# Patient Record
Sex: Male | Born: 1945
Health system: Southern US, Community
[De-identification: ages and names within clinical notes are randomized; demographics above are authoritative.]

## PROBLEM LIST (undated history)

## (undated) DIAGNOSIS — I4891 Unspecified atrial fibrillation: Secondary | ICD-10-CM

## (undated) DIAGNOSIS — Z8739 Personal history of other diseases of the musculoskeletal system and connective tissue: Secondary | ICD-10-CM

## (undated) DIAGNOSIS — F1011 Alcohol abuse, in remission: Secondary | ICD-10-CM

## (undated) DIAGNOSIS — F32A Depression, unspecified: Secondary | ICD-10-CM

## (undated) DIAGNOSIS — I499 Cardiac arrhythmia, unspecified: Secondary | ICD-10-CM

## (undated) DIAGNOSIS — J309 Allergic rhinitis, unspecified: Secondary | ICD-10-CM

## (undated) HISTORY — DX: Unspecified atrial fibrillation: I48.91

## (undated) HISTORY — DX: Personal history of other diseases of the musculoskeletal system and connective tissue: Z87.39

## (undated) HISTORY — DX: Allergic rhinitis, unspecified: J30.9

## (undated) HISTORY — DX: Alcohol abuse, in remission: F10.11

---
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
## 1980-08-13 HISTORY — PX: INGUINAL HERNIA REPAIR: SUR1180

## 2013-08-13 HISTORY — PX: CATARACT EXTRACTION BILATERAL W/ ANTERIOR VITRECTOMY: SHX1304

## 2013-09-07 DIAGNOSIS — Z8679 Personal history of other diseases of the circulatory system: Secondary | ICD-10-CM | POA: Insufficient documentation

## 2015-05-18 DIAGNOSIS — N400 Enlarged prostate without lower urinary tract symptoms: Secondary | ICD-10-CM | POA: Insufficient documentation

## 2015-07-23 DIAGNOSIS — R4182 Altered mental status, unspecified: Secondary | ICD-10-CM | POA: Insufficient documentation

## 2015-07-25 DIAGNOSIS — G9340 Encephalopathy, unspecified: Secondary | ICD-10-CM | POA: Insufficient documentation

## 2015-08-09 DIAGNOSIS — J90 Pleural effusion, not elsewhere classified: Secondary | ICD-10-CM | POA: Insufficient documentation

## 2015-08-09 DIAGNOSIS — E8770 Fluid overload, unspecified: Secondary | ICD-10-CM | POA: Insufficient documentation

## 2015-08-09 DIAGNOSIS — D500 Iron deficiency anemia secondary to blood loss (chronic): Secondary | ICD-10-CM | POA: Insufficient documentation

## 2015-12-05 ENCOUNTER — Telehealth: Payer: Self-pay | Admitting: Cardiology

## 2015-12-05 NOTE — Telephone Encounter (Signed)
Patient came to office and signed Release to obtain records from [facility] for upcoming appointment with Dr Antoine PocheHochrein.  Faxed to [phone] on 12/05/15. lp

## 2015-12-22 ENCOUNTER — Ambulatory Visit (INDEPENDENT_AMBULATORY_CARE_PROVIDER_SITE_OTHER): Payer: Medicare Other | Admitting: Family Medicine

## 2015-12-22 ENCOUNTER — Encounter: Payer: Self-pay | Admitting: Family Medicine

## 2015-12-22 VITALS — BP 107/66 | HR 79 | Temp 98.4°F | Ht 69.3 in | Wt 154.2 lb

## 2015-12-22 DIAGNOSIS — J309 Allergic rhinitis, unspecified: Secondary | ICD-10-CM | POA: Diagnosis not present

## 2015-12-22 DIAGNOSIS — Z0000 Encounter for general adult medical examination without abnormal findings: Secondary | ICD-10-CM | POA: Diagnosis not present

## 2015-12-22 DIAGNOSIS — I4891 Unspecified atrial fibrillation: Secondary | ICD-10-CM

## 2015-12-22 DIAGNOSIS — Z8739 Personal history of other diseases of the musculoskeletal system and connective tissue: Secondary | ICD-10-CM | POA: Diagnosis not present

## 2015-12-22 DIAGNOSIS — I10 Essential (primary) hypertension: Secondary | ICD-10-CM | POA: Insufficient documentation

## 2015-12-22 MED ORDER — LEVOCETIRIZINE DIHYDROCHLORIDE 5 MG PO TABS: 5.0000 mg | ORAL_TABLET | Freq: Every evening | ORAL | Status: DC

## 2015-12-22 MED ORDER — DIGOXIN 125 MCG PO TABS: 125.0000 ug | ORAL_TABLET | Freq: Every day | ORAL | Status: DC

## 2015-12-22 MED ORDER — ASPIRIN EC 81 MG PO TBEC: 81.0000 mg | DELAYED_RELEASE_TABLET | Freq: Every day | ORAL | Status: DC

## 2015-12-22 NOTE — Progress Notes (Signed)
Subjective:  Cameron Patton is a 70 y.o. male who presents to the [facility] today to establish care. He has a chief complaint of rhinorrhea and left ear "pressure."   HPI:  Rhinorrhea Present for several months. Has a history of allergies to grass. Has not tried any medications. Tried flonase in the past and did not like it. Does not want to start it again.  Atrial Fibrillation Diagnosed in 1998. Has been on digoxin and warfarin in the past but recently stopped warfarin 7 months ago. Has not been on aspirin. No history of stroke or heart failure.  Arthritis / Degenerative Disc Disease Located in his back. Unsure of location. Takes tylenol as needed.   Social Relocated to [place] from [place] 4 months ago after significant hospital stay with UTI leading to sepsis. Went to rehab for a few months afterwards. Brother is living in [place].   ROS: All systems reviewed and are negative  PMH:  The following were reviewed and entered/updated in epic: Past Medical History  Diagnosis Date  . Atrial fibrillation (HCC)   . Hx of degenerative disc disease   . Allergic rhinitis    Patient Active Problem List   Diagnosis Date Noted  . Hx of degenerative disc disease 12/22/2015  . Atrial fibrillation (HCC) 12/22/2015  . Allergic rhinitis 12/22/2015  . Healthcare maintenance 12/22/2015   Past Surgical History  Procedure Laterality Date  . Cataract extraction bilateral w/ anterior vitrectomy Bilateral 2015  . Inguinal hernia repair Right 1982    Family History  Problem Relation Age of Onset  . Stroke Brother     Medications- reviewed and updated Current Outpatient Prescriptions  Medication Sig Dispense Refill  . aspirin EC 81 MG tablet Take 1 tablet (81 mg total) by mouth daily.    . digoxin (LANOXIN) 0.125 MG tablet Take 1 tablet (125 mcg total) by mouth daily.    Marland Kitchen levocetirizine (XYZAL) 5 MG tablet Take 1 tablet (5 mg total) by mouth every evening. 30 tablet 5   No current  facility-administered medications for this visit.   Allergies-reviewed and updated No Known Allergies  Social History   Social History  . Marital Status: Legally Separated    Spouse Name: N/A  . Number of Children: N/A  . Years of Education: N/A   Social History Main Topics  . Smoking status: Never Smoker   . Smokeless tobacco: None  . Alcohol Use: None  . Drug Use: None  . Sexual Activity: Not Asked   Other Topics Concern  . None   Social History Narrative   Relocated to [place] from [place] 08/2015 months ago after significant hospital stay with UTI leading to sepsis. Went to rehab for a few months afterwards. Brother is living in [place].      Objective:  Physical Exam: BP 107/66 mmHg  Pulse 79  Temp(Src) 98.4 F (36.9 C) (Oral)  Ht 5' 9.3" (1.76 m)  Wt 154 lb 3.2 oz (69.945 kg)  BMI 22.58 kg/m2  SpO2 100%  Gen: NAD, resting comfortably HEENT: Scant amount of clear mucus in nares bilaterally, TMs clear bilaterally CV: Irregularly irregular, no murmurs appreciated Pulm: NWOB, CTAB with no crackles, wheezes, or rhonchi GI: Normal bowel sounds present. Soft, Nontender, Nondistended. MSK: no edema, cyanosis, or clubbing noted Skin: warm, dry Neuro: grossly normal, moves all extremities Psych: Normal affect and thought content  Assessment/Plan:  Allergic rhinitis Patient deferred intranasal sprays. Will start xyzal.   Hx of degenerative disc disease Continue  tylenol as needed.  Atrial fibrillation (HCC) Rate controlled today. Chads2vasc score of 1. Instructed patient to start daily aspirin. He has not been on any antiplatelets or anticoagulation for the last 7 months. Will continue prior dose of digoxin until patient has follow up with cardiology.  Healthcare maintenance Will not check any labs today as patient reports recent blood work.   Also asked the patient to bring all medication bottles to next visit and requested that he obtain the records from  his old PCP for review. Will not refill any medications today as the patient does not have doses recorded and did not bring his medication bottles.   Will need lipids, A1c, CBC, CMP, HCV testing at next visit. May also be due for colonoscopy, pneumonia vaccine, tdap, and shingles vaccines. Will await records.   Katina Degreealeb M. Jimmey RalphParker, MD [facility] Resident PGY-2 12/22/2015 4:51 PM

## 2015-12-22 NOTE — Patient Instructions (Addendum)
We will start xyzal today for your runny nose. This will also help with your ear.   Please bring all of your pill bottles to your next visit.   Please ask your old doctor to send their records.  Please come back in 3 months or sooner if you need anything else.  Take care,  Dr Jimmey RalphParker

## 2015-12-22 NOTE — Assessment & Plan Note (Signed)
Rate controlled today. Chads2vasc score of 1. Instructed patient to start daily aspirin. He has not been on any antiplatelets or anticoagulation for the last 7 months. Will continue prior dose of digoxin until patient has follow up with cardiology.

## 2015-12-22 NOTE — Assessment & Plan Note (Signed)
Patient deferred intranasal sprays. Will start xyzal.

## 2015-12-22 NOTE — Assessment & Plan Note (Signed)
Will not check any labs today as patient reports recent blood work.   Also asked the patient to bring all medication bottles to next visit and requested that he obtain the records from his old PCP for review. Will not refill any medications today as the patient does not have doses recorded and did not bring his medication bottles.   Will need lipids, A1c, CBC, CMP, HCV testing at next visit. May also be due for colonoscopy, pneumonia vaccine, tdap, and shingles vaccines. Will await records.

## 2015-12-22 NOTE — Assessment & Plan Note (Signed)
Continue tylenol as needed.

## 2016-01-04 ENCOUNTER — Encounter: Payer: Self-pay | Admitting: Cardiology

## 2016-01-04 ENCOUNTER — Telehealth: Payer: Self-pay | Admitting: Cardiology

## 2016-01-04 NOTE — Telephone Encounter (Signed)
Received records from [facility] for appointment on 01/10/16 with Dr Antoine PocheHochrein.  [facility] for Dr Hochrein's schedule on 01/10/16. lp

## 2016-01-10 ENCOUNTER — Ambulatory Visit (INDEPENDENT_AMBULATORY_CARE_PROVIDER_SITE_OTHER): Payer: Medicare Other | Admitting: Cardiology

## 2016-01-10 ENCOUNTER — Encounter: Payer: Self-pay | Admitting: Cardiology

## 2016-01-10 VITALS — BP 98/66 | HR 79 | Ht 69.0 in | Wt 160.0 lb

## 2016-01-10 DIAGNOSIS — I4891 Unspecified atrial fibrillation: Secondary | ICD-10-CM | POA: Diagnosis not present

## 2016-01-10 DIAGNOSIS — I482 Chronic atrial fibrillation, unspecified: Secondary | ICD-10-CM

## 2016-01-10 NOTE — Patient Instructions (Signed)
Medication Instructions:  Continue Current Medications  Labwork: NONE  Testing/Procedures: NONE  Follow-Up: Your physician recommends that you schedule a follow-up appointment in: 1 Month   Any Other Special Instructions Will Be Listed Below (If Applicable).   If you need a refill on your cardiac medications before your next appointment, please call your pharmacy.

## 2016-01-10 NOTE — Progress Notes (Addendum)
Cardiology Office Note   Date:  01/10/2016   ID:  Cameron HopeSidney L Vangieson, DOB 1946-07-20, MRN [id number]  PCP:  Jacquiline Doealeb Parker, MD  Cardiologist:   Rollene RotundaJames Kyleigha Markert, MD   Chief Complaint  Patient presents with  . Atrial Fibrillation      History of Present Illness: Cameron Patton is a 70 y.o. male who presents for Evaluation of atrial fibrillation. The patient is moving from [place]. He has had a long history of atrial fibrillation. I did receive some records from his cardiologist there. He was on warfarin for years. There were no apparent complications with this. He was on pindolol in the past. He did have some moderate mitral regurgitation on echocardiogram in September of last year. He had biatrial enlargement. He was apparently hospitalized in December with sepsis from a urinary tract infection. This sounds like an exceptionally complicated hospitalization. I don't have any of the details of this. He was discharged to rehabilitation without anticoagulation been on only aspirin since then. He is now living in a nursing home here. His brother does live here. He is starting to do some of his own exercising having completed extensive physical therapy. He lost 40 pounds with his illness. He has weakness and some gait difficulties.  From a cardiac standpoint he denies any symptoms. The patient denies any new symptoms such as chest discomfort, neck or arm discomfort. There has been no new shortness of breath, PND or orthopnea. There have been no reported palpitations, presyncope or syncope.  He has not reported any falling. He does not have any weight gain or edema. Since being at his nursing home he's been taking digoxin. At some point he was actually told to stop taking aspirin. He doesn't have any bleeding issues. He doesn't know of any contraindication to anticoagulation.    Past Medical History  Diagnosis Date  . Atrial fibrillation (HCC)   . Hx of degenerative disc disease   . Allergic rhinitis    . History of ETOH abuse     Quit December last year.     Past Surgical History  Procedure Laterality Date  . Cataract extraction bilateral w/ anterior vitrectomy Bilateral 2015  . Inguinal hernia repair Right 1982     Current Outpatient Prescriptions  Medication Sig Dispense Refill  . aspirin EC 81 MG tablet Take 1 tablet (81 mg total) by mouth daily.    . digoxin (LANOXIN) 0.125 MG tablet Take 1 tablet (125 mcg total) by mouth daily.    Marland Kitchen. levocetirizine (XYZAL) 5 MG tablet Take 1 tablet (5 mg total) by mouth every evening. 30 tablet 5   No current facility-administered medications for this visit.    Allergies:   Review of patient's allergies indicates no known allergies.    Social History:  The patient  reports that he has never smoked. He does not have any smokeless tobacco history on file.   Family History:  The patient's family history includes Alcoholism in his mother; Cancer in his brother; Stroke in his brother.    ROS:  Please see the history of present illness.   Otherwise, review of systems are positive for none.   All other systems are reviewed and negative.    PHYSICAL EXAM: VS:  BP 98/66 mmHg  Pulse 79  Ht 5\' 9"  (1.753 m)  Wt 160 lb (72.576 kg)  BMI 23.62 kg/m2 , BMI Body mass index is 23.62 kg/(m^2). GENERAL:  Somewhat frail appearing but in no distress HEENT:  Pupils  equal round and reactive, fundi not visualized, oral mucosa unremarkable NECK:  No jugular venous distention, waveform within normal limits, carotid upstroke brisk and symmetric, no bruits, no thyromegaly LYMPHATICS:  No cervical, inguinal adenopathy LUNGS:  Clear to auscultation bilaterally BACK:  No CVA tenderness CHEST:  Unremarkable HEART:  PMI not displaced or sustained,S1 and S2 within normal limits, no S3, no clicks, no rubs, soft short apical systolic murmur, no diastolic murmurs, irregular ABD:  Flat, positive bowel sounds normal in frequency in pitch, no bruits, no rebound, no  guarding, no midline pulsatile mass, no hepatomegaly, no splenomegaly EXT:  2 plus pulses throughout, no edema, no cyanosis no clubbing SKIN:  No rashes no nodules NEURO:  Cranial nerves II through XII grossly intact, motor grossly intact throughout PSYCH:  Cognitively intact, oriented to person place and time    EKG:  EKG is ordered today. The ekg ordered today demonstrates atrial fibrillation, rate 79, axis within normal limits, intervals within normal limits, poor anterior right progression.   Recent Labs: No results found for requested labs within last 365 days.    Lipid Panel No results found for: CHOL, TRIG, HDL, CHOLHDL, VLDL, LDLCALC, LDLDIRECT    Wt Readings from Last 3 Encounters:  01/10/16 160 lb (72.576 kg)  12/22/15 154 lb 3.2 oz (69.945 kg)      Other studies Reviewed: Additional studies/ records that were reviewed today include: Cardiology office records ([place]). Review of the above records demonstrates:  Please see elsewhere in the note.     ASSESSMENT AND PLAN:  ATRIAL FIB:  Cameron Patton has a CHA2DS2 - VASc score of 1 with a risk of stroke of 1.3%.   He and I will have further discussions about indications for anticoagulation.  I would like to see more of his past medical history to understand any contraindications to anticoagulation or other thromboembolic risk that might alter his score. Apparently  Jacquiline Doe, MD has requested records and I will wait to see if these get scanned in and schedule the patient to be seen in follow-up prior to making any medication changes. For now he can remain on the meds as listed.   INSOMNIA: He said this is a significant issue. I will send a message to his primary provider to see if he will renew his Ambien or consider other therapy. He says over-the-counter therapy is not helping.    MR:  I suspect that this is mild and I will follow up with repeat echo in the future.    Current medicines are reviewed at length  with the patient today.  The patient does not have concerns regarding medicines.  The following changes have been made:  no change  Labs/ tests ordered today include:   Orders Placed This Encounter  Procedures  . EKG 12-Lead     Disposition:   FU with me in one month.      Signed, Rollene Rotunda, MD  01/10/2016 5:12 PM    [facility]

## 2016-02-08 NOTE — Progress Notes (Signed)
Cardiology Office Note   Date:  02/09/2016   ID:  Cameron Patton, DOB Jun 26, 1946, MRN [id number]  PCP:  Cameron Doealeb Parker, MD  Cardiologist:   Cameron RotundaJames Milena Liggett, MD   Chief Complaint  Patient presents with  . Atrial Fibrillation      History of Present Illness: Cameron Patton is a 70 y.o. male who presents for Evaluation of atrial fibrillation. The patient is moving from [place]. He has had a long history of atrial fibrillation. I did receive some records from his cardiologist there. He was on warfarin for years. There were no apparent complications with this. He was on pindolol in the past. He did have some moderate mitral regurgitation on echocardiogram in September of last year. He had biatrial enlargement. He was apparently hospitalized in December with sepsis from a urinary tract infection.   This was a complicated history.  He was in the hospital at the end of last year. He came out of the hospital without his chronic warfarin which been taking under the care of his cardiologist. He doesn't recall the details and doesn't know that there was any bleeding contraindication. I requested these records. However, I did not get these yet.  Since I last saw him he has done well.  The patient denies any new symptoms such as chest discomfort, neck or arm discomfort. There has been no new shortness of breath, PND or orthopnea. There have been no reported palpitations, presyncope or syncope.  He is working out at [employer].     Past Medical History  Diagnosis Date  . Atrial fibrillation (HCC)   . Hx of degenerative disc disease   . Allergic rhinitis   . History of ETOH abuse     Quit December last year.     Past Surgical History  Procedure Laterality Date  . Cataract extraction bilateral w/ anterior vitrectomy Bilateral 2015  . Inguinal hernia repair Right 1982     Current Outpatient Prescriptions  Medication Sig Dispense Refill  . aspirin EC 81 MG tablet Take 1 tablet (81 mg total)  by mouth daily.    . digoxin (LANOXIN) 0.125 MG tablet Take 1 tablet (125 mcg total) by mouth daily.    Marland Kitchen. levocetirizine (XYZAL) 5 MG tablet Take 1 tablet (5 mg total) by mouth every evening. 30 tablet 5  . rivaroxaban (XARELTO) 20 MG TABS tablet Take 1 tablet (20 mg total) by mouth daily with supper. 30 tablet 11   No current facility-administered medications for this visit.    Allergies:   Review of patient's allergies indicates no known allergies.     ROS:  Please see the history of present illness.   Otherwise, review of systems are positive for none.   All other systems are reviewed and negative.    PHYSICAL EXAM: VS:  BP 122/76 mmHg  Pulse 65  Ht 5\' 9"  (1.753 m)  Wt 155 lb (70.308 kg)  BMI 22.88 kg/m2 , BMI Body mass index is 22.88 kg/(m^2). GENERAL:  Somewhat frail appearing but in no distress NECK:  No jugular venous distention, waveform within normal limits, carotid upstroke brisk and symmetric, no bruits, no thyromegaly LUNGS:  Clear to auscultation bilaterally BACK:  No CVA tenderness CHEST:  Unremarkable HEART:  PMI not displaced or sustained,S1 and S2 within normal limits, no S3, no clicks, no rubs, soft short apical systolic murmur, no diastolic murmurs, irregular ABD:  Flat, positive bowel sounds normal in frequency in pitch, no bruits, no rebound, no  guarding, no midline pulsatile mass, no hepatomegaly, no splenomegaly EXT:  2 plus pulses throughout, no edema, no cyanosis no clubbing PSYCH:  Cognitively intact, oriented to person place and time, tearful   EKG:  EKG is not ordered today.   Recent Labs: No results found for requested labs within last 365 days.    Lipid Panel No results found for: CHOL, TRIG, HDL, CHOLHDL, VLDL, LDLCALC, LDLDIRECT    Wt Readings from Last 3 Encounters:  02/09/16 155 lb (70.308 kg)  01/10/16 160 lb (72.576 kg)  12/22/15 154 lb 3.2 oz (69.945 kg)      Other studies Reviewed: Additional studies/ records that were reviewed  today include:  None Review of the above records demonstrates:     ASSESSMENT AND PLAN:  ATRIAL FIB:  [name] has a CHA2DS2 - VASc score of 1 with a risk of stroke of 1.3%. I did not get the records from his hospitalization. We had a discussion about the risks benefits of anticoagulation and there is certainly room for patient preference and shared decision making year. He would prefer blood thinners as well although cost is a consideration. I'll write a prescription for Xarelto and we can see if this is affordable for him.  INSOMNIA: He is clearly depressed about his brother and living in [place].  He doesn't like his living situation.  This is probably affecting sleep. I have asked him to get an appointment with his primary provider to discuss this.  MR:  I suspect that this is mild and I will follow up with repeat echo in Dec.    Current medicines are reviewed at length with the patient today.  The patient does not have concerns regarding medicines.  The following changes have been made:  no change  Labs/ tests ordered today include:   Orders Placed This Encounter  Procedures  . ECHOCARDIOGRAM COMPLETE     Disposition:   FU with me in Dec.      Signed, Cameron RotundaJames Ladarion Munyon, MD  02/09/2016 5:32 PM    [facility]

## 2016-02-09 ENCOUNTER — Ambulatory Visit (INDEPENDENT_AMBULATORY_CARE_PROVIDER_SITE_OTHER): Payer: Medicare Other | Admitting: Cardiology

## 2016-02-09 ENCOUNTER — Encounter: Payer: Self-pay | Admitting: Cardiology

## 2016-02-09 ENCOUNTER — Telehealth: Payer: Self-pay | Admitting: Cardiology

## 2016-02-09 VITALS — BP 122/76 | HR 65 | Ht 69.0 in | Wt 155.0 lb

## 2016-02-09 DIAGNOSIS — I482 Chronic atrial fibrillation, unspecified: Secondary | ICD-10-CM

## 2016-02-09 MED ORDER — RIVAROXABAN 20 MG PO TABS: 20.0000 mg | ORAL_TABLET | Freq: Every day | ORAL | Status: DC

## 2016-02-09 NOTE — Telephone Encounter (Signed)
F/u Message   pt states he received a call and was returning the call. Please call back to discuss

## 2016-02-09 NOTE — Patient Instructions (Signed)
Medication Instructions:  START Xarelto 20 mg daily  Labwork: NONE  Testing/Procedures: Your physician has requested that you have an echocardiogram in December. Echocardiography is a painless test that uses sound waves to create images of your heart. It provides your doctor with information about the size and shape of your heart and how well your heart's chambers and valves are working. This procedure takes approximately one hour. There are no restrictions for this procedure.  Follow-Up: Your physician wants you to follow-up in: December after Echo. You will receive a reminder letter in the mail two months in advance. If you don't receive a letter, please call our office to schedule the follow-up appointment.   Any Other Special Instructions Will Be Listed Below (If Applicable).   If you need a refill on your cardiac medications before your next appointment, please call your pharmacy.

## 2016-02-09 NOTE — Telephone Encounter (Signed)
Faxed [facility] per Dr Antoine PocheHochrein.  Faxed 02/09/16 to [phone].

## 2016-02-09 NOTE — Telephone Encounter (Signed)
Spoke with patient. Apologized that I was unable to locate who may have tried to reach out to him. He has an MD appointment today 02/08/17 @ 2:30pm of which he is aware.

## 2016-02-21 NOTE — Telephone Encounter (Signed)
Left message to call back  

## 2016-02-21 NOTE — Telephone Encounter (Signed)
New message   Pt wants Dr.hochrein to change his current blood thinner Xarelto prescription to Warfrin to 3 mg   It is cheaper at the pharmacy  [place] [phone]

## 2016-02-22 ENCOUNTER — Telehealth: Payer: Self-pay

## 2016-02-22 NOTE — Telephone Encounter (Signed)
Left message to call back  

## 2016-02-22 NOTE — Telephone Encounter (Signed)
Patient called about getting a new prescription for previous blood thinner warfarin 3 mg.  Hochrein recently changed it to xarelto but the patient states that it is too expensvie.  [facility] would [facility]. Patient will call back Thursday 7/13.

## 2016-02-23 MED ORDER — WARFARIN SODIUM 3 MG PO TABS: 3.0000 mg | ORAL_TABLET | Freq: Every day | ORAL | Status: DC

## 2016-02-23 NOTE — Telephone Encounter (Signed)
Routed to clinical pharmacy staff for assistance

## 2016-02-23 NOTE — Telephone Encounter (Signed)
Another telephone note has been started concerning same issues and routed to clinical pharmacists. This encounter will be closed.

## 2016-02-23 NOTE — Telephone Encounter (Signed)
LMOM for patient - need to set up INR visit or determine who is going to follow him.

## 2016-02-28 ENCOUNTER — Ambulatory Visit (INDEPENDENT_AMBULATORY_CARE_PROVIDER_SITE_OTHER): Payer: Medicare Other | Admitting: Pharmacist Clinician (PhC)/ Clinical Pharmacy Specialist

## 2016-02-28 DIAGNOSIS — Z7901 Long term (current) use of anticoagulants: Secondary | ICD-10-CM

## 2016-02-28 DIAGNOSIS — I4891 Unspecified atrial fibrillation: Secondary | ICD-10-CM | POA: Diagnosis not present

## 2016-02-28 LAB — POCT INR: INR: 1.3

## 2016-03-14 ENCOUNTER — Ambulatory Visit (INDEPENDENT_AMBULATORY_CARE_PROVIDER_SITE_OTHER): Payer: Medicare Other | Admitting: Pharmacist Clinician (PhC)/ Clinical Pharmacy Specialist

## 2016-03-14 DIAGNOSIS — Z7901 Long term (current) use of anticoagulants: Secondary | ICD-10-CM

## 2016-03-14 DIAGNOSIS — I4891 Unspecified atrial fibrillation: Secondary | ICD-10-CM | POA: Diagnosis not present

## 2016-03-14 LAB — POCT INR: INR: 4.3

## 2016-04-11 ENCOUNTER — Ambulatory Visit (INDEPENDENT_AMBULATORY_CARE_PROVIDER_SITE_OTHER): Payer: Medicare Other | Admitting: Pharmacist Clinician (PhC)/ Clinical Pharmacy Specialist

## 2016-04-11 DIAGNOSIS — Z7901 Long term (current) use of anticoagulants: Secondary | ICD-10-CM | POA: Diagnosis not present

## 2016-04-11 DIAGNOSIS — I4891 Unspecified atrial fibrillation: Secondary | ICD-10-CM

## 2016-04-11 LAB — POCT INR: INR: 2

## 2016-04-26 ENCOUNTER — Other Ambulatory Visit: Payer: Self-pay | Admitting: *Deleted

## 2016-04-26 MED ORDER — WARFARIN SODIUM 3 MG PO TABS: ORAL_TABLET | ORAL | 1 refills | Status: DC

## 2016-05-09 ENCOUNTER — Ambulatory Visit (INDEPENDENT_AMBULATORY_CARE_PROVIDER_SITE_OTHER): Payer: Medicare Other | Admitting: Pharmacist Clinician (PhC)/ Clinical Pharmacy Specialist

## 2016-05-09 DIAGNOSIS — Z7901 Long term (current) use of anticoagulants: Secondary | ICD-10-CM

## 2016-05-09 DIAGNOSIS — I4891 Unspecified atrial fibrillation: Secondary | ICD-10-CM

## 2016-05-09 LAB — POCT INR: INR: 3.3

## 2016-06-08 ENCOUNTER — Ambulatory Visit (INDEPENDENT_AMBULATORY_CARE_PROVIDER_SITE_OTHER): Payer: Medicare Other | Admitting: Pharmacist Clinician (PhC)/ Clinical Pharmacy Specialist

## 2016-06-08 DIAGNOSIS — Z7901 Long term (current) use of anticoagulants: Secondary | ICD-10-CM | POA: Diagnosis not present

## 2016-06-08 DIAGNOSIS — I4891 Unspecified atrial fibrillation: Secondary | ICD-10-CM | POA: Diagnosis not present

## 2016-06-08 LAB — POCT INR: INR: 3.7

## 2016-07-09 ENCOUNTER — Ambulatory Visit (INDEPENDENT_AMBULATORY_CARE_PROVIDER_SITE_OTHER): Payer: Medicare Other | Admitting: Pharmacist Clinician (PhC)/ Clinical Pharmacy Specialist

## 2016-07-09 DIAGNOSIS — Z7901 Long term (current) use of anticoagulants: Secondary | ICD-10-CM

## 2016-07-09 DIAGNOSIS — I4891 Unspecified atrial fibrillation: Secondary | ICD-10-CM

## 2016-07-09 LAB — POCT INR: INR: 3.4

## 2016-07-24 ENCOUNTER — Other Ambulatory Visit: Payer: Self-pay | Admitting: Cardiology

## 2016-08-16 ENCOUNTER — Ambulatory Visit (HOSPITAL_COMMUNITY): Payer: Medicare Other | Attending: Cardiovascular Disease

## 2016-08-16 ENCOUNTER — Other Ambulatory Visit: Payer: Self-pay

## 2016-08-16 DIAGNOSIS — I517 Cardiomegaly: Secondary | ICD-10-CM | POA: Insufficient documentation

## 2016-08-16 DIAGNOSIS — I482 Chronic atrial fibrillation, unspecified: Secondary | ICD-10-CM

## 2016-08-20 ENCOUNTER — Ambulatory Visit (INDEPENDENT_AMBULATORY_CARE_PROVIDER_SITE_OTHER): Payer: Medicare Other | Admitting: Pharmacist Clinician (PhC)/ Clinical Pharmacy Specialist

## 2016-08-20 ENCOUNTER — Telehealth: Payer: Self-pay | Admitting: Cardiology

## 2016-08-20 DIAGNOSIS — Z7901 Long term (current) use of anticoagulants: Secondary | ICD-10-CM

## 2016-08-20 DIAGNOSIS — I4891 Unspecified atrial fibrillation: Secondary | ICD-10-CM

## 2016-08-20 LAB — POCT INR: INR: 2.6

## 2016-08-20 NOTE — Telephone Encounter (Signed)
Patient was seen in office today.  Did not call him after that.  Patient aware that someone LM on his VM, but he wasn't sure whom.

## 2016-08-20 NOTE — Telephone Encounter (Signed)
New Message ° ° ° °Returning your call  °

## 2016-09-19 ENCOUNTER — Other Ambulatory Visit: Payer: Self-pay | Admitting: Cardiology

## 2016-10-05 ENCOUNTER — Ambulatory Visit (INDEPENDENT_AMBULATORY_CARE_PROVIDER_SITE_OTHER): Payer: Medicare Other | Admitting: Pharmacist

## 2016-10-05 DIAGNOSIS — Z7901 Long term (current) use of anticoagulants: Secondary | ICD-10-CM

## 2016-10-05 DIAGNOSIS — I4891 Unspecified atrial fibrillation: Secondary | ICD-10-CM

## 2016-10-05 LAB — POCT INR: INR: 2.1

## 2016-11-08 ENCOUNTER — Other Ambulatory Visit: Payer: Self-pay | Admitting: Cardiology

## 2016-11-16 ENCOUNTER — Ambulatory Visit (INDEPENDENT_AMBULATORY_CARE_PROVIDER_SITE_OTHER): Payer: Medicare Other | Admitting: Pharmacist

## 2016-11-16 DIAGNOSIS — I4891 Unspecified atrial fibrillation: Secondary | ICD-10-CM

## 2016-11-16 DIAGNOSIS — Z7901 Long term (current) use of anticoagulants: Secondary | ICD-10-CM | POA: Diagnosis not present

## 2016-11-16 LAB — POCT INR: INR: 2.3

## 2017-01-16 ENCOUNTER — Ambulatory Visit (INDEPENDENT_AMBULATORY_CARE_PROVIDER_SITE_OTHER): Payer: Medicare Other | Admitting: Pharmacist Clinician (PhC)/ Clinical Pharmacy Specialist

## 2017-01-16 DIAGNOSIS — I4891 Unspecified atrial fibrillation: Secondary | ICD-10-CM

## 2017-01-16 DIAGNOSIS — Z7901 Long term (current) use of anticoagulants: Secondary | ICD-10-CM | POA: Diagnosis not present

## 2017-01-16 LAB — POCT INR: INR: 1.5

## 2017-01-18 ENCOUNTER — Other Ambulatory Visit: Payer: Self-pay | Admitting: *Deleted

## 2017-01-18 ENCOUNTER — Other Ambulatory Visit: Payer: Self-pay | Admitting: Cardiology

## 2017-01-18 NOTE — Telephone Encounter (Signed)
Rx(s) sent to pharmacy electronically.  

## 2017-01-21 ENCOUNTER — Telehealth: Payer: Self-pay | Admitting: Cardiology

## 2017-01-21 NOTE — Telephone Encounter (Signed)
error 

## 2017-01-22 ENCOUNTER — Encounter: Payer: Self-pay | Admitting: Physician Assistant

## 2017-01-22 ENCOUNTER — Ambulatory Visit (INDEPENDENT_AMBULATORY_CARE_PROVIDER_SITE_OTHER): Payer: Medicare Other | Admitting: Physician Assistant

## 2017-01-22 ENCOUNTER — Other Ambulatory Visit: Payer: Self-pay | Admitting: Family Medicine

## 2017-01-22 VITALS — BP 104/66 | HR 61 | Ht 69.0 in | Wt 176.0 lb

## 2017-01-22 DIAGNOSIS — I482 Chronic atrial fibrillation: Secondary | ICD-10-CM | POA: Diagnosis not present

## 2017-01-22 DIAGNOSIS — I4821 Permanent atrial fibrillation: Secondary | ICD-10-CM

## 2017-01-22 DIAGNOSIS — Z7901 Long term (current) use of anticoagulants: Secondary | ICD-10-CM

## 2017-01-22 MED ORDER — DIGOXIN 125 MCG PO TABS
125.0000 ug | ORAL_TABLET | Freq: Every day | ORAL | 11 refills | Status: DC
Start: 2017-01-22 — End: 2019-07-15

## 2017-01-22 NOTE — Patient Instructions (Signed)
Medication Instructions: No changes    Follow-Up: Your physician wants you to follow-up in: one year with Dr. Hochrein. You will receive a reminder letter in the mail two months in advance. If you don't receive a letter, please call our office to schedule the follow-up appointment.   If you need a refill on your cardiac medications before your next appointment, please call your pharmacy.   

## 2017-01-22 NOTE — Progress Notes (Signed)
Cardiology Office Note   Date:  01/22/2017   ID:  Cameron Patton, DOB September 23, 1945, MRN [id number]  PCP:  Ardith DarkParker, Caleb M, MD  Cardiologist:  Dr. Antoine PocheHochrein, 02/08/2016  Theodore DemarkBarrett, Sharena Dibenedetto, PA-C   Chief Complaint  Patient presents with  . Medication Refill    History of Present Illness: Cameron Patton is a 71 y.o. 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 Pt was on warfarin  Cameron HopeSidney L Freilich presents for cardiology follow up.  He is rarely aware of the atrial fib. He only notices it occasionally at night when he puts his hand on his chest.   He exercises regularly at [employer], walks occasionally as well.  He never gets chest pain or SOB with exertion. He does not check his heart rate with exertion.   He has not had presyncope or syncope.  He does not get LE edema, orthopnea or PND.  He follows at the coumadin clinic here, his INR was low recently, but normally is stable at 2-3.   He drinks 5-6 beers most days of the week. He recognizes the need to decrease the amount/frequency.   Past Medical History:  Diagnosis Date  . Allergic rhinitis   . Atrial fibrillation (HCC)   . History of ETOH abuse    Quit December last year.   Marland Kitchen. Hx of degenerative disc disease     Past Surgical History:  Procedure Laterality Date  . CATARACT EXTRACTION BILATERAL W/ ANTERIOR VITRECTOMY Bilateral 2015  . INGUINAL HERNIA REPAIR Right 1982    Current Outpatient Prescriptions  Medication Sig Dispense Refill  . digoxin (DIGOX) 0.125 MG tablet Take 1 tablet (125 mcg total) by mouth daily. PLEASE CONTACT OFFICE FOR ADDITIONAL REFILLS 15 tablet 0  . warfarin (COUMADIN) 3 MG tablet Take 1/2 to 1 tablet by mouth daily as directed by coumadin clinic 90 tablet 1   No current facility-administered medications for this visit.     Allergies:   Patient has no known allergies.    Social History:  The patient  reports that he has never  smoked. He has never used smokeless tobacco.   Family History:  The patient's family history includes Alcoholism in his mother; Cancer in his brother; Stroke in his brother.    ROS:  Please see the history of present illness. All other systems are reviewed and negative.    PHYSICAL EXAM: VS:  BP 104/66   Pulse 61   Ht 5\' 9"  (1.753 m)   Wt 176 lb (79.8 kg)   BMI 25.99 kg/m  , BMI Body mass index is 25.99 kg/m. GEN: Well nourished, well developed, male in no acute distress  HEENT: normal for age  Neck: no JVD, no carotid bruit, no masses Cardiac: Irreg R&R; soft murmur, no rubs, or gallops Respiratory:  clear to auscultation bilaterally, normal work of breathing GI: soft, nontender, nondistended, + BS MS: no deformity or atrophy; no edema; distal pulses are 2+ in all 4 extremities   Skin: warm and Patton, no rash Neuro:  Strength and sensation are intact Psych: euthymic mood, full affect   EKG:  EKG is ordered today. The ekg ordered today demonstrates atrial fibrillation, controlled ventricular rate at 61. No acute ischemic changes, normal intervals  ECHO: 08/16/2016 - Left ventricle: The cavity size was normal. There was mild   concentric hypertrophy. Systolic function was normal. The   estimated ejection fraction was in  the range of 55% to 60%. Wall   motion was normal; there were no regional wall motion   abnormalities. Left ventricular diastolic function parameters   were normal. - Mitral valve: There was mild regurgitation. - Left atrium: The atrium was severely dilated. - Right ventricle: The cavity size was normal. Wall thickness was   normal. Systolic function was normal. - Right atrium: The atrium was moderately dilated. - Atrial septum: No defect or patent foramen ovale was identified   by color flow Doppler. - Tricuspid valve: There was mild regurgitation. - Pulmonary arteries: Systolic pressure was within the normal   range. PA peak pressure: 32 mm Hg  (S).  Recent Labs: No results found for requested labs within last 8760 hours.    Lipid Panel No results found for: CHOL, TRIG, HDL, CHOLHDL, VLDL, LDLCALC, LDLDIRECT   Wt Readings from Last 3 Encounters:  01/22/17 176 lb (79.8 kg)  02/09/16 155 lb (70.3 kg)  01/10/16 160 lb (72.6 kg)     Other studies Reviewed: Additional studies/ records that were reviewed today include: Office notes and testing.  ASSESSMENT AND PLAN:  1.  Permanent atrial fibrillation: His rate is controlled. He is having no symptoms indicating any side effects from digoxin. Continue current therapy.  2. Chronic anticoagulation with warfarin: Continue this. He is having no bleeding issues. This is managed by the Coumadin clinic here.   Current medicines are reviewed at length with the patient today.  The patient does not have concerns regarding medicines.  The following changes have been made:  no change  Labs/ tests ordered today include:  No orders of the defined types were placed in this encounter.    Disposition:   FU with Dr. Antoine Poche in a year  Signed, Leanna Battles  01/22/2017 11:52 AM    [facility] Phone: [phone]; Fax: [phone]  This note was written with the assistance of speech recognition software. Please excuse any transcriptional errors.

## 2017-02-27 ENCOUNTER — Ambulatory Visit (INDEPENDENT_AMBULATORY_CARE_PROVIDER_SITE_OTHER): Payer: Medicare Other | Admitting: Pharmacist

## 2017-02-27 DIAGNOSIS — I4891 Unspecified atrial fibrillation: Secondary | ICD-10-CM

## 2017-02-27 DIAGNOSIS — Z7901 Long term (current) use of anticoagulants: Secondary | ICD-10-CM | POA: Diagnosis not present

## 2017-02-27 LAB — POCT INR: INR: 1.7

## 2017-04-17 ENCOUNTER — Ambulatory Visit (INDEPENDENT_AMBULATORY_CARE_PROVIDER_SITE_OTHER): Payer: Medicare Other | Admitting: Pharmacist

## 2017-04-17 DIAGNOSIS — Z7901 Long term (current) use of anticoagulants: Secondary | ICD-10-CM | POA: Diagnosis not present

## 2017-04-17 DIAGNOSIS — I4891 Unspecified atrial fibrillation: Secondary | ICD-10-CM | POA: Diagnosis not present

## 2017-04-17 LAB — POCT INR: INR: 3.8

## 2017-06-05 ENCOUNTER — Ambulatory Visit (INDEPENDENT_AMBULATORY_CARE_PROVIDER_SITE_OTHER): Payer: Medicare Other | Admitting: Pharmacist Clinician (PhC)/ Clinical Pharmacy Specialist

## 2017-06-05 DIAGNOSIS — Z7901 Long term (current) use of anticoagulants: Secondary | ICD-10-CM

## 2017-06-05 DIAGNOSIS — I482 Chronic atrial fibrillation, unspecified: Secondary | ICD-10-CM

## 2017-06-05 DIAGNOSIS — I4891 Unspecified atrial fibrillation: Secondary | ICD-10-CM

## 2017-06-05 LAB — POCT INR: INR: 2

## 2017-07-12 ENCOUNTER — Other Ambulatory Visit: Payer: Self-pay | Admitting: Cardiology

## 2017-07-29 ENCOUNTER — Ambulatory Visit (INDEPENDENT_AMBULATORY_CARE_PROVIDER_SITE_OTHER): Payer: Medicare Other | Admitting: Pharmacist

## 2017-07-29 DIAGNOSIS — Z7901 Long term (current) use of anticoagulants: Secondary | ICD-10-CM | POA: Diagnosis not present

## 2017-07-29 DIAGNOSIS — I4891 Unspecified atrial fibrillation: Secondary | ICD-10-CM

## 2017-07-29 LAB — POCT INR: INR: 2.7

## 2017-09-23 ENCOUNTER — Telehealth: Payer: Self-pay | Admitting: Cardiology

## 2017-09-23 ENCOUNTER — Ambulatory Visit (INDEPENDENT_AMBULATORY_CARE_PROVIDER_SITE_OTHER): Payer: Medicare Other | Admitting: Pharmacist Clinician (PhC)/ Clinical Pharmacy Specialist

## 2017-09-23 DIAGNOSIS — Z7901 Long term (current) use of anticoagulants: Secondary | ICD-10-CM | POA: Diagnosis not present

## 2017-09-23 DIAGNOSIS — I482 Chronic atrial fibrillation, unspecified: Secondary | ICD-10-CM

## 2017-09-23 DIAGNOSIS — I4891 Unspecified atrial fibrillation: Secondary | ICD-10-CM

## 2017-09-23 LAB — POCT INR: INR: 6.8

## 2017-09-23 NOTE — Telephone Encounter (Signed)
Incoming call from [facility] with a critical INR at 7.2. Pharmd has been made aware.

## 2017-09-23 NOTE — Telephone Encounter (Signed)
[facility] is calling with a Critical Labs .  Please call   Thanks

## 2017-09-24 LAB — PROTIME-INR
INR: 7.2 (ref 0.8–1.2)
Prothrombin Time: 76.3 s — ABNORMAL HIGH (ref 9.1–12.0)

## 2017-09-27 ENCOUNTER — Ambulatory Visit (INDEPENDENT_AMBULATORY_CARE_PROVIDER_SITE_OTHER): Payer: Medicare Other | Admitting: Pharmacist

## 2017-09-27 DIAGNOSIS — I4891 Unspecified atrial fibrillation: Secondary | ICD-10-CM | POA: Diagnosis not present

## 2017-09-27 DIAGNOSIS — Z7901 Long term (current) use of anticoagulants: Secondary | ICD-10-CM | POA: Diagnosis not present

## 2017-09-27 DIAGNOSIS — I4821 Permanent atrial fibrillation: Secondary | ICD-10-CM

## 2017-09-27 LAB — POCT INR: INR: 1.8

## 2017-10-07 ENCOUNTER — Ambulatory Visit (INDEPENDENT_AMBULATORY_CARE_PROVIDER_SITE_OTHER): Payer: Medicare Other | Admitting: Pharmacist Clinician (PhC)/ Clinical Pharmacy Specialist

## 2017-10-07 DIAGNOSIS — Z7901 Long term (current) use of anticoagulants: Secondary | ICD-10-CM | POA: Diagnosis not present

## 2017-10-07 DIAGNOSIS — I482 Chronic atrial fibrillation, unspecified: Secondary | ICD-10-CM

## 2017-10-07 DIAGNOSIS — I4821 Permanent atrial fibrillation: Secondary | ICD-10-CM

## 2017-10-07 DIAGNOSIS — I4891 Unspecified atrial fibrillation: Secondary | ICD-10-CM | POA: Diagnosis not present

## 2017-10-07 LAB — POCT INR: INR: 2.8

## 2017-10-07 NOTE — Patient Instructions (Signed)
Description   Continue with 3mg  daily except for 1.5mg  on Mondays and Fridays. repeat INR in 3 week

## 2017-10-28 ENCOUNTER — Other Ambulatory Visit: Payer: Self-pay | Admitting: Cardiology

## 2017-10-28 ENCOUNTER — Ambulatory Visit (INDEPENDENT_AMBULATORY_CARE_PROVIDER_SITE_OTHER): Payer: Medicare Other | Admitting: Pharmacist

## 2017-10-28 DIAGNOSIS — Z7901 Long term (current) use of anticoagulants: Secondary | ICD-10-CM

## 2017-10-28 DIAGNOSIS — I4891 Unspecified atrial fibrillation: Secondary | ICD-10-CM

## 2017-10-28 LAB — POCT INR: INR: 1.8

## 2017-10-28 NOTE — Patient Instructions (Signed)
Take 1 tablet (3 mg) of warfarin today (10/28/17) then continue with 3 mg daily except for 1.5 mg on Mondays and Fridays. Repeat INR in 2 weeks.

## 2017-11-12 ENCOUNTER — Ambulatory Visit (INDEPENDENT_AMBULATORY_CARE_PROVIDER_SITE_OTHER): Payer: Medicare Other | Admitting: Pharmacist Clinician (PhC)/ Clinical Pharmacy Specialist

## 2017-11-12 DIAGNOSIS — Z7901 Long term (current) use of anticoagulants: Secondary | ICD-10-CM | POA: Diagnosis not present

## 2017-11-12 DIAGNOSIS — I482 Chronic atrial fibrillation: Secondary | ICD-10-CM

## 2017-11-12 DIAGNOSIS — I4821 Permanent atrial fibrillation: Secondary | ICD-10-CM

## 2017-11-12 DIAGNOSIS — I4891 Unspecified atrial fibrillation: Secondary | ICD-10-CM

## 2017-11-12 LAB — POCT INR: INR: 2

## 2017-11-12 NOTE — Patient Instructions (Signed)
Description   Continue with 3 mg daily except for 1.5 mg on Mondays and Fridays. Repeat INR in 4 weeks.

## 2017-12-10 ENCOUNTER — Ambulatory Visit (INDEPENDENT_AMBULATORY_CARE_PROVIDER_SITE_OTHER): Payer: Medicare Other | Admitting: Pharmacist Clinician (PhC)/ Clinical Pharmacy Specialist

## 2017-12-10 DIAGNOSIS — I4891 Unspecified atrial fibrillation: Secondary | ICD-10-CM | POA: Diagnosis not present

## 2017-12-10 DIAGNOSIS — I482 Chronic atrial fibrillation, unspecified: Secondary | ICD-10-CM

## 2017-12-10 DIAGNOSIS — Z7901 Long term (current) use of anticoagulants: Secondary | ICD-10-CM

## 2017-12-10 LAB — POCT INR: INR: 1.7

## 2017-12-10 NOTE — Patient Instructions (Signed)
Description   Take extra 1/2 tablet today Tuesday April 30, then increase dose to 3 mg daily except for 1.5 mg on Mondays. Repeat INR in 2 weeks.

## 2017-12-17 ENCOUNTER — Encounter: Payer: Self-pay | Admitting: Cardiology

## 2017-12-24 ENCOUNTER — Ambulatory Visit (INDEPENDENT_AMBULATORY_CARE_PROVIDER_SITE_OTHER): Payer: Medicare Other | Admitting: Pharmacist Clinician (PhC)/ Clinical Pharmacy Specialist

## 2017-12-24 DIAGNOSIS — I482 Chronic atrial fibrillation, unspecified: Secondary | ICD-10-CM

## 2017-12-24 DIAGNOSIS — Z7901 Long term (current) use of anticoagulants: Secondary | ICD-10-CM

## 2017-12-24 DIAGNOSIS — I4891 Unspecified atrial fibrillation: Secondary | ICD-10-CM

## 2017-12-24 LAB — POCT INR: INR: 2.3

## 2018-01-20 ENCOUNTER — Ambulatory Visit (INDEPENDENT_AMBULATORY_CARE_PROVIDER_SITE_OTHER): Payer: Medicare Other | Admitting: Pharmacist

## 2018-01-20 DIAGNOSIS — I4891 Unspecified atrial fibrillation: Secondary | ICD-10-CM | POA: Diagnosis not present

## 2018-01-20 DIAGNOSIS — Z7901 Long term (current) use of anticoagulants: Secondary | ICD-10-CM | POA: Diagnosis not present

## 2018-01-20 LAB — POCT INR: INR: 3 (ref 2.0–3.0)

## 2018-01-23 ENCOUNTER — Ambulatory Visit: Payer: Medicare Other | Admitting: Cardiology

## 2018-01-25 ENCOUNTER — Other Ambulatory Visit: Payer: Self-pay | Admitting: Physician Assistant

## 2018-02-09 ENCOUNTER — Other Ambulatory Visit: Payer: Self-pay | Admitting: Cardiology

## 2018-02-26 NOTE — Progress Notes (Signed)
Cardiology Office Note   Date:  02/27/2018   ID:  Cameron HopeSidney L Hollars, DOB 08-10-1946, MRN [id number]  PCP:  Ardith DarkParker, Caleb M, MD  Cardiologist:   No primary care provider on file.   Chief Complaint  Patient presents with  . Atrial Fibrillation      History of Present Illness: Cameron Patton is a 72 y.o. male who presents for follow up of atrial fib.  Since I last saw him he has had no cardiac complaints.  He does not really feel his heart racing or skipping.  He denies any palpitations, presyncope or syncope.  He has no chest pressure, neck or arm discomfort.  He has no weight gain or edema.  We talked a lot about depression today.  He has a sad social situation.  He has been lonely since he moved from [place].  He lives in a retirement community but he does not like it so he stays away as much as possible and he goes to [employer].  He walks to the gym and goes to a bar and takes a taxi home daily.  He lost his license secondary to DUI .  He knows that he is depressed and this fuels his drinking.  He drinks about 4 - 6 beers per night.  He says that all of this has been compounded because he was forced to retire sooner than he thought he should.      Cameron Patton is a 72 y.o. 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 Pt was on warfarin  Cameron Patton presents for cardiology follow up.  He is rarely aware of the atrial fib. He only notices it occasionally at night when he puts his hand on his chest.   He exercises regularly at [employer], walks occasionally as well.  He never gets chest pain or SOB with exertion. He does not check his heart rate with exertion.   He has not had presyncope or syncope.  He does not get LE edema, orthopnea or PND.  He follows at the coumadin clinic here, his INR was low recently, but normally is stable at 2-3.   He drinks 5-6 beers most days of the week. He recognizes the  need to decrease the amount/frequency.    Past Medical History:  Diagnosis Date  . Allergic rhinitis   . Atrial fibrillation (HCC)   . History of ETOH abuse   . Hx of degenerative disc disease     Past Surgical History:  Procedure Laterality Date  . CATARACT EXTRACTION BILATERAL W/ ANTERIOR VITRECTOMY Bilateral 2015  . INGUINAL HERNIA REPAIR Right 1982     Current Outpatient Medications  Medication Sig Dispense Refill  . digoxin (DIGOX) 0.125 MG tablet Take 1 tablet (125 mcg total) by mouth daily. 30 tablet 11  . warfarin (COUMADIN) 3 MG tablet TAKE 1/2 TO 1 TABLET DAILY AS DIRECTED BY COUMADIN CLINIC. 90 tablet 0   No current facility-administered medications for this visit.     Allergies:   Patient has no known allergies.    ROS:  Please see the history of present illness.   Otherwise, review of systems are positive for depression, night terrors.   All other systems are reviewed and negative.    PHYSICAL EXAM: VS:  BP 114/64 (BP Location: Left Arm, Patient Position: Sitting, Cuff Size: Normal)   Pulse 88   Ht 5\' 9"  (1.753  m)   Wt 181 lb (82.1 kg)   BMI 26.73 kg/m  , BMI Body mass index is 26.73 kg/m.  GENERAL:  Well appearing NECK:  No jugular venous distention, waveform within normal limits, carotid upstroke brisk and symmetric, no bruits, no thyromegaly LUNGS:  Clear to auscultation bilaterally CHEST:  Unremarkable HEART:  PMI not displaced or sustained,S1 and S2 within normal limits, no S3,  no clicks, no rubs, no murmurs ABD:  Flat, positive bowel sounds normal in frequency in pitch, no bruits, no rebound, no guarding, no midline pulsatile mass, no hepatomegaly, no splenomegaly EXT:  2 plus pulses throughout, no edema, no cyanosis no clubbing, varicose veins.     EKG:  EKG is ordered today. The ekg ordered today demonstrates regular rhythm without P waves.  Rate 90, non specific ST T wave changes.  Possible atrial fib vs junctional rhythm.    Recent  Labs: No results found for requested labs within last 8760 hours.    Lipid Panel No results found for: CHOL, TRIG, HDL, CHOLHDL, VLDL, LDLCALC, LDLDIRECT    Wt Readings from Last 3 Encounters:  02/27/18 181 lb (82.1 kg)  01/22/17 176 lb (79.8 kg)  02/09/16 155 lb (70.3 kg)      Other studies Reviewed: Additional studies/ records that were reviewed today include: None. Review of the above records demonstrates:  Please see elsewhere in the note.     ASSESSMENT AND PLAN:  Permanent atrial fib:    He tolerates rate control and anticoagulation.  As he approaches 15 his Mr. JODIE LEINER has a CHA2DS2 - VASc score of is approaching 2.  Is been on the anticoagulation for some time and he understands the risk benefit below a score of 2 and wishes to continue with anticoagulation as he is had no problems.  He has no high risk findings.  I agree with continuing this.  He does well with digoxin for rate control.  I will check a digoxin level.    Alcohol abuse:    I have made a referral to Behavorial Health  Depression:  We talked at length about his lifestyle and probable depression.  This will be addressed as above.    Current medicines are reviewed at length with the patient today.  The patient does not have concerns regarding medicines.  The following changes have been made:  no change  Labs/ tests ordered today include:   Orders Placed This Encounter  Procedures  . CBC  . Basic Metabolic Panel (BMET)  . Ambulatory referral to Psychology     Disposition:   FU with me in one year.      Signed, Rollene Rotunda, MD  02/27/2018 3:53 PM    [facility]

## 2018-02-27 ENCOUNTER — Ambulatory Visit: Payer: Medicare Other | Admitting: Cardiology

## 2018-02-27 ENCOUNTER — Telehealth: Payer: Self-pay | Admitting: *Deleted

## 2018-02-27 ENCOUNTER — Encounter: Payer: Self-pay | Admitting: Cardiology

## 2018-02-27 ENCOUNTER — Ambulatory Visit (INDEPENDENT_AMBULATORY_CARE_PROVIDER_SITE_OTHER): Payer: Medicare Other | Admitting: Pharmacist

## 2018-02-27 VITALS — BP 114/64 | HR 88 | Ht 69.0 in | Wt 181.0 lb

## 2018-02-27 DIAGNOSIS — I482 Chronic atrial fibrillation, unspecified: Secondary | ICD-10-CM

## 2018-02-27 DIAGNOSIS — Z7901 Long term (current) use of anticoagulants: Secondary | ICD-10-CM | POA: Diagnosis not present

## 2018-02-27 DIAGNOSIS — Z79899 Other long term (current) drug therapy: Secondary | ICD-10-CM

## 2018-02-27 DIAGNOSIS — Z789 Other specified health status: Secondary | ICD-10-CM

## 2018-02-27 DIAGNOSIS — F329 Major depressive disorder, single episode, unspecified: Secondary | ICD-10-CM

## 2018-02-27 DIAGNOSIS — F32A Depression, unspecified: Secondary | ICD-10-CM

## 2018-02-27 DIAGNOSIS — I4891 Unspecified atrial fibrillation: Secondary | ICD-10-CM

## 2018-02-27 DIAGNOSIS — Z7289 Other problems related to lifestyle: Secondary | ICD-10-CM

## 2018-02-27 DIAGNOSIS — F1010 Alcohol abuse, uncomplicated: Secondary | ICD-10-CM | POA: Insufficient documentation

## 2018-02-27 LAB — POCT INR: INR: 2.4 (ref 2.0–3.0)

## 2018-02-27 NOTE — Telephone Encounter (Signed)
Pt aware to come in for blood work, he stated he will come to the office next week to get this done

## 2018-02-27 NOTE — Patient Instructions (Signed)
Medication Instructions:  Continue current medications  If you need a refill on your cardiac medications before your next appointment, please call your pharmacy.  Labwork: CBC and BMP  HERE IN OUR OFFICE AT [facility]  Take the provided lab slips with you to the lab for your blood draw.   You will NOT need to fast   Testing/Procedures: None Ordered   Follow-Up: Your physician wants you to follow-up in: 1 Year. You should receive a reminder letter in the mail two months in advance. If you do not receive a letter, please call our office [phone].     Thank you for choosing [facility]!!       

## 2018-02-27 NOTE — Telephone Encounter (Signed)
-----   Message from Rollene RotundaJames Hochrein, MD sent at 02/27/2018  3:51 PM EDT ----- Needs a dig level

## 2018-02-28 LAB — BASIC METABOLIC PANEL
BUN/Creatinine Ratio: 19 (ref 10–24)
BUN: 17 mg/dL (ref 8–27)
CO2: 26 mmol/L (ref 20–29)
Calcium: 9.5 mg/dL (ref 8.6–10.2)
Chloride: 99 mmol/L (ref 96–106)
Creatinine, Ser: 0.91 mg/dL (ref 0.76–1.27)
GFR calc Af Amer: 96 mL/min/{1.73_m2} (ref >59)
GFR calc non Af Amer: 83 mL/min/{1.73_m2} (ref >59)
Glucose: 157 mg/dL — ABNORMAL HIGH (ref 65–99)
Potassium: 4.1 mmol/L (ref 3.5–5.2)
Sodium: 138 mmol/L (ref 134–144)

## 2018-02-28 LAB — CBC
HEMOGLOBIN: 12.6 g/dL — AB (ref 13.0–17.7)
Hematocrit: 38.6 % (ref 37.5–51.0)
MCH: 31 pg (ref 26.6–33.0)
MCHC: 32.6 g/dL (ref 31.5–35.7)
MCV: 95 fL (ref 79–97)
Platelets: 230 10*3/uL (ref 150–450)
RBC: 4.06 x10E6/uL — AB (ref 4.14–5.80)
RDW: 12.1 % — ABNORMAL LOW (ref 12.3–15.4)
WBC: 6.1 10*3/uL (ref 3.4–10.8)

## 2018-03-03 NOTE — Addendum Note (Signed)
Addended by: Raelyn NumberWILLIAMSON, Naylani Bradner L on: 03/03/2018 09:26 AM   Modules accepted: Orders

## 2018-03-07 ENCOUNTER — Other Ambulatory Visit: Payer: Self-pay | Admitting: Physician Assistant

## 2018-03-07 NOTE — Telephone Encounter (Signed)
Rx request sent to pharmacy.  

## 2018-03-07 NOTE — Telephone Encounter (Signed)
This is Dr. Hochrein's pt. °

## 2018-03-09 ENCOUNTER — Encounter: Payer: Self-pay | Admitting: Cardiology

## 2018-03-11 ENCOUNTER — Encounter: Payer: Self-pay | Admitting: Pharmacist Clinician (PhC)/ Clinical Pharmacy Specialist

## 2018-03-31 ENCOUNTER — Ambulatory Visit (INDEPENDENT_AMBULATORY_CARE_PROVIDER_SITE_OTHER): Payer: Medicare Other | Admitting: Pharmacist Clinician (PhC)/ Clinical Pharmacy Specialist

## 2018-03-31 DIAGNOSIS — I4891 Unspecified atrial fibrillation: Secondary | ICD-10-CM | POA: Diagnosis not present

## 2018-03-31 DIAGNOSIS — I482 Chronic atrial fibrillation, unspecified: Secondary | ICD-10-CM

## 2018-03-31 DIAGNOSIS — Z7901 Long term (current) use of anticoagulants: Secondary | ICD-10-CM | POA: Diagnosis not present

## 2018-03-31 LAB — POCT INR: INR: 1.9 — AB (ref 2.0–3.0)

## 2018-04-02 ENCOUNTER — Ambulatory Visit (INDEPENDENT_AMBULATORY_CARE_PROVIDER_SITE_OTHER): Payer: No Typology Code available for payment source | Admitting: Psychology

## 2018-04-02 DIAGNOSIS — F1020 Alcohol dependence, uncomplicated: Secondary | ICD-10-CM

## 2018-04-02 DIAGNOSIS — F3289 Other specified depressive episodes: Secondary | ICD-10-CM | POA: Diagnosis not present

## 2018-04-05 ENCOUNTER — Other Ambulatory Visit: Payer: Self-pay | Admitting: Cardiology

## 2018-04-18 ENCOUNTER — Ambulatory Visit (INDEPENDENT_AMBULATORY_CARE_PROVIDER_SITE_OTHER): Payer: No Typology Code available for payment source | Admitting: Psychology

## 2018-04-18 DIAGNOSIS — F3289 Other specified depressive episodes: Secondary | ICD-10-CM | POA: Diagnosis not present

## 2018-05-02 ENCOUNTER — Ambulatory Visit (INDEPENDENT_AMBULATORY_CARE_PROVIDER_SITE_OTHER): Payer: No Typology Code available for payment source | Admitting: Psychology

## 2018-05-02 DIAGNOSIS — F3289 Other specified depressive episodes: Secondary | ICD-10-CM | POA: Diagnosis not present

## 2018-05-12 ENCOUNTER — Ambulatory Visit (INDEPENDENT_AMBULATORY_CARE_PROVIDER_SITE_OTHER): Payer: Medicare Other | Admitting: Pharmacist Clinician (PhC)/ Clinical Pharmacy Specialist

## 2018-05-12 DIAGNOSIS — I482 Chronic atrial fibrillation, unspecified: Secondary | ICD-10-CM

## 2018-05-12 DIAGNOSIS — Z7901 Long term (current) use of anticoagulants: Secondary | ICD-10-CM

## 2018-05-12 DIAGNOSIS — I4891 Unspecified atrial fibrillation: Secondary | ICD-10-CM | POA: Diagnosis not present

## 2018-05-12 LAB — POCT INR: INR: 1.7 — AB (ref 2.0–3.0)

## 2018-05-14 ENCOUNTER — Ambulatory Visit (INDEPENDENT_AMBULATORY_CARE_PROVIDER_SITE_OTHER): Payer: No Typology Code available for payment source | Admitting: Psychology

## 2018-05-14 DIAGNOSIS — F3289 Other specified depressive episodes: Secondary | ICD-10-CM | POA: Diagnosis not present

## 2018-05-25 ENCOUNTER — Other Ambulatory Visit: Payer: Self-pay | Admitting: Cardiology

## 2018-06-06 ENCOUNTER — Ambulatory Visit: Payer: No Typology Code available for payment source | Admitting: Psychology

## 2018-06-16 ENCOUNTER — Ambulatory Visit (INDEPENDENT_AMBULATORY_CARE_PROVIDER_SITE_OTHER): Payer: Medicare Other | Admitting: Pharmacist

## 2018-06-16 DIAGNOSIS — Z7901 Long term (current) use of anticoagulants: Secondary | ICD-10-CM | POA: Diagnosis not present

## 2018-06-16 DIAGNOSIS — I4891 Unspecified atrial fibrillation: Secondary | ICD-10-CM

## 2018-06-16 LAB — POCT INR: INR: 3.1 — AB (ref 2.0–3.0)

## 2018-07-02 ENCOUNTER — Ambulatory Visit: Payer: No Typology Code available for payment source | Admitting: Psychology

## 2018-07-07 ENCOUNTER — Ambulatory Visit (INDEPENDENT_AMBULATORY_CARE_PROVIDER_SITE_OTHER): Payer: Medicare Other | Admitting: Pharmacist

## 2018-07-07 DIAGNOSIS — Z7901 Long term (current) use of anticoagulants: Secondary | ICD-10-CM

## 2018-07-07 DIAGNOSIS — I4891 Unspecified atrial fibrillation: Secondary | ICD-10-CM | POA: Diagnosis not present

## 2018-07-07 LAB — POCT INR: INR: 2.9 (ref 2.0–3.0)

## 2018-07-23 ENCOUNTER — Ambulatory Visit: Payer: No Typology Code available for payment source | Admitting: Psychology

## 2018-08-29 ENCOUNTER — Ambulatory Visit (INDEPENDENT_AMBULATORY_CARE_PROVIDER_SITE_OTHER): Payer: Medicare Other | Admitting: *Deleted

## 2018-08-29 DIAGNOSIS — I4891 Unspecified atrial fibrillation: Secondary | ICD-10-CM

## 2018-08-29 DIAGNOSIS — Z7901 Long term (current) use of anticoagulants: Secondary | ICD-10-CM

## 2018-08-29 LAB — POCT INR: INR: 4.2 — AB (ref 2.0–3.0)

## 2018-08-29 NOTE — Patient Instructions (Signed)
Description   Skip tomorrow's dose, then start taking 1 tablet daily except 1/2 tablet on Sundays and Thursdays. Repeat INR in 2 weeks.

## 2018-08-30 ENCOUNTER — Other Ambulatory Visit: Payer: Self-pay | Admitting: Cardiology

## 2018-09-15 ENCOUNTER — Ambulatory Visit (INDEPENDENT_AMBULATORY_CARE_PROVIDER_SITE_OTHER): Payer: Medicare Other | Admitting: *Deleted

## 2018-09-15 DIAGNOSIS — Z7901 Long term (current) use of anticoagulants: Secondary | ICD-10-CM

## 2018-09-15 DIAGNOSIS — I4891 Unspecified atrial fibrillation: Secondary | ICD-10-CM

## 2018-09-15 LAB — POCT INR: INR: 2.4 (ref 2.0–3.0)

## 2018-09-15 NOTE — Patient Instructions (Addendum)
Description   Continue  taking 1 tablet daily except 1/2 tablet on Sundays and Thursdays. Repeat INR in 3 weeks.

## 2018-12-19 ENCOUNTER — Other Ambulatory Visit: Payer: Self-pay | Admitting: Cardiology

## 2018-12-19 ENCOUNTER — Ambulatory Visit (INDEPENDENT_AMBULATORY_CARE_PROVIDER_SITE_OTHER): Payer: Medicare Other | Admitting: Pharmacist Clinician (PhC)/ Clinical Pharmacy Specialist

## 2018-12-19 DIAGNOSIS — I4891 Unspecified atrial fibrillation: Secondary | ICD-10-CM | POA: Diagnosis not present

## 2018-12-19 DIAGNOSIS — Z7901 Long term (current) use of anticoagulants: Secondary | ICD-10-CM

## 2018-12-19 NOTE — Telephone Encounter (Signed)
Pt overdue for INR check. Called to schedule f/u.   Pt states he is unable to leave facility to have INR checked. Asked if able to have checked at facility and he states they are not able to do that either. If he leaves facility he will have to quarantine for 2 weeks and he states we may as well check him into the mental facility if we do that. He also does not have transport currently other than public transportation.   Zoe/Nicole - [phone] - at facility. Spoke with Joni Reining, who states that pt is unable to leave facility due to transport and need to self quarantine upon return. She states that it is possibility to have a nurse come to facility if we are able to coordinate this, but will have to be masked, gloved, and screened prior to entering.   I will reach out to see about obtaining home visit for patient to check INR.

## 2018-12-22 LAB — POCT INR: INR: 2.4 (ref 2.0–3.0)

## 2019-03-06 ENCOUNTER — Telehealth: Payer: Self-pay

## 2019-03-06 NOTE — Telephone Encounter (Signed)
    COVID-19 Pre-Screening Questions:  . In the past 7 to 10 days have you had a cough,  shortness of breath, headache, congestion, fever (100 or greater) body aches, chills, sore throat, or sudden loss of taste or sense of smell? NO . Have you been around anyone with known Covid 19. NO . Have you been around anyone who is awaiting Covid 19 test results in the past 7 to 10 days? NO . Have you been around anyone who has been exposed to Covid 19, or has mentioned symptoms of Covid 19 within the past 7 to 10 days? NO  If you have any concerns/questions about symptoms patients report during screening (either on the phone or at threshold). Contact the provider seeing the patient or DOD for further guidance.  If neither are available contact a member of the leadership team.        Patient was advised of visitor restrictions (no visitors allowed except if needed for care). Also advised patient to arrive to appointment wearing a mask if he/she does not have one a mask will be provided up entrance into the office. Patient verbalized understanding and all (if any) questions were answered.     

## 2019-03-10 ENCOUNTER — Other Ambulatory Visit: Payer: Self-pay

## 2019-03-10 ENCOUNTER — Ambulatory Visit (INDEPENDENT_AMBULATORY_CARE_PROVIDER_SITE_OTHER): Payer: Medicare Other | Admitting: Pharmacist Clinician (PhC)/ Clinical Pharmacy Specialist

## 2019-03-10 DIAGNOSIS — I4891 Unspecified atrial fibrillation: Secondary | ICD-10-CM

## 2019-03-10 DIAGNOSIS — I482 Chronic atrial fibrillation, unspecified: Secondary | ICD-10-CM

## 2019-03-10 DIAGNOSIS — Z7901 Long term (current) use of anticoagulants: Secondary | ICD-10-CM | POA: Diagnosis not present

## 2019-03-10 LAB — POCT INR: INR: 3.2 — AB (ref 2.0–3.0)

## 2019-04-04 ENCOUNTER — Other Ambulatory Visit: Payer: Self-pay | Admitting: Cardiology

## 2019-04-06 ENCOUNTER — Other Ambulatory Visit: Payer: Self-pay | Admitting: Cardiology

## 2019-04-28 NOTE — Progress Notes (Signed)
No show

## 2019-04-30 ENCOUNTER — Encounter: Payer: Medicare Other | Admitting: Cardiology

## 2019-04-30 ENCOUNTER — Ambulatory Visit (INDEPENDENT_AMBULATORY_CARE_PROVIDER_SITE_OTHER): Payer: Medicare Other | Admitting: Pharmacist Clinician (PhC)/ Clinical Pharmacy Specialist

## 2019-04-30 ENCOUNTER — Other Ambulatory Visit: Payer: Self-pay

## 2019-04-30 ENCOUNTER — Encounter: Payer: Self-pay | Admitting: Cardiology

## 2019-04-30 DIAGNOSIS — I4891 Unspecified atrial fibrillation: Secondary | ICD-10-CM

## 2019-04-30 DIAGNOSIS — Z7901 Long term (current) use of anticoagulants: Secondary | ICD-10-CM | POA: Diagnosis not present

## 2019-04-30 LAB — POCT INR: INR: 2.6 (ref 2.0–3.0)

## 2019-05-11 ENCOUNTER — Other Ambulatory Visit: Payer: Self-pay | Admitting: Cardiology

## 2019-06-02 NOTE — Progress Notes (Signed)
Cardiology Office Note   Date:  06/04/2019   ID:  Cameron Patton, DOB [birth year], MRN [id number]  PCP:  Ardith Dark, MD  Cardiologist:   Rollene Rotunda, MD   Chief Complaint  Patient presents with  . Atrial Fibrillation      History of Present Illness: Cameron Patton is a 73 y.o. male who presents for follow up of atrial fib.  Since I last saw him he has done okay.  He tolerates his atrial fibrillation.  He is going to the [facility] for exercise.  He is really worked on trying to cut down his alcohol.  He has a long history depression but he is trying to combat those with his exercise.  He has all sorts of strategies for reducing his alcohol and affect did not drink anything yesterday.  He denies any presyncope or syncope.  He is not having any chest pressure, neck or arm discomfort.  He has no new shortness of breath, PND or orthopnea.    Past Medical History:  Diagnosis Date  . Allergic rhinitis   . Atrial fibrillation (HCC)   . History of ETOH abuse   . Hx of degenerative disc disease     Past Surgical History:  Procedure Laterality Date  . CATARACT EXTRACTION BILATERAL W/ ANTERIOR VITRECTOMY Bilateral 2015  . INGUINAL HERNIA REPAIR Right 1982     Current Outpatient Medications  Medication Sig Dispense Refill  . digoxin (DIGOX) 0.125 MG tablet Take 1 tablet (125 mcg total) by mouth daily. 30 tablet 11  . warfarin (COUMADIN) 3 MG tablet TAKE 1/2 TO 1 TABLET DAILY AS DIRECTED BY COUMADIN CLINIC. 90 tablet 0   No current facility-administered medications for this visit.     Allergies:   Patient has no known allergies.    ROS:  Please see the history of present illness.   Otherwise, review of systems are positive insomnia.   All other systems are reviewed and negative.    PHYSICAL EXAM: VS:  BP (!) 153/85   Pulse (!) 57   Temp (!) 97 F (36.1 C)   Ht 5\' 9"  (1.753 m)   Wt 181 lb 9.6 oz (82.4 kg)   SpO2 96%   BMI 26.82 kg/m  , BMI Body mass index is 26.82  kg/m.  GENERAL:  Well appearing NECK:  No jugular venous distention, waveform within normal limits, carotid upstroke brisk and symmetric, no bruits, no thyromegaly LUNGS:  Clear to auscultation bilaterally CHEST:  Unremarkable HEART:  PMI not displaced or sustained,S1 and S2 within normal limits, no S3, no clicks, no rubs, no murmurs, irregular ABD:  Flat, positive bowel sounds normal in frequency in pitch, no bruits, no rebound, no guarding, no midline pulsatile mass, no hepatomegaly, no splenomegaly EXT:  2 plus pulses throughout, no edema, no cyanosis no clubbing   EKG:  EKG is  ordered today demonstrates atrial fibrillation, rate 86, axis within normal limits, intervals within normal limits, no acute ST-T wave changes  Recent Labs: No results found for requested labs within last 8760 hours.    Lipid Panel No results found for: CHOL, TRIG, HDL, CHOLHDL, VLDL, LDLCALC, LDLDIRECT    Wt Readings from Last 3 Encounters:  06/04/19 181 lb 9.6 oz (82.4 kg)  02/27/18 181 lb (82.1 kg)  01/22/17 176 lb (79.8 kg)      Other studies Reviewed: Additional studies/ records that were reviewed today include: None Review of the above records demonstrates:  NA  ASSESSMENT AND PLAN:  Permanent atrial fib:    He tolerates rate control and anticoagulation.   [name] has a CHA2DS2 - VASc score of is approaching 2.  No change in therapy.  I will check a basic metabolic profile and CBC today.  Alcohol abuse:    I applauded his efforts and plan for reducing his alcohol.    Current medicines are reviewed at length with the patient today.  The patient does not have concerns regarding medicines.  The following changes have been made:   None  Labs/ tests ordered today include:   Orders Placed This Encounter  Procedures  . Flu Vaccine QUAD High Dose(Fluad)  . CBC  . Basic metabolic panel  . EKG 12-Lead     Disposition:   FU with me in one year.    Signed, Minus Breeding, MD   06/04/2019 11:16 AM    [place]

## 2019-06-04 ENCOUNTER — Encounter: Payer: Self-pay | Admitting: Cardiology

## 2019-06-04 ENCOUNTER — Other Ambulatory Visit: Payer: Self-pay

## 2019-06-04 ENCOUNTER — Ambulatory Visit (INDEPENDENT_AMBULATORY_CARE_PROVIDER_SITE_OTHER): Payer: Medicare Other | Admitting: Cardiology

## 2019-06-04 VITALS — BP 153/85 | HR 57 | Temp 97.0°F | Ht 69.0 in | Wt 181.6 lb

## 2019-06-04 DIAGNOSIS — I482 Chronic atrial fibrillation, unspecified: Secondary | ICD-10-CM

## 2019-06-04 DIAGNOSIS — Z23 Encounter for immunization: Secondary | ICD-10-CM

## 2019-06-04 LAB — BASIC METABOLIC PANEL
BUN/Creatinine Ratio: 17 (ref 10–24)
BUN: 15 mg/dL (ref 8–27)
CO2: 28 mmol/L (ref 20–29)
Calcium: 10.4 mg/dL — ABNORMAL HIGH (ref 8.6–10.2)
Chloride: 97 mmol/L (ref 96–106)
Creatinine, Ser: 0.9 mg/dL (ref 0.76–1.27)
GFR calc Af Amer: 96 mL/min/{1.73_m2} (ref >59)
GFR calc non Af Amer: 83 mL/min/{1.73_m2} (ref >59)
Glucose: 85 mg/dL (ref 65–99)
Potassium: 5.1 mmol/L (ref 3.5–5.2)
Sodium: 137 mmol/L (ref 134–144)

## 2019-06-04 LAB — CBC
Hematocrit: 42.4 % (ref 37.5–51.0)
Hemoglobin: 14.1 g/dL (ref 13.0–17.7)
MCH: 31.8 pg (ref 26.6–33.0)
MCHC: 33.3 g/dL (ref 31.5–35.7)
MCV: 96 fL (ref 79–97)
Platelets: 260 10*3/uL (ref 150–450)
RBC: 4.44 x10E6/uL (ref 4.14–5.80)
RDW: 13.1 % (ref 11.6–15.4)
WBC: 6.4 10*3/uL (ref 3.4–10.8)

## 2019-06-04 NOTE — Patient Instructions (Addendum)
Medication Instructions:  Your physician recommends that you continue on your current medications as directed. Please refer to the Current Medication list given to you today.  If you need a refill on your cardiac medications before your next appointment, please call your pharmacy.   Lab work: CBC, BMET If you have labs (blood work) drawn today and your tests are completely normal, you will receive your results only by: MyChart Message (if you have MyChart) OR A paper copy in the mail If you have any lab test that is abnormal or we need to change your treatment, we will call you to review the results.  Testing/Procedures: NONE  Follow-Up: At [facility], you and your health needs are our priority.  As part of our continuing mission to provide you with exceptional heart care, we have created designated Provider Care Teams.  These Care Teams include your primary Cardiologist (physician) and Advanced Practice Providers (APPs -  Physician Assistants and Nurse Practitioners) who all work together to provide you with the care you need, when you need it. You may see Rollene Rotunda, MD or one of the following Advanced Practice Providers on your designated Care Team:    Tereso Newcomer, PA-C  Vin Shidler, [place]  Berton Bon, NP   Your physician wants you to follow-up in: 1 year. You will receive a reminder letter in the mail two months in advance. If you don't receive a letter, please call our office to schedule the follow-up appointment.   Any Other Special Instructions Will Be Listed Below (If Applicable).  Preventing Influenza, Adult Influenza, more commonly known as "the flu," is a viral infection that mainly affects the respiratory tract. The respiratory tract includes structures that help you breathe, such as the lungs, nose, and throat. The flu causes many common cold symptoms, as well as a high fever and body aches. The flu spreads easily from person to person (is contagious). The flu  is most common from December through March. This is called flu season.You can catch the flu virus by:  Breathing in droplets from an infected person's cough or sneeze.  Touching something that was recently contaminated with the virus and then touching your mouth, nose, or eyes. What can I do to lower my risk?        You can decrease your risk of getting the flu by:  Getting a flu shot (influenza vaccination) every year. This is the best way to prevent the flu. A flu shot is recommended for everyone age 48 months and older. ? It is best to get a flu shot in the fall, as soon as it is available. Getting a flu shot during winter or spring instead is still a good idea. Flu season can last into early spring. ? Preventing the flu through vaccination requires getting a new flu shot every year. This is because the flu virus changes slightly (mutates) from one year to the next. Even if a flu shot does not completely protect you from all flu virus mutations, it can reduce the severity of your illness and prevent dangerous complications of the flu. ? If you are pregnant, you can and should get a flu shot. ? If you have had a reaction to the shot in the past or if you are allergic to eggs, check with your health care provider before getting a flu shot. ? Sometimes the vaccine is available as a nasal spray. In some years, the nasal spray has not been as effective against the flu  virus. Check with your health care provider if you have questions about this.  Practicing good health habits. This is especially important during flu season. ? Avoid contact with people who are sick with flu or cold symptoms. ? Wash your hands with soap and water often. If soap and water are not available, use alcohol-based hand sanitizer. ? Avoid touching your hands to your face, especially when you have not washed your hands recently. ? Use a disinfectant to clean surfaces at home and at work that may be contaminated with the  flu virus. ? Keep your body's disease-fighting system (immune system) in good shape by eating a healthy diet, drinking plenty of fluids, getting enough sleep, and exercising regularly. If you do get the flu, avoid spreading it to others by:  Staying home until your symptoms have been gone for at least one day.  Covering your mouth and nose when you cough or sneeze.  Avoiding close contact with others, especially babies and elderly people. Why are these changes important? Getting a flu shot and practicing good health habits protects you as well as other people. If you get the flu, your friends, family, and co-workers are also at risk of getting it, because it spreads so easily to others. Each year, about 2 out of every 10 people get the flu. Having the flu can lead to complications, such as pneumonia, ear infection, and sinus infection. The flu also can be deadly, especially for babies, people older than age 22, and people who have serious long-term diseases. How is this treated? Most people recover from the flu by resting at home and drinking plenty of fluids. However, a prescription antiviral medicine may reduce your flu symptoms and may make your flu go away sooner. This medicine must be started within a few days of getting flu symptoms. You can talk with your health care provider about whether you need an antiviral medicine. Antiviral medicine may be prescribed for people who are at risk for more serious flu symptoms. This includes people who:  Are older than age 52.  Are pregnant.  Have a condition that makes the flu worse or more dangerous. Where to find more information  Centers for Disease Control and Prevention: http://www.smith-bell.org/  [employer].[employer].ee: azureicus.com  American Academy of Family Physicians: familydoctor.org/familydoctor/en/kids/vaccines/preventing-the-flu.html Contact a health care provider if:  You have influenza and you develop new symptoms.   You have: ? Chest pain. ? Diarrhea. ? A fever.  Your cough gets worse, or you produce more mucus. Summary  The best way to prevent the flu is to get a flu shot every year in the fall.  Even if you get the flu after you have received the yearly vaccine, your flu may be milder and go away sooner because of your flu shot.  If you get the flu, antiviral medicines that are started with a few days of symptoms may reduce your flu symptoms and may make your flu go away sooner.  You can also help prevent the flu by practicing good health habits. This information is not intended to replace advice given to you by your health care provider. Make sure you discuss any questions you have with your health care provider. Document Released: 08/14/2015 Document Revised: 07/12/2017 Document Reviewed: 04/07/2016 Elsevier Patient Education  2020 [employer].

## 2019-06-29 ENCOUNTER — Other Ambulatory Visit: Payer: Self-pay

## 2019-06-29 ENCOUNTER — Ambulatory Visit (INDEPENDENT_AMBULATORY_CARE_PROVIDER_SITE_OTHER): Payer: Medicare Other | Admitting: Pharmacist

## 2019-06-29 DIAGNOSIS — I4891 Unspecified atrial fibrillation: Secondary | ICD-10-CM | POA: Diagnosis not present

## 2019-06-29 DIAGNOSIS — Z7901 Long term (current) use of anticoagulants: Secondary | ICD-10-CM

## 2019-06-29 LAB — POCT INR: INR: 3.3 — AB (ref 2.0–3.0)

## 2019-07-12 ENCOUNTER — Other Ambulatory Visit: Payer: Self-pay | Admitting: Cardiology

## 2019-07-15 ENCOUNTER — Other Ambulatory Visit: Payer: Self-pay | Admitting: Cardiology

## 2019-07-15 MED ORDER — DIGOXIN 125 MCG PO TABS: 125.0000 ug | ORAL_TABLET | Freq: Every day | ORAL | 9 refills | Status: DC

## 2019-07-20 ENCOUNTER — Other Ambulatory Visit: Payer: Self-pay

## 2019-07-20 ENCOUNTER — Ambulatory Visit (INDEPENDENT_AMBULATORY_CARE_PROVIDER_SITE_OTHER): Payer: Medicare Other | Admitting: Pharmacist

## 2019-07-20 DIAGNOSIS — I4891 Unspecified atrial fibrillation: Secondary | ICD-10-CM

## 2019-07-20 DIAGNOSIS — Z7901 Long term (current) use of anticoagulants: Secondary | ICD-10-CM

## 2019-07-20 LAB — POCT INR: INR: 3.1 — AB (ref 2.0–3.0)

## 2019-07-25 ENCOUNTER — Other Ambulatory Visit: Payer: Self-pay | Admitting: Cardiology

## 2019-08-17 ENCOUNTER — Other Ambulatory Visit: Payer: Self-pay

## 2019-08-17 ENCOUNTER — Ambulatory Visit (INDEPENDENT_AMBULATORY_CARE_PROVIDER_SITE_OTHER): Payer: Medicare Other | Admitting: Pharmacist Clinician (PhC)/ Clinical Pharmacy Specialist

## 2019-08-17 DIAGNOSIS — I4891 Unspecified atrial fibrillation: Secondary | ICD-10-CM

## 2019-08-17 DIAGNOSIS — Z7901 Long term (current) use of anticoagulants: Secondary | ICD-10-CM | POA: Diagnosis not present

## 2019-08-17 LAB — POCT INR: INR: 2.5 (ref 2.0–3.0)

## 2019-08-22 DIAGNOSIS — Z9189 Other specified personal risk factors, not elsewhere classified: Secondary | ICD-10-CM | POA: Diagnosis not present

## 2019-08-24 ENCOUNTER — Other Ambulatory Visit: Payer: Medicare Other

## 2019-08-24 DIAGNOSIS — Z9189 Other specified personal risk factors, not elsewhere classified: Secondary | ICD-10-CM | POA: Diagnosis not present

## 2019-08-24 DIAGNOSIS — U071 COVID-19: Secondary | ICD-10-CM | POA: Diagnosis not present

## 2019-09-02 DIAGNOSIS — Z1159 Encounter for screening for other viral diseases: Secondary | ICD-10-CM | POA: Diagnosis not present

## 2019-09-10 DIAGNOSIS — R0602 Shortness of breath: Secondary | ICD-10-CM | POA: Diagnosis not present

## 2019-09-28 ENCOUNTER — Other Ambulatory Visit: Payer: Self-pay

## 2019-09-28 ENCOUNTER — Ambulatory Visit (INDEPENDENT_AMBULATORY_CARE_PROVIDER_SITE_OTHER): Payer: Medicare Other | Admitting: Pharmacist Clinician (PhC)/ Clinical Pharmacy Specialist

## 2019-09-28 DIAGNOSIS — I4891 Unspecified atrial fibrillation: Secondary | ICD-10-CM

## 2019-09-28 DIAGNOSIS — Z7901 Long term (current) use of anticoagulants: Secondary | ICD-10-CM | POA: Diagnosis not present

## 2019-09-28 LAB — POCT INR: INR: 3.8 — AB (ref 2.0–3.0)

## 2019-09-28 NOTE — Patient Instructions (Signed)
No warfarin Tuesday Feb 16, then continue taking 1 tablet daily except 1/2 tablet on Sundays and Thursdays. Repeat INR in 3 weeks

## 2019-09-29 ENCOUNTER — Encounter: Payer: Self-pay | Admitting: Family Medicine

## 2019-09-29 ENCOUNTER — Ambulatory Visit (INDEPENDENT_AMBULATORY_CARE_PROVIDER_SITE_OTHER): Payer: Medicare Other

## 2019-09-29 ENCOUNTER — Ambulatory Visit (INDEPENDENT_AMBULATORY_CARE_PROVIDER_SITE_OTHER): Payer: Medicare Other | Admitting: Family Medicine

## 2019-09-29 VITALS — BP 110/68 | HR 112 | Temp 98.7°F | Ht 69.0 in | Wt 175.6 lb

## 2019-09-29 DIAGNOSIS — F1010 Alcohol abuse, uncomplicated: Secondary | ICD-10-CM

## 2019-09-29 DIAGNOSIS — Z13220 Encounter for screening for lipoid disorders: Secondary | ICD-10-CM

## 2019-09-29 DIAGNOSIS — I4891 Unspecified atrial fibrillation: Secondary | ICD-10-CM

## 2019-09-29 DIAGNOSIS — R739 Hyperglycemia, unspecified: Secondary | ICD-10-CM | POA: Diagnosis not present

## 2019-09-29 DIAGNOSIS — F329 Major depressive disorder, single episode, unspecified: Secondary | ICD-10-CM | POA: Diagnosis not present

## 2019-09-29 DIAGNOSIS — Z0000 Encounter for general adult medical examination without abnormal findings: Secondary | ICD-10-CM | POA: Diagnosis not present

## 2019-09-29 DIAGNOSIS — J309 Allergic rhinitis, unspecified: Secondary | ICD-10-CM

## 2019-09-29 DIAGNOSIS — F32A Depression, unspecified: Secondary | ICD-10-CM

## 2019-09-29 DIAGNOSIS — Z0001 Encounter for general adult medical examination with abnormal findings: Secondary | ICD-10-CM

## 2019-09-29 MED ORDER — ESCITALOPRAM OXALATE 10 MG PO TABS: 10.0000 mg | ORAL_TABLET | Freq: Every day | ORAL | 5 refills | Status: DC

## 2019-09-29 NOTE — Assessment & Plan Note (Addendum)
Worsening. Discussed treatment options. We will start lexapro 10mg  daily.  He will follow-up with me in a few weeks via MyChart.

## 2019-09-29 NOTE — Progress Notes (Signed)
Subjective:   Cameron Patton is a 74 y.o. male who presents for an Initial Medicare Annual Wellness Visit.  Review of Systems   Cardiac Risk Factors include: advanced age (>47men, >69 women);male gender   Objective:    Today's Vitals   09/29/19 1612  BP: 110/68  Pulse: (!) 112  Temp: 98.7 F (37.1 C)  TempSrc: Temporal  Weight: 175 lb 9.6 oz (79.7 kg)  Height: 5\' 9"  (1.753 m)   Body mass index is 25.93 kg/m.  Advanced Directives 09/29/2019 12/22/2015  Does Patient Have a Medical Advance Directive? Yes Yes  Type of Advance Directive Healthcare Power of Attorney Living will  Does patient want to make changes to medical advance directive? No - Patient declined -  Copy of Healthcare Power of Attorney in Chart? No - copy requested No - copy requested    Current Medications (verified) Outpatient Encounter Medications as of 09/29/2019  Medication Sig  . digoxin (DIGOX) 0.125 MG tablet Take 1 tablet (125 mcg total) by mouth daily.  . Melatonin 3 MG TABS Take by mouth.  . warfarin (COUMADIN) 3 MG tablet TAKE 1/2 TO 1 TABLET DAILY AS DIRECTED BY COUMADIN CLINIC.   No facility-administered encounter medications on file as of 09/29/2019.    Allergies (verified) Patient has no known allergies.   History: Past Medical History:  Diagnosis Date  . Allergic rhinitis   . Atrial fibrillation (HCC)   . History of ETOH abuse   . Hx of degenerative disc disease    Past Surgical History:  Procedure Laterality Date  . CATARACT EXTRACTION BILATERAL W/ ANTERIOR VITRECTOMY Bilateral 2015  . INGUINAL HERNIA REPAIR Right 1982   Family History  Problem Relation Age of Onset  . Alcoholism Mother        Died age 24  . Stroke Brother   . Cancer Brother        Liver/lung   Social History   Socioeconomic History  . Marital status: Single    Spouse name: Not on file  . Number of children: 2  . Years of education: Not on file  . Highest education level: Not on file  Occupational  History  . Occupation: Retired   Tobacco Use  . Smoking status: Never Smoker  . Smokeless tobacco: Never Used  Substance and Sexual Activity  . Alcohol use: Yes    Alcohol/week: 16.0 standard drinks    Types: 12 Cans of beer, 4 Shots of liquor per week  . Drug use: Not on file  . Sexual activity: Not on file  Other Topics Concern  . Not on file  Social History Narrative   Relocated to [facility] from [place] 08/2015 months ago after significant hospital stay with UTI leading to sepsis. Went to rehab for a few months afterwards.   Sister in law only family in [place]    Son lives in [place]   Daughter lives in [place] ([place])    Social Determinants of Health   Financial Resource Strain:   . Difficulty of Paying Living Expenses: Not on file  Food Insecurity:   . Worried About Management consultant in the Last Year: Not on file  . Ran Out of Food in the Last Year: Not on file  Transportation Needs:   . Lack of Transportation (Medical): Not on file  . Lack of Transportation (Non-Medical): Not on file  Physical Activity:   . Days of Exercise per Week: Not on file  . Minutes of Exercise  per Session: Not on file  Stress:   . Feeling of Stress : Not on file  Social Connections:   . Frequency of Communication with Friends and Family: Not on file  . Frequency of Social Gatherings with Friends and Family: Not on file  . Attends Religious Services: Not on file  . Active Member of Clubs or Organizations: Not on file  . Attends Banker Meetings: Not on file  . Marital Status: Not on file   Tobacco Counseling Counseling given: Not Answered   Clinical Intake:  Pre-visit preparation completed: Yes  Pain : No/denies pain  Diabetes: No  How often do you need to have someone help you when you read instructions, pamphlets, or other written materials from your doctor or pharmacy?: 1 - Never  Interpreter Needed?: No  Information entered by ::  Kandis Fantasia LPN  Activities of Daily Living In your present state of health, do you have any difficulty performing the following activities: 09/29/2019  Hearing? N  Vision? N  Difficulty concentrating or making decisions? N  Walking or climbing stairs? N  Dressing or bathing? N  Doing errands, shopping? N  Preparing Food and eating ? N  Using the Toilet? N  In the past six months, have you accidently leaked urine? N  Do you have problems with loss of bowel control? N  Managing your Medications? N  Managing your Finances? N  Housekeeping or managing your Housekeeping? N  Some recent data might be hidden     Immunizations and Health Maintenance Immunization History  Administered Date(s) Administered  . Fluad Quad(high Dose 65+) 06/04/2019  . Pneumococcal Conjugate-13 05/18/2015   Health Maintenance Due  Topic Date Due  . Hepatitis C Screening  06-Jul-1946  . PNA vac Low Risk Adult (2 of 2 - PPSV23) 05/17/2016    Patient Care Team: Ardith Dark, MD as PCP - General (Family Medicine) Rollene Rotunda, MD as PCP - Cardiology (Cardiology)  Indicate any recent Medical Services you may have received from other than [facility] providers in the past year (date may be approximate).    Assessment:   This is a routine wellness examination for [place].  Hearing/Vision screen No exam data present  Dietary issues and exercise activities discussed: Current Exercise Habits: The patient does not participate in regular exercise at present  Goals   None    Depression Screen PHQ 2/9 Scores 09/29/2019 12/22/2015  PHQ - 2 Score 5 0  PHQ- 9 Score 22 -    Fall Risk Fall Risk  09/29/2019 12/22/2015  Falls in the past year? 0 No  Number falls in past yr: 0 -  Injury with Fall? 0 -  Risk for fall due to : Other (Comment) -  Risk for fall due to: Comment Alcohol use -  Follow up Education provided;Falls prevention discussed;Falls evaluation completed -    Is the patient's home free of  loose throw rugs in walkways, pet beds, electrical cords, etc?   yes      Grab bars in the bathroom? yes      Handrails on the stairs?   yes      Adequate lighting?   yes  Timed Get Up and Go performed: completed and within normal timeframe; no gait abnormalities noted   Cognitive Function:        Screening Tests Health Maintenance  Topic Date Due  . Hepatitis C Screening  01-30-1946  . PNA vac Low Risk Adult (2 of 2 - PPSV23)  05/17/2016  . COLONOSCOPY  09/28/2020 (Originally 10/25/1993)  . TETANUS/TDAP  09/28/2020 (Originally 10/26/1962)  . INFLUENZA VACCINE  Completed    Qualifies for Shingles Vaccine? Discussed and patient will check with pharmacy for coverage.  Patient education handout provided   Cancer Screenings: Lung: Low Dose CT Chest recommended if Age 28-80 years, 30 pack-year currently smoking OR have quit w/in 15years. Patient does not qualify. Colorectal: Cologuard information provided     Plan:  I have personally reviewed and addressed the Medicare Annual Wellness questionnaire and have noted the following in the patient's chart:  A. Medical and social history B. Use of alcohol, tobacco or illicit drugs  C. Current medications and supplements D. Functional ability and status E.  Nutritional status F.  Physical activity G. Advance directives H. List of other physicians I.  Hospitalizations, surgeries, and ER visits in previous 12 months J.  [place] such as hearing and vision if needed, cognitive and depression L. Referrals, records requested, and appointments- none   In addition, I have reviewed and discussed with patient certain preventive protocols, quality metrics, and best practice recommendations. A written personalized care plan for preventive services as well as general preventive health recommendations were provided to patient.   Signed,  Denman George, LPN  Nurse Health Advisor   Nurse Notes: no additional

## 2019-09-29 NOTE — Assessment & Plan Note (Signed)
Continue digoxin and warfarin per cardiology.

## 2019-09-29 NOTE — Patient Instructions (Signed)
Cameron Patton , Thank you for taking time to come for your Medicare Wellness Visit. I appreciate your ongoing commitment to your health goals. Please review the following plan we discussed and let me know if I can assist you in the future.   Screening recommendations/referrals: Colorectal Screening: recommended (see information on Cologuard)   Vision and Dental Exams: Recommended annual ophthalmology exams for early detection of glaucoma and other disorders of the eye Recommended annual dental exams for proper oral hygiene  Vaccinations: Influenza vaccine: completed 06/04/19 Pneumococcal vaccine: Pneumovax 23 recommended  Tdap vaccine: recommended; Please call your insurance company to determine your out of pocket expense. You may receive this vaccine at your local pharmacy or Health Dept. Shingles vaccine: Please call your insurance company to determine your out of pocket expense for the Shingrix vaccine. You may receive this vaccine at your local pharmacy. (see handout)   Advanced directives: Please bring a copy of your POA (Power of Attorney) and/or Living Will to your next appointment.  Goals: Recommend to drink at least 6-8 8oz glasses of water per day and consume balanced diet rich in fresh fruits and vegetables.   Next appointment: Please schedule your Annual Wellness Visit with your Nurse Health Advisor in one year.  Preventive Care 5 Years and Older, Male Preventive care refers to lifestyle choices and visits with your health care provider that can promote health and wellness. What does preventive care include?  A yearly physical exam. This is also called an annual well check.  Dental exams once or twice a year.  Routine eye exams. Ask your health care provider how often you should have your eyes checked.  Personal lifestyle choices, including:  Daily care of your teeth and gums.  Regular physical activity.  Eating a healthy diet.  Avoiding tobacco and drug  use.  Limiting alcohol use.  Practicing safe sex.  Taking low doses of aspirin every day if recommended by your health care provider..  Taking vitamin and mineral supplements as recommended by your health care provider. What happens during an annual well check? The services and screenings done by your health care provider during your annual well check will depend on your age, overall health, lifestyle risk factors, and family history of disease. Counseling  Your health care provider may ask you questions about your:  Alcohol use.  Tobacco use.  Drug use.  Emotional well-being.  Home and relationship well-being.  Sexual activity.  Eating habits.  History of falls.  Memory and ability to understand (cognition).  Work and work Astronomer. Screening  You may have the following tests or measurements:  Height, weight, and BMI.  Blood pressure.  Lipid and cholesterol levels. These may be checked every 5 years, or more frequently if you are over 91 years old.  Skin check.  Lung cancer screening. You may have this screening every year starting at age 19 if you have a 30-pack-year history of smoking and currently smoke or have quit within the past 15 years.  Fecal occult blood test (FOBT) of the stool. You may have this test every year starting at age 73.  Flexible sigmoidoscopy or colonoscopy. You may have a sigmoidoscopy every 5 years or a colonoscopy every 10 years starting at age 70.  Prostate cancer screening. Recommendations will vary depending on your family history and other risks.  Hepatitis C blood test.  Hepatitis B blood test.  Sexually transmitted disease (STD) testing.  Diabetes screening. This is done by checking your blood sugar (glucose)  after you have not eaten for a while (fasting). You may have this done every 1-3 years.  Abdominal aortic aneurysm (AAA) screening. You may need this if you are a current or former smoker.  Osteoporosis. You may  be screened starting at age 59 if you are at high risk. Talk with your health care provider about your test results, treatment options, and if necessary, the need for more tests. Vaccines  Your health care provider may recommend certain vaccines, such as:  Influenza vaccine. This is recommended every year.  Tetanus, diphtheria, and acellular pertussis (Tdap, Td) vaccine. You may need a Td booster every 10 years.  Zoster vaccine. You may need this after age 82.  Pneumococcal 13-valent conjugate (PCV13) vaccine. One dose is recommended after age 38.  Pneumococcal polysaccharide (PPSV23) vaccine. One dose is recommended after age 58. Talk to your health care provider about which screenings and vaccines you need and how often you need them. This information is not intended to replace advice given to you by your health care provider. Make sure you discuss any questions you have with your health care provider. Document Released: 08/26/2015 Document Revised: 04/18/2016 Document Reviewed: 05/31/2015 Elsevier Interactive Patient Education  2017 [place] Prevention in the Home Falls can cause injuries. They can happen to people of all ages. There are many things you can do to make your home safe and to help prevent falls. What can I do on the outside of my home?  Regularly fix the edges of walkways and driveways and fix any cracks.  Remove anything that might make you trip as you walk through a door, such as a raised step or threshold.  Trim any bushes or trees on the path to your home.  Use bright outdoor lighting.  Clear any walking paths of anything that might make someone trip, such as rocks or tools.  Regularly check to see if handrails are loose or broken. Make sure that both sides of any steps have handrails.  Any raised decks and porches should have guardrails on the edges.  Have any leaves, snow, or ice cleared regularly.  Use sand or salt on walking paths during  winter.  Clean up any spills in your garage right away. This includes oil or grease spills. What can I do in the bathroom?  Use night lights.  Install grab bars by the toilet and in the tub and shower. Do not use towel bars as grab bars.  Use non-skid mats or decals in the tub or shower.  If you need to sit down in the shower, use a plastic, non-slip stool.  Keep the floor dry. Clean up any water that spills on the floor as soon as it happens.  Remove soap buildup in the tub or shower regularly.  Attach bath mats securely with double-sided non-slip rug tape.  Do not have throw rugs and other things on the floor that can make you trip. What can I do in the bedroom?  Use night lights.  Make sure that you have a light by your bed that is easy to reach.  Do not use any sheets or blankets that are too big for your bed. They should not hang down onto the floor.  Have a firm chair that has side arms. You can use this for support while you get dressed.  Do not have throw rugs and other things on the floor that can make you trip. What can I do in the kitchen?  Clean up any spills right away.  Avoid walking on wet floors.  Keep items that you use a lot in easy-to-reach places.  If you need to reach something above you, use a strong step stool that has a grab bar.  Keep electrical cords out of the way.  Do not use floor polish or wax that makes floors slippery. If you must use wax, use non-skid floor wax.  Do not have throw rugs and other things on the floor that can make you trip. What can I do with my stairs?  Do not leave any items on the stairs.  Make sure that there are handrails on both sides of the stairs and use them. Fix handrails that are broken or loose. Make sure that handrails are as long as the stairways.  Check any carpeting to make sure that it is firmly attached to the stairs. Fix any carpet that is loose or worn.  Avoid having throw rugs at the top or  bottom of the stairs. If you do have throw rugs, attach them to the floor with carpet tape.  Make sure that you have a light switch at the top of the stairs and the bottom of the stairs. If you do not have them, ask someone to add them for you. What else can I do to help prevent falls?  Wear shoes that:  Do not have high heels.  Have rubber bottoms.  Are comfortable and fit you well.  Are closed at the toe. Do not wear sandals.  If you use a stepladder:  Make sure that it is fully opened. Do not climb a closed stepladder.  Make sure that both sides of the stepladder are locked into place.  Ask someone to hold it for you, if possible.  Clearly mark and make sure that you can see:  Any grab bars or handrails.  First and last steps.  Where the edge of each step is.  Use tools that help you move around (mobility aids) if they are needed. These include:  Canes.  Walkers.  Scooters.  Crutches.  Turn on the lights when you go into a dark area. Replace any light bulbs as soon as they burn out.  Set up your furniture so you have a clear path. Avoid moving your furniture around.  If any of your floors are uneven, fix them.  If there are any pets around you, be aware of where they are.  Review your medicines with your doctor. Some medicines can make you feel dizzy. This can increase your chance of falling. Ask your doctor what other things that you can do to help prevent falls. This information is not intended to replace advice given to you by your health care provider. Make sure you discuss any questions you have with your health care provider. Document Released: 05/26/2009 Document Revised: 01/05/2016 Document Reviewed: 09/03/2014 Elsevier Interactive Patient Education  2017 [employer].

## 2019-09-29 NOTE — Patient Instructions (Signed)
It was very nice to see you today!  Please start the Lexapro.  Check in with me in a few weeks let me know how it is working for you.  Please continue to cut down on the alcohol use.  Come back in 1 year for your next physical with blood work, or sooner if needed.  Take care, Dr Parker  Please try these tips to maintain a healthy lifestyle:   Eat at least 3 REAL meals and 1-2 snacks per day.  Aim for no more than 5 hours between eating.  If you eat breakfast, please do so within one hour of getting up.    Each meal should contain half fruits/vegetables, one quarter protein, and one quarter carbs (no bigger than a computer mouse)   Cut down on sweet beverages. This includes juice, soda, and sweet tea.     Drink at least 1 glass of water with each meal and aim for at least 8 glasses per day   Exercise at least 150 minutes every week.    Preventive Care 65 Years and Older, Male Preventive care refers to lifestyle choices and visits with your health care provider that can promote health and wellness. This includes:  A yearly physical exam. This is also called an annual well check.  Regular dental and eye exams.  Immunizations.  Screening for certain conditions.  Healthy lifestyle choices, such as diet and exercise. What can I expect for my preventive care visit? Physical exam Your health care provider will check:  Height and weight. These may be used to calculate body mass index (BMI), which is a measurement that tells if you are at a healthy weight.  Heart rate and blood pressure.  Your skin for abnormal spots. Counseling Your health care provider may ask you questions about:  Alcohol, tobacco, and drug use.  Emotional well-being.  Home and relationship well-being.  Sexual activity.  Eating habits.  History of falls.  Memory and ability to understand (cognition).  Work and work environment. What immunizations do I need?  Influenza (flu)  vaccine  This is recommended every year. Tetanus, diphtheria, and pertussis (Tdap) vaccine  You may need a Td booster every 10 years. Varicella (chickenpox) vaccine  You may need this vaccine if you have not already been vaccinated. Zoster (shingles) vaccine  You may need this after age 60. Pneumococcal conjugate (PCV13) vaccine  One dose is recommended after age 65. Pneumococcal polysaccharide (PPSV23) vaccine  One dose is recommended after age 65. Measles, mumps, and rubella (MMR) vaccine  You may need at least one dose of MMR if you were born in 1957 or later. You may also need a second dose. Meningococcal conjugate (MenACWY) vaccine  You may need this if you have certain conditions. Hepatitis A vaccine  You may need this if you have certain conditions or if you travel or work in places where you may be exposed to hepatitis A. Hepatitis B vaccine  You may need this if you have certain conditions or if you travel or work in places where you may be exposed to hepatitis B. Haemophilus influenzae type b (Hib) vaccine  You may need this if you have certain conditions. You may receive vaccines as individual doses or as more than one vaccine together in one shot (combination vaccines). Talk with your health care provider about the risks and benefits of combination vaccines. What tests do I need? Blood tests  Lipid and cholesterol levels. These may be checked every 5 years,   or more frequently depending on your overall health.  Hepatitis C test.  Hepatitis B test. Screening  Lung cancer screening. You may have this screening every year starting at age 55 if you have a 30-pack-year history of smoking and currently smoke or have quit within the past 15 years.  Colorectal cancer screening. All adults should have this screening starting at age 50 and continuing until age 75. Your health care provider may recommend screening at age 45 if you are at increased risk. You will have  tests every 1-10 years, depending on your results and the type of screening test.  Prostate cancer screening. Recommendations will vary depending on your family history and other risks.  Diabetes screening. This is done by checking your blood sugar (glucose) after you have not eaten for a while (fasting). You may have this done every 1-3 years.  Abdominal aortic aneurysm (AAA) screening. You may need this if you are a current or former smoker.  Sexually transmitted disease (STD) testing. Follow these instructions at home: Eating and drinking  Eat a diet that includes fresh fruits and vegetables, whole grains, lean protein, and low-fat dairy products. Limit your intake of foods with high amounts of sugar, saturated fats, and salt.  Take vitamin and mineral supplements as recommended by your health care provider.  Do not drink alcohol if your health care provider tells you not to drink.  If you drink alcohol: ? Limit how much you have to 0-2 drinks a day. ? Be aware of how much alcohol is in your drink. In the [place] one drink equals one 12 oz bottle of beer (355 mL), one 5 oz glass of wine (148 mL), or one 1 oz glass of hard liquor (44 mL). Lifestyle  Take daily care of your teeth and gums.  Stay active. Exercise for at least 30 minutes on 5 or more days each week.  Do not use any products that contain nicotine or tobacco, such as cigarettes, e-cigarettes, and chewing tobacco. If you need help quitting, ask your health care provider.  If you are sexually active, practice safe sex. Use a condom or other form of protection to prevent STIs (sexually transmitted infections).  Talk with your health care provider about taking a low-dose aspirin or statin. What's next?  Visit your health care provider once a year for a well check visit.  Ask your health care provider how often you should have your eyes and teeth checked.  Stay up to date on all vaccines. This information is not  intended to replace advice given to you by your health care provider. Make sure you discuss any questions you have with your health care provider. Document Revised: 07/24/2018 Document Reviewed: 07/24/2018 Elsevier Patient Education  2020 Elsevier Inc.  

## 2019-09-29 NOTE — Progress Notes (Signed)
Chief Complaint:  Cameron Patton is a 74 y.o. male who presents today for his annual comprehensive physical exam.  He is is also establishing care today.   Assessment/Plan:  Chronic Problems Addressed Today: Atrial fibrillation (HCC) Continue digoxin and warfarin per cardiology.   Alcohol abuse Encouraged cessation. He is actively working on cutting down.   Allergic rhinitis Stable. Continue OTC medications.   Depression Worsening. Discussed treatment options. We will start lexapro 10mg  daily.  He will follow-up with me in a few weeks via MyChart.  Preventative Healthcare: Declined blood work today.  Deferred colon cancer screening.  Up-to-date on flu vaccine.   Patient Counseling(The following topics were reviewed and/or handout was given):  -Nutrition: Stressed importance of moderation in sodium/caffeine intake, saturated fat and cholesterol, caloric balance, sufficient intake of fresh fruits, vegetables, and fiber.  -Stressed the importance of regular exercise.   -Substance Abuse: Discussed cessation/primary prevention of tobacco, alcohol, or other drug use; driving or other dangerous activities under the influence; availability of treatment for abuse.   -Injury prevention: Discussed safety belts, safety helmets, smoke detector, smoking near bedding or upholstery.   -Sexuality: Discussed sexually transmitted diseases, partner selection, use of condoms, avoidance of unintended pregnancy and contraceptive alternatives.   -Dental health: Discussed importance of regular tooth brushing, flossing, and dental visits.  -Health maintenance and immunizations reviewed. Please refer to Health maintenance section.  Return to care in 1 year for next preventative visit.     Subjective:  HPI:  He has had worsening depression over the last few months. Has been trying to cut down on alcohol use as well. 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 Trying to cut down going to the bar from  5 days per week to 3 days per week. Thinks that he was on lexapro in the past.   His stable, chronic medical conditions are outlined below:  # Atrial Fibrillation - Follows with cardiology - On digoxin daily and anticoagulated with warfarin  Lifestyle Diet: None specific.  Exercise: Works out at several days per week.   Depression screen PHQ 2/9 09/29/2019  Decreased Interest 2  Down, Depressed, Hopeless 3  PHQ - 2 Score 5  Altered sleeping 3  Tired, decreased energy 3  Change in appetite 2  Feeling bad or failure about yourself  3  Trouble concentrating 3  Moving slowly or fidgety/restless 2  Suicidal thoughts 1  PHQ-9 Score 22  Difficult doing work/chores Somewhat difficult    Health Maintenance Due  Topic Date Due  . Hepatitis C Screening  1945-09-15  . PNA vac Low Risk Adult (2 of 2 - PPSV23) 05/17/2016     ROS: Per HPI, otherwise a complete review of systems was negative.   PMH:  The following were reviewed and entered/updated in epic: Past Medical History:  Diagnosis Date  . Allergic rhinitis   . Atrial fibrillation (HCC)   . History of ETOH abuse   . Hx of degenerative disc disease    Patient Active Problem List   Diagnosis Date Noted  . Alcohol abuse 02/27/2018  . Depression 02/27/2018  . Long term (current) use of anticoagulants [Z79.01] 02/28/2016  . Hx of degenerative disc disease 12/22/2015  . Atrial fibrillation (HCC) 12/22/2015  . Allergic rhinitis 12/22/2015   Past Surgical History:  Procedure Laterality Date  . CATARACT EXTRACTION BILATERAL W/ ANTERIOR VITRECTOMY Bilateral 2015  . INGUINAL HERNIA REPAIR Right 1982    Family History  Problem Relation Age of Onset  . Alcoholism Mother        Died age 44  . Stroke Brother   . Cancer Brother        Liver/lung    Medications- reviewed and updated Current Outpatient Medications  Medication Sig Dispense Refill  . digoxin (DIGOX) 0.125 MG tablet Take 1 tablet (125 mcg  total) by mouth daily. 30 tablet 9  . Melatonin 3 MG TABS Take by mouth.    . warfarin (COUMADIN) 3 MG tablet TAKE 1/2 TO 1 TABLET DAILY AS DIRECTED BY COUMADIN CLINIC. 90 tablet 0  . escitalopram (LEXAPRO) 10 MG tablet Take 1 tablet (10 mg total) by mouth daily. 30 tablet 5   No current facility-administered medications for this visit.    Allergies-reviewed and updated No Known Allergies  Social History   Socioeconomic History  . Marital status: Single    Spouse name: Not on file  . Number of children: 2  . Years of education: Not on file  . Highest education level: Not on file  Occupational History  . Occupation: Retired   Tobacco Use  . Smoking status: Never Smoker  . Smokeless tobacco: Never Used  Substance and Sexual Activity  . Alcohol use: Yes    Alcohol/week: 16.0 standard drinks    Types: 12 Cans of beer, 4 Shots of liquor per week  . Drug use: Not on file  . Sexual activity: Not on file  Other Topics Concern  . Not on file  Social History Narrative   Relocated to [facility] from [place] 08/2015 months ago after significant hospital stay with UTI leading to sepsis. Went to rehab for a few months afterwards. Brother is living in [place].    Social Determinants of Health   Financial Resource Strain:   . Difficulty of Paying Living Expenses: Not on file  Food Insecurity:   . Worried About Charity fundraiser in the Last Year: Not on file  . Ran Out of Food in the Last Year: Not on file  Transportation Needs:   . Lack of Transportation (Medical): Not on file  . Lack of Transportation (Non-Medical): Not on file  Physical Activity:   . Days of Exercise per Week: Not on file  . Minutes of Exercise per Session: Not on file  Stress:   . Feeling of Stress : Not on file  Social Connections:   . Frequency of Communication with Friends and Family: Not on file  . Frequency of Social Gatherings with Friends and Family: Not on file  . Attends Religious Services: Not  on file  . Active Member of Clubs or Organizations: Not on file  . Attends Archivist Meetings: Not on file  . Marital Status: Not on file        Objective:  Physical Exam: BP 110/68   Pulse (!) 112   Temp 98.7 F (37.1 C) (Temporal)   Ht 5\' 9"  (1.753 m)   Wt 175 lb 9.6 oz (79.7 kg)   SpO2 (!) 86%   BMI 25.93 kg/m   Body mass index is 25.93 kg/m. Wt Readings from Last 3 Encounters:  09/29/19 175 lb 9.6 oz (79.7 kg)  06/04/19 181 lb 9.6 oz (82.4 kg)  02/27/18 181 lb (82.1 kg)   Gen: NAD, resting comfortably HEENT: TMs normal bilaterally. OP clear. No thyromegaly noted.  CV: RRR with no murmurs appreciated Pulm: NWOB, CTAB with no crackles, wheezes, or rhonchi GI: Normal bowel sounds present. Soft, Nontender,  Nondistended. MSK: no edema, cyanosis, or clubbing noted Skin: warm, dry Neuro: CN2-12 grossly intact. Strength 5/5 in upper and lower extremities. Reflexes symmetric and intact bilaterally.  Psych: Normal affect and thought content     Ulysees Robarts M. Jimmey Ralph, MD 09/29/2019 10:48 AM

## 2019-09-29 NOTE — Assessment & Plan Note (Signed)
Encouraged cessation. He is actively working on cutting down.

## 2019-09-29 NOTE — Assessment & Plan Note (Signed)
Stable. Continue OTC medications.

## 2019-10-17 ENCOUNTER — Encounter: Payer: Self-pay | Admitting: Family Medicine

## 2019-11-04 ENCOUNTER — Ambulatory Visit (INDEPENDENT_AMBULATORY_CARE_PROVIDER_SITE_OTHER): Payer: Medicare Other | Admitting: Pharmacist Clinician (PhC)/ Clinical Pharmacy Specialist

## 2019-11-04 ENCOUNTER — Other Ambulatory Visit: Payer: Self-pay

## 2019-11-04 DIAGNOSIS — Z7901 Long term (current) use of anticoagulants: Secondary | ICD-10-CM | POA: Diagnosis not present

## 2019-11-04 DIAGNOSIS — I4891 Unspecified atrial fibrillation: Secondary | ICD-10-CM | POA: Diagnosis not present

## 2019-11-04 LAB — POCT INR: INR: 4.1 — AB (ref 2.0–3.0)

## 2019-11-13 ENCOUNTER — Other Ambulatory Visit: Payer: Self-pay | Admitting: Cardiology

## 2019-11-17 ENCOUNTER — Telehealth: Payer: Self-pay | Admitting: Family Medicine

## 2019-11-17 NOTE — Telephone Encounter (Signed)
Patient called in asking if Dr.Parker could prescribe him something for alcohol withdraw, states he was not comfortable with the escitalopram (LEXAPRO) 10 MG tablet and thinks his depression is part of his alcohol. Would like a returned call or a mychart message to discuss this further.

## 2019-11-18 NOTE — Telephone Encounter (Signed)
Please call patient to schedule appointment.

## 2019-11-18 NOTE — Telephone Encounter (Signed)
Called patient to offer patient OV. Patient decline the offer. Patient states that he was previously here on 09/29/19 and discuss this information with Dr. Jimmey Ralph. Patient states that he is trying to completely stop drinking alcohol but he would like some assistance with alcohol withdraw.

## 2019-11-18 NOTE — Telephone Encounter (Signed)
Recommend OV.  Cameron Patton. Jimmey Ralph, MD 11/18/2019 10:01 AM

## 2019-11-18 NOTE — Telephone Encounter (Signed)
Please advise 

## 2019-11-18 NOTE — Telephone Encounter (Signed)
Spoke with patient, advised and offer to schedule appointment with Dr Jimmey Ralph  Pt stated will call back to schedule appointment

## 2019-11-23 ENCOUNTER — Encounter: Payer: Self-pay | Admitting: Family Medicine

## 2019-11-23 ENCOUNTER — Ambulatory Visit (INDEPENDENT_AMBULATORY_CARE_PROVIDER_SITE_OTHER): Payer: Medicare Other | Admitting: Family Medicine

## 2019-11-23 ENCOUNTER — Other Ambulatory Visit: Payer: Self-pay

## 2019-11-23 VITALS — BP 130/80 | HR 90 | Temp 97.7°F | Ht 69.0 in | Wt 172.4 lb

## 2019-11-23 DIAGNOSIS — F329 Major depressive disorder, single episode, unspecified: Secondary | ICD-10-CM | POA: Diagnosis not present

## 2019-11-23 DIAGNOSIS — F1010 Alcohol abuse, uncomplicated: Secondary | ICD-10-CM

## 2019-11-23 DIAGNOSIS — F32A Depression, unspecified: Secondary | ICD-10-CM

## 2019-11-23 MED ORDER — NALTREXONE HCL 50 MG PO TABS: 50.0000 mg | ORAL_TABLET | Freq: Every day | ORAL | 3 refills | Status: DC

## 2019-11-23 NOTE — Assessment & Plan Note (Signed)
Encouraged cessation. Will start naltrexone 50mg  daily. Discussed counseling/therapy and gave behavioral health handout. He will be trying to cut back to going to the bar to 2 times per week. He will check in with me in a few weeks via mychart.

## 2019-11-23 NOTE — Progress Notes (Signed)
   Cameron Patton is a 74 y.o. male who presents today for an office visit.  Assessment/Plan:  Chronic Problems Addressed Today: Depression Did not tolerate lexapro. Will treat alcohol abuse as noted below. He will check in with me in a few weeks. Would consider trial of wellbutrin depending on response to naltrexone.   Alcohol abuse Encouraged cessation. Will start naltrexone 50mg  daily. Discussed counseling/therapy and gave behavioral health handout. He will be trying to cut back to going to the bar to 2 times per week. He will check in with me in a few weeks via mychart.      Subjective:  HPI:  Patient her for follow up. He was started on lexapro a couple of months ago.  Unfortunately, lexapro did not work for him. Had side effects including drowsiness and blunted affect. Did not think that was helping with is depressive symptoms.   He would also like help cutting down on drinking. He is currently going to the bar 4-5 times per week and drinking 5-7 beers and 3-4 shots per each night he goes. Has had multiple DUIs in the past and has been in inpatient detox in the past. He is interested in starting naltrexone.        Objective:  Physical Exam: BP 130/80 (BP Location: Left Arm, Patient Position: Sitting, Cuff Size: Normal)   Pulse 90   Temp 97.7 F (36.5 C) (Temporal)   Ht 5\' 9"  (1.753 m)   Wt 172 lb 6.4 oz (78.2 kg)   SpO2 99%   BMI 25.46 kg/m   Gen: No acute distress, resting comfortably Neuro: Grossly normal, moves all extremities Psych: Normal affect and thought content  Time Spent: 50 minutes of total time was spent on the date of the encounter performing the following actions: chart review prior to seeing the patient, obtaining history, performing a medically necessary exam, counseling on the treatment plan, placing orders, and documenting in our EHR.       . , MD 11/23/2019 10:30 AM

## 2019-11-23 NOTE — Patient Instructions (Addendum)
It was very nice to see you today!  Please start the naltrexone.  Check in with me in a few weeks to let me know how it is working.   Take care, Dr Jimmey Ralph  Please try these tips to maintain a healthy lifestyle:   Eat at least 3 REAL meals and 1-2 snacks per day.  Aim for no more than 5 hours between eating.  If you eat breakfast, please do so within one hour of getting up.    Each meal should contain half fruits/vegetables, one quarter protein, and one quarter carbs (no bigger than a computer mouse)   Cut down on sweet beverages. This includes juice, soda, and sweet tea.     Drink at least 1 glass of water with each meal and aim for at least 8 glasses per day   Exercise at least 150 minutes every week.

## 2019-11-23 NOTE — Assessment & Plan Note (Signed)
Did not tolerate lexapro. Will treat alcohol abuse as noted below. He will check in with me in a few weeks. Would consider trial of wellbutrin depending on response to naltrexone.

## 2019-11-24 NOTE — Telephone Encounter (Signed)
Please advise 

## 2019-11-24 NOTE — Telephone Encounter (Signed)
Per Dr Jimmey Ralph Recommendation trying half a pill. Side effects should get better after a few days as his body adjusts. Pt aware of recommendation will try half pill.

## 2019-12-07 ENCOUNTER — Encounter: Payer: Self-pay | Admitting: Family Medicine

## 2019-12-09 ENCOUNTER — Ambulatory Visit (INDEPENDENT_AMBULATORY_CARE_PROVIDER_SITE_OTHER): Payer: Medicare Other | Admitting: Pharmacist

## 2019-12-09 ENCOUNTER — Other Ambulatory Visit: Payer: Self-pay

## 2019-12-09 DIAGNOSIS — Z7901 Long term (current) use of anticoagulants: Secondary | ICD-10-CM | POA: Diagnosis not present

## 2019-12-09 DIAGNOSIS — I4891 Unspecified atrial fibrillation: Secondary | ICD-10-CM | POA: Diagnosis not present

## 2019-12-09 LAB — POCT INR: INR: 2 (ref 2.0–3.0)

## 2020-02-05 ENCOUNTER — Ambulatory Visit (INDEPENDENT_AMBULATORY_CARE_PROVIDER_SITE_OTHER): Payer: Medicare Other | Admitting: Pharmacist

## 2020-02-05 ENCOUNTER — Other Ambulatory Visit: Payer: Self-pay

## 2020-02-05 DIAGNOSIS — Z7901 Long term (current) use of anticoagulants: Secondary | ICD-10-CM

## 2020-02-05 DIAGNOSIS — I4891 Unspecified atrial fibrillation: Secondary | ICD-10-CM

## 2020-02-05 LAB — POCT INR: INR: 5.6 — AB (ref 2.0–3.0)

## 2020-02-22 ENCOUNTER — Other Ambulatory Visit: Payer: Self-pay

## 2020-02-22 ENCOUNTER — Ambulatory Visit (INDEPENDENT_AMBULATORY_CARE_PROVIDER_SITE_OTHER): Payer: Medicare Other | Admitting: Pharmacist Clinician (PhC)/ Clinical Pharmacy Specialist

## 2020-02-22 DIAGNOSIS — I4891 Unspecified atrial fibrillation: Secondary | ICD-10-CM | POA: Diagnosis not present

## 2020-02-22 DIAGNOSIS — Z7901 Long term (current) use of anticoagulants: Secondary | ICD-10-CM

## 2020-02-22 LAB — POCT INR: INR: 3.2 — AB (ref 2.0–3.0)

## 2020-03-03 ENCOUNTER — Other Ambulatory Visit: Payer: Self-pay | Admitting: Cardiology

## 2020-03-25 ENCOUNTER — Ambulatory Visit (INDEPENDENT_AMBULATORY_CARE_PROVIDER_SITE_OTHER): Payer: Medicare Other

## 2020-03-25 ENCOUNTER — Other Ambulatory Visit: Payer: Self-pay

## 2020-03-25 DIAGNOSIS — I4891 Unspecified atrial fibrillation: Secondary | ICD-10-CM | POA: Diagnosis not present

## 2020-03-25 DIAGNOSIS — Z7901 Long term (current) use of anticoagulants: Secondary | ICD-10-CM | POA: Diagnosis not present

## 2020-03-25 LAB — POCT INR: INR: 2.3 (ref 2.0–3.0)

## 2020-03-25 NOTE — Patient Instructions (Signed)
Continue taking 1 tablet daily except 1/2 tablet on Sundays, Tuesdays and Thursdays. Repeat INR in 6 weeks.

## 2020-05-11 ENCOUNTER — Other Ambulatory Visit: Payer: Self-pay

## 2020-05-11 ENCOUNTER — Ambulatory Visit (INDEPENDENT_AMBULATORY_CARE_PROVIDER_SITE_OTHER): Payer: Medicare Other

## 2020-05-11 DIAGNOSIS — Z7901 Long term (current) use of anticoagulants: Secondary | ICD-10-CM

## 2020-05-11 DIAGNOSIS — I4891 Unspecified atrial fibrillation: Secondary | ICD-10-CM

## 2020-05-11 LAB — POCT INR: INR: 2.1 (ref 2.0–3.0)

## 2020-05-11 NOTE — Patient Instructions (Signed)
Continue taking 1 tablet daily except 1/2 tablet on Sundays, Tuesdays and Thursdays. Repeat INR in 6 weeks.  

## 2020-05-16 ENCOUNTER — Other Ambulatory Visit: Payer: Self-pay | Admitting: Cardiology

## 2020-05-31 ENCOUNTER — Encounter (HOSPITAL_COMMUNITY): Payer: Self-pay

## 2020-05-31 ENCOUNTER — Other Ambulatory Visit: Payer: Self-pay

## 2020-05-31 ENCOUNTER — Observation Stay (HOSPITAL_COMMUNITY)
Admission: EM | Admit: 2020-05-31 | Discharge: 2020-05-31 | Disposition: A | Discharge status: Home / Self Care | Payer: Medicare Other | Attending: Physician Assistant | Admitting: Physician Assistant

## 2020-05-31 ENCOUNTER — Other Ambulatory Visit (HOSPITAL_COMMUNITY): Payer: Self-pay | Admitting: Physician Assistant

## 2020-05-31 ENCOUNTER — Emergency Department (HOSPITAL_COMMUNITY): Payer: Medicare Other

## 2020-05-31 DIAGNOSIS — S2241XA Multiple fractures of ribs, right side, initial encounter for closed fracture: Secondary | ICD-10-CM | POA: Diagnosis present

## 2020-05-31 DIAGNOSIS — W19XXXA Unspecified fall, initial encounter: Principal | ICD-10-CM

## 2020-05-31 DIAGNOSIS — S27321A Contusion of lung, unilateral, initial encounter: Secondary | ICD-10-CM

## 2020-05-31 DIAGNOSIS — J3489 Other specified disorders of nose and nasal sinuses: Secondary | ICD-10-CM | POA: Diagnosis not present

## 2020-05-31 DIAGNOSIS — Y92481 Parking lot as the place of occurrence of the external cause: Secondary | ICD-10-CM | POA: Insufficient documentation

## 2020-05-31 DIAGNOSIS — Z20822 Contact with and (suspected) exposure to covid-19: Secondary | ICD-10-CM | POA: Insufficient documentation

## 2020-05-31 DIAGNOSIS — S199XXA Unspecified injury of neck, initial encounter: Secondary | ICD-10-CM | POA: Diagnosis not present

## 2020-05-31 DIAGNOSIS — J90 Pleural effusion, not elsewhere classified: Secondary | ICD-10-CM | POA: Diagnosis not present

## 2020-05-31 DIAGNOSIS — I517 Cardiomegaly: Secondary | ICD-10-CM | POA: Diagnosis not present

## 2020-05-31 DIAGNOSIS — I4891 Unspecified atrial fibrillation: Secondary | ICD-10-CM | POA: Diagnosis not present

## 2020-05-31 DIAGNOSIS — R519 Headache, unspecified: Secondary | ICD-10-CM | POA: Diagnosis not present

## 2020-05-31 DIAGNOSIS — W1839XA Other fall on same level, initial encounter: Secondary | ICD-10-CM | POA: Insufficient documentation

## 2020-05-31 DIAGNOSIS — T699XXA Effect of reduced temperature, unspecified, initial encounter: Secondary | ICD-10-CM | POA: Diagnosis not present

## 2020-05-31 DIAGNOSIS — S3991XA Unspecified injury of abdomen, initial encounter: Secondary | ICD-10-CM | POA: Diagnosis not present

## 2020-05-31 DIAGNOSIS — S0990XA Unspecified injury of head, initial encounter: Secondary | ICD-10-CM | POA: Diagnosis not present

## 2020-05-31 DIAGNOSIS — Z7901 Long term (current) use of anticoagulants: Secondary | ICD-10-CM | POA: Insufficient documentation

## 2020-05-31 DIAGNOSIS — M545 Low back pain, unspecified: Secondary | ICD-10-CM | POA: Insufficient documentation

## 2020-05-31 DIAGNOSIS — M19031 Primary osteoarthritis, right wrist: Secondary | ICD-10-CM | POA: Diagnosis not present

## 2020-05-31 DIAGNOSIS — Z743 Need for continuous supervision: Secondary | ICD-10-CM | POA: Diagnosis not present

## 2020-05-31 DIAGNOSIS — F1010 Alcohol abuse, uncomplicated: Secondary | ICD-10-CM | POA: Insufficient documentation

## 2020-05-31 DIAGNOSIS — S6991XA Unspecified injury of right wrist, hand and finger(s), initial encounter: Secondary | ICD-10-CM | POA: Diagnosis not present

## 2020-05-31 DIAGNOSIS — M25519 Pain in unspecified shoulder: Secondary | ICD-10-CM | POA: Diagnosis not present

## 2020-05-31 DIAGNOSIS — S299XXA Unspecified injury of thorax, initial encounter: Secondary | ICD-10-CM | POA: Diagnosis present

## 2020-05-31 HISTORY — DX: Unspecified atrial fibrillation: I48.91

## 2020-05-31 LAB — COMPREHENSIVE METABOLIC PANEL
ALT: 30 U/L (ref 0–44)
AST: 50 U/L — ABNORMAL HIGH (ref 15–41)
Albumin: 4 g/dL (ref 3.5–5.0)
Alkaline Phosphatase: 69 U/L (ref 38–126)
Anion gap: 13 (ref 5–15)
BUN: 9 mg/dL (ref 8–23)
CO2: 26 mmol/L (ref 22–32)
Calcium: 9.4 mg/dL (ref 8.9–10.3)
Chloride: 97 mmol/L — ABNORMAL LOW (ref 98–111)
Creatinine, Ser: 0.99 mg/dL (ref 0.61–1.24)
GFR, Estimated: >60 mL/min (ref >60)
Glucose, Bld: 116 mg/dL — ABNORMAL HIGH (ref 70–99)
Potassium: 4.1 mmol/L (ref 3.5–5.1)
Sodium: 136 mmol/L (ref 135–145)
Total Bilirubin: 0.8 mg/dL (ref 0.3–1.2)
Total Protein: 7.4 g/dL (ref 6.5–8.1)

## 2020-05-31 LAB — CBC WITH DIFFERENTIAL/PLATELET
Abs Immature Granulocytes: 0.05 10*3/uL (ref 0.00–0.07)
Basophils Absolute: 0 10*3/uL (ref 0.0–0.1)
Basophils Relative: 0 %
Eosinophils Absolute: 0.1 10*3/uL (ref 0.0–0.5)
Eosinophils Relative: 2 %
HCT: 41 % (ref 39.0–52.0)
Hemoglobin: 13.4 g/dL (ref 13.0–17.0)
Immature Granulocytes: 1 %
Lymphocytes Relative: 37 %
Lymphs Abs: 2.7 10*3/uL (ref 0.7–4.0)
MCH: 32 pg (ref 26.0–34.0)
MCHC: 32.7 g/dL (ref 30.0–36.0)
MCV: 97.9 fL (ref 80.0–100.0)
Monocytes Absolute: 0.6 10*3/uL (ref 0.1–1.0)
Monocytes Relative: 8 %
Neutro Abs: 3.9 10*3/uL (ref 1.7–7.7)
Neutrophils Relative %: 52 %
Platelets: 265 10*3/uL (ref 150–400)
RBC: 4.19 MIL/uL — ABNORMAL LOW (ref 4.22–5.81)
RDW: 12.5 % (ref 11.5–15.5)
WBC: 7.4 10*3/uL (ref 4.0–10.5)
nRBC: 0 % (ref 0.0–0.2)

## 2020-05-31 LAB — DIGOXIN LEVEL: Digoxin Level: 0.6 ng/mL — ABNORMAL LOW (ref 1.0–2.0)

## 2020-05-31 LAB — RESPIRATORY PANEL BY RT PCR (FLU A&B, COVID)
Influenza A by PCR: NEGATIVE
Influenza B by PCR: NEGATIVE
SARS Coronavirus 2 by RT PCR: NEGATIVE

## 2020-05-31 LAB — PROTIME-INR
INR: 2.3 — ABNORMAL HIGH (ref 0.8–1.2)
Prothrombin Time: 24.6 seconds — ABNORMAL HIGH (ref 11.4–15.2)

## 2020-05-31 LAB — ETHANOL: Alcohol, Ethyl (B): 244 mg/dL — ABNORMAL HIGH (ref <10)

## 2020-05-31 MED ORDER — LORAZEPAM 1 MG PO TABS: 0.0000 mg | ORAL_TABLET | Freq: Four times a day (QID) | ORAL | Status: DC

## 2020-05-31 MED ORDER — ENOXAPARIN SODIUM 30 MG/0.3ML ~~LOC~~ SOLN: 30.0000 mg | Freq: Two times a day (BID) | SUBCUTANEOUS | Status: DC

## 2020-05-31 MED ORDER — OXYCODONE HCL 5 MG PO TABS
5.0000 mg | ORAL_TABLET | ORAL | Status: DC | PRN
  Administered 2020-05-31: 5 mg via ORAL
  Filled 2020-05-31: qty 1

## 2020-05-31 MED ORDER — IOHEXOL 300 MG/ML  SOLN
100.0000 mL | Freq: Once | INTRAMUSCULAR | Status: AC | PRN
  Administered 2020-05-31: 100 mL via INTRAVENOUS

## 2020-05-31 MED ORDER — LORAZEPAM 1 MG PO TABS: 0.0000 mg | ORAL_TABLET | Freq: Two times a day (BID) | ORAL | Status: DC

## 2020-05-31 MED ORDER — THIAMINE HCL 100 MG/ML IJ SOLN: 100.0000 mg | Freq: Every day | INTRAMUSCULAR | Status: DC

## 2020-05-31 MED ORDER — THIAMINE HCL 100 MG PO TABS
100.0000 mg | ORAL_TABLET | Freq: Every day | ORAL | Status: DC
  Administered 2020-05-31: 100 mg via ORAL

## 2020-05-31 MED ORDER — OXYCODONE HCL 5 MG PO TABS
5.0000 mg | ORAL_TABLET | Freq: Four times a day (QID) | ORAL | 0 refills | Status: DC | PRN
Start: 2020-05-31 — End: 2020-11-03

## 2020-05-31 MED ORDER — OXYCODONE HCL 5 MG PO TABS: 10.0000 mg | ORAL_TABLET | ORAL | Status: DC | PRN

## 2020-05-31 MED ORDER — LORAZEPAM 2 MG/ML IJ SOLN: 0.0000 mg | Freq: Two times a day (BID) | INTRAMUSCULAR | Status: DC

## 2020-05-31 MED ORDER — METOPROLOL TARTRATE 5 MG/5ML IV SOLN: 5.0000 mg | Freq: Four times a day (QID) | INTRAVENOUS | Status: DC | PRN

## 2020-05-31 MED ORDER — DIGOXIN 125 MCG PO TABS
125.0000 ug | ORAL_TABLET | Freq: Every day | ORAL | Status: DC
  Administered 2020-05-31: 125 ug via ORAL
  Filled 2020-05-31: qty 1

## 2020-05-31 MED ORDER — SODIUM CHLORIDE 0.9 % IV SOLN: INTRAVENOUS | Status: DC

## 2020-05-31 MED ORDER — METHOCARBAMOL 500 MG PO TABS
500.0000 mg | ORAL_TABLET | Freq: Three times a day (TID) | ORAL | 0 refills | Status: DC
Start: 2020-05-31 — End: 2020-11-03

## 2020-05-31 MED ORDER — ONDANSETRON 4 MG PO TBDP: 4.0000 mg | ORAL_TABLET | Freq: Four times a day (QID) | ORAL | Status: DC | PRN

## 2020-05-31 MED ORDER — LORAZEPAM 2 MG/ML IJ SOLN: 0.0000 mg | Freq: Four times a day (QID) | INTRAMUSCULAR | Status: DC

## 2020-05-31 MED ORDER — LORAZEPAM 1 MG PO TABS: 1.0000 mg | ORAL_TABLET | ORAL | Status: DC | PRN

## 2020-05-31 MED ORDER — MORPHINE SULFATE (PF) 2 MG/ML IV SOLN: 1.0000 mg | INTRAVENOUS | Status: DC | PRN

## 2020-05-31 MED ORDER — ACETAMINOPHEN 500 MG PO TABS
1000.0000 mg | ORAL_TABLET | Freq: Four times a day (QID) | ORAL | Status: DC
  Filled 2020-05-31: qty 2

## 2020-05-31 MED ORDER — ADULT MULTIVITAMIN W/MINERALS CH: 1.0000 | ORAL_TABLET | Freq: Every day | ORAL | Status: DC

## 2020-05-31 MED ORDER — LORAZEPAM 2 MG/ML IJ SOLN: 1.0000 mg | INTRAMUSCULAR | Status: DC | PRN

## 2020-05-31 MED ORDER — FOLIC ACID 1 MG PO TABS: 1.0000 mg | ORAL_TABLET | Freq: Every day | ORAL | Status: DC

## 2020-05-31 MED ORDER — ONDANSETRON HCL 4 MG/2ML IJ SOLN: 4.0000 mg | Freq: Four times a day (QID) | INTRAMUSCULAR | Status: DC | PRN

## 2020-05-31 MED ORDER — THIAMINE HCL 100 MG PO TABS
100.0000 mg | ORAL_TABLET | Freq: Every day | ORAL | Status: DC
  Filled 2020-05-31: qty 1

## 2020-05-31 MED ORDER — ACETAMINOPHEN 500 MG PO TABS: 1000.0000 mg | ORAL_TABLET | Freq: Three times a day (TID) | ORAL | Status: DC | PRN

## 2020-05-31 MED ORDER — METHOCARBAMOL 500 MG PO TABS
500.0000 mg | ORAL_TABLET | Freq: Three times a day (TID) | ORAL | Status: DC
  Administered 2020-05-31 (×2): 500 mg via ORAL
  Filled 2020-05-31 (×2): qty 1

## 2020-05-31 MED FILL — METHOCARBAMOL 500 MG TABS: 500 | 20 days supply | Qty: 60 | Fill #0

## 2020-05-31 MED FILL — oxyCODONE HCL 5 MG TABS: 5 | 5 days supply | Qty: 20 | Fill #0

## 2020-05-31 NOTE — ED Notes (Signed)
Pt returned from xray, pt c/o R lower back pain with movement from CT to stretcher

## 2020-05-31 NOTE — ED Provider Notes (Signed)
[facility] EMERGENCY DEPARTMENT Provider Note   CSN: [id number] Arrival date & time: 05/31/20  0016     History Chief Complaint  Patient presents with  . Fall    Cameron Patton is a 74 y.o. male.  HPI     This is a 74 year old male with a history of atrial fibrillation on Coumadin who presents by EMS as a level 2 trauma after fall.  Patient was found in a parking lot with a small laceration to the eye.  He endorsed heavy drinking this evening.  Patient cannot provide any history and repetitively states "I do not know what happened."  He is complaining of headache and mid back pain.  He reports that he is trying to cut back drinking but drink 4 or 5 beers earlier this evening.  Denies chest pain, shortness of breath, abdominal pain.  Level 5 caveat for acuity of condition  No past medical history on file.  There are no problems to display for this patient.  PMH Atrial Fibrillation  No family history on file.  Social History   Tobacco Use  . Smoking status: Not on file  Substance Use Topics  . Alcohol use: Not on file  . Drug use: Not on file    Home Medications Prior to Admission medications   Not on File    Allergies    Patient has no known allergies.  Review of Systems   Review of Systems  Respiratory: Negative for shortness of breath.   Cardiovascular: Negative for chest pain.  Gastrointestinal: Negative for abdominal pain, nausea and vomiting.  Musculoskeletal: Positive for back pain.  Skin: Positive for wound.  Neurological: Positive for headaches.  All other systems reviewed and are negative.   Physical Exam Updated Vital Signs BP 111/70   Pulse 81   Temp (!) 97 F (36.1 C) (Tympanic)   Resp 17   Ht 1.778 m (5\' 10" )   Wt 77 kg   SpO2 91%   BMI 24.36 kg/m   Physical Exam Vitals and nursing note reviewed.  Constitutional:      Appearance: He is well-developed.     Comments: ABCs intact, obviously intoxicated  HENT:       Head: Normocephalic.     Comments: Superficial 2 cm laceration just lateral to the right eyebrow, no active bleeding, no significant ecchymosis    Nose: Nose normal.     Mouth/Throat:     Mouth: Mucous membranes are moist.  Eyes:     Extraocular Movements: Extraocular movements intact.     Pupils: Pupils are equal, round, and reactive to light.  Neck:     Comments: C-collar in place Cardiovascular:     Rate and Rhythm: Normal rate. Rhythm irregular.     Heart sounds: Normal heart sounds. No murmur heard.   Pulmonary:     Effort: Pulmonary effort is normal. No respiratory distress.     Breath sounds: Normal breath sounds. No wheezing.  Abdominal:     General: Bowel sounds are normal.     Palpations: Abdomen is soft.     Tenderness: There is no abdominal tenderness. There is no rebound.  Musculoskeletal:     Comments: No significant tenderness palpation, step-off, deformity of the mid line thoracic or lumbar spine, slight tenderness over the right posterior chest wall without crepitus or overlying skin changes, tenderness palpation right wrist without obvious deformity  Skin:    General: Skin is warm and dry.  Neurological:  Mental Status: He is alert and oriented to person, place, and time.     Comments: Intoxicated, moves all 4 extremities and follows simple commands  Psychiatric:     Comments: Intoxicated     ED Results / Procedures / Treatments   Labs (all labs ordered are listed, but only abnormal results are displayed) Labs Reviewed  CBC WITH DIFFERENTIAL/PLATELET - Abnormal; Notable for the following components:      Result Value   RBC 4.19 (*)    All other components within normal limits  COMPREHENSIVE METABOLIC PANEL - Abnormal; Notable for the following components:   Chloride 97 (*)    Glucose, Bld 116 (*)    AST 50 (*)    All other components within normal limits  PROTIME-INR - Abnormal; Notable for the following components:   Prothrombin Time 24.6 (*)     INR 2.3 (*)    All other components within normal limits  ETHANOL - Abnormal; Notable for the following components:   Alcohol, Ethyl (B) 244 (*)    All other components within normal limits  RESPIRATORY PANEL BY RT PCR (FLU A&B, COVID)  DIGOXIN LEVEL    EKG None  Radiology DG Wrist Complete Right  Result Date: 05/31/2020 CLINICAL DATA:  Initial evaluation for acute trauma, fall. EXAM: RIGHT WRIST - COMPLETE 3+ VIEW COMPARISON:  None. FINDINGS: No acute fracture dislocation. Mild osteoarthritic changes about the wrist. No visible soft tissue injury. Prominent vascular calcifications noted about the wrist. IMPRESSION: No acute osseous abnormality about the right wrist. Electronically Signed   By: Rise Mu M.D.   On: 05/31/2020 00:46   CT Head Wo Contrast  Result Date: 05/31/2020 CLINICAL DATA:  74 year old male with head trauma. EXAM: CT HEAD WITHOUT CONTRAST CT CERVICAL SPINE WITHOUT CONTRAST TECHNIQUE: Multidetector CT imaging of the head and cervical spine was performed following the standard protocol without intravenous contrast. Multiplanar CT image reconstructions of the cervical spine were also generated. COMPARISON:  None. FINDINGS: Evaluation of this exam is limited due to motion artifact. CT HEAD FINDINGS Brain: Mild age-related atrophy and chronic microvascular ischemic changes. There is no acute intracranial hemorrhage. No mass effect or midline shift no extra-axial fluid collection. Vascular: No hyperdense vessel or unexpected calcification. Skull: Normal. Negative for fracture or focal lesion. Sinuses/Orbits: There is diffuse mucoperiosteal thickening of paranasal sinuses. No air-fluid level. The mastoid air cells are clear. Other: None CT CERVICAL SPINE FINDINGS Alignment: No acute subluxation. Skull base and vertebrae: No acute fracture. Soft tissues and spinal canal: No prevertebral fluid or swelling. No visible canal hematoma. Disc levels:  Degenerative changes.  Upper chest: Negative. Other: Bilateral carotid bulb calcified plaques. IMPRESSION: 1. No acute intracranial pathology. 2. No acute/traumatic cervical spine pathology. Electronically Signed   By: Elgie Collard M.D.   On: 05/31/2020 01:17   CT Chest W Contrast  Result Date: 05/31/2020 CLINICAL DATA:  Abdominal trauma. Patient was found down in the parking lot. Low back pain. On Coumadin. Rib fractures on chest x-ray. EXAM: CT CHEST, ABDOMEN, AND PELVIS WITH CONTRAST TECHNIQUE: Multidetector CT imaging of the chest, abdomen and pelvis was performed following the standard protocol during bolus administration of intravenous contrast. CONTRAST:  OMNIPAQUE IOHEXOL 300 MG/ML  SOLN COMPARISON:  None. FINDINGS: CT CHEST FINDINGS Cardiovascular: Cardiac enlargement with particular prominence of the right and left atria. Coronary artery calcifications. No pericardial effusions. Normal caliber thoracic aorta. No aneurysm or dissection. Aortic calcification. Great vessel origins are patent. Mediastinum/Nodes: Esophagus is decompressed. No  significant lymphadenopathy. Lungs/Pleura: Small bilateral pleural effusions. Mild dependent changes in the lungs. Focal consolidation in the right mid lung consistent with contusion. No pneumothorax. Airways are patent. Musculoskeletal: Normal alignment of the thoracic spine. No vertebral compression deformities. Diffuse degenerative change. Sternum appears intact. Visualized shoulders and clavicles appear intact. Acute mildly depressed fractures of the right fifth, sixth, seventh, and eighth lateral ribs with associated pleural thickening and underlying pulmonary contusion. CT ABDOMEN PELVIS FINDINGS Hepatobiliary: Circumscribed low-attenuation lesion in segment 7 of the liver along the dome. Characterization is limited but this may represent a hemangioma. Appearance is not consistent with laceration. Consider follow-up with elective MRI. No obvious hepatic laceration or  hematoma. Gallbladder and bile ducts are unremarkable. Pancreas: Unremarkable. No pancreatic ductal dilatation or surrounding inflammatory changes. Spleen: No splenic injury or perisplenic hematoma. Adrenals/Urinary Tract: No adrenal hemorrhage or renal injury identified. Bladder is unremarkable. Stomach/Bowel: Stomach is within normal limits. Appendix appears normal. No evidence of bowel wall thickening, distention, or inflammatory changes. Vascular/Lymphatic: Aortic atherosclerosis. No enlarged abdominal or pelvic lymph nodes. Reproductive: Prostate is unremarkable. Other: No abdominal wall hernia or abnormality. No abdominopelvic ascites. Musculoskeletal: Normal alignment of the lumbar spine. Diffuse degenerative changes. No vertebral compression deformities. Schmorl's nodes are present. Sacrum, pelvis, and hips appear intact. IMPRESSION: 1. Acute mildly depressed fractures of the right fifth, sixth, seventh, and eighth lateral ribs with associated pleural thickening and underlying pulmonary contusion. No pneumothorax. 2. Small bilateral pleural effusions with dependent changes in the lungs. 3. Cardiac enlargement with particular prominence of the right and left atria. 4. Circumscribed low-attenuation lesion in segment 7 of the liver along the dome. Characterization is limited but this may represent a hemangioma. Consider follow-up with elective MRI. 5. No evidence of solid organ injury or bowel perforation. 6. Aortic atherosclerosis. Aortic Atherosclerosis (ICD10-I70.0). Electronically Signed   By: Burman Nieves M.D.   On: 05/31/2020 02:27   CT Cervical Spine Wo Contrast  Result Date: 05/31/2020 CLINICAL DATA:  74 year old male with head trauma. EXAM: CT HEAD WITHOUT CONTRAST CT CERVICAL SPINE WITHOUT CONTRAST TECHNIQUE: Multidetector CT imaging of the head and cervical spine was performed following the standard protocol without intravenous contrast. Multiplanar CT image reconstructions of the cervical  spine were also generated. COMPARISON:  None. FINDINGS: Evaluation of this exam is limited due to motion artifact. CT HEAD FINDINGS Brain: Mild age-related atrophy and chronic microvascular ischemic changes. There is no acute intracranial hemorrhage. No mass effect or midline shift no extra-axial fluid collection. Vascular: No hyperdense vessel or unexpected calcification. Skull: Normal. Negative for fracture or focal lesion. Sinuses/Orbits: There is diffuse mucoperiosteal thickening of paranasal sinuses. No air-fluid level. The mastoid air cells are clear. Other: None CT CERVICAL SPINE FINDINGS Alignment: No acute subluxation. Skull base and vertebrae: No acute fracture. Soft tissues and spinal canal: No prevertebral fluid or swelling. No visible canal hematoma. Disc levels:  Degenerative changes. Upper chest: Negative. Other: Bilateral carotid bulb calcified plaques. IMPRESSION: 1. No acute intracranial pathology. 2. No acute/traumatic cervical spine pathology. Electronically Signed   By: Elgie Collard M.D.   On: 05/31/2020 01:17   CT ABDOMEN PELVIS W CONTRAST  Result Date: 05/31/2020 CLINICAL DATA:  Abdominal trauma. Patient was found down in the parking lot. Low back pain. On Coumadin. Rib fractures on chest x-ray. EXAM: CT CHEST, ABDOMEN, AND PELVIS WITH CONTRAST TECHNIQUE: Multidetector CT imaging of the chest, abdomen and pelvis was performed following the standard protocol during bolus administration of intravenous contrast. CONTRAST:  OMNIPAQUE IOHEXOL  300 MG/ML  SOLN COMPARISON:  None. FINDINGS: CT CHEST FINDINGS Cardiovascular: Cardiac enlargement with particular prominence of the right and left atria. Coronary artery calcifications. No pericardial effusions. Normal caliber thoracic aorta. No aneurysm or dissection. Aortic calcification. Great vessel origins are patent. Mediastinum/Nodes: Esophagus is decompressed. No significant lymphadenopathy. Lungs/Pleura: Small bilateral pleural  effusions. Mild dependent changes in the lungs. Focal consolidation in the right mid lung consistent with contusion. No pneumothorax. Airways are patent. Musculoskeletal: Normal alignment of the thoracic spine. No vertebral compression deformities. Diffuse degenerative change. Sternum appears intact. Visualized shoulders and clavicles appear intact. Acute mildly depressed fractures of the right fifth, sixth, seventh, and eighth lateral ribs with associated pleural thickening and underlying pulmonary contusion. CT ABDOMEN PELVIS FINDINGS Hepatobiliary: Circumscribed low-attenuation lesion in segment 7 of the liver along the dome. Characterization is limited but this may represent a hemangioma. Appearance is not consistent with laceration. Consider follow-up with elective MRI. No obvious hepatic laceration or hematoma. Gallbladder and bile ducts are unremarkable. Pancreas: Unremarkable. No pancreatic ductal dilatation or surrounding inflammatory changes. Spleen: No splenic injury or perisplenic hematoma. Adrenals/Urinary Tract: No adrenal hemorrhage or renal injury identified. Bladder is unremarkable. Stomach/Bowel: Stomach is within normal limits. Appendix appears normal. No evidence of bowel wall thickening, distention, or inflammatory changes. Vascular/Lymphatic: Aortic atherosclerosis. No enlarged abdominal or pelvic lymph nodes. Reproductive: Prostate is unremarkable. Other: No abdominal wall hernia or abnormality. No abdominopelvic ascites. Musculoskeletal: Normal alignment of the lumbar spine. Diffuse degenerative changes. No vertebral compression deformities. Schmorl's nodes are present. Sacrum, pelvis, and hips appear intact. IMPRESSION: 1. Acute mildly depressed fractures of the right fifth, sixth, seventh, and eighth lateral ribs with associated pleural thickening and underlying pulmonary contusion. No pneumothorax. 2. Small bilateral pleural effusions with dependent changes in the lungs. 3. Cardiac  enlargement with particular prominence of the right and left atria. 4. Circumscribed low-attenuation lesion in segment 7 of the liver along the dome. Characterization is limited but this may represent a hemangioma. Consider follow-up with elective MRI. 5. No evidence of solid organ injury or bowel perforation. 6. Aortic atherosclerosis. Aortic Atherosclerosis (ICD10-I70.0). Electronically Signed   By: Burman Nieves M.D.   On: 05/31/2020 02:27   DG Chest Portable 1 View  Result Date: 05/31/2020 CLINICAL DATA:  Initial evaluation for acute trauma, fall. EXAM: PORTABLE CHEST 1 VIEW COMPARISON:  None. FINDINGS: Prominent cardiomegaly. Mediastinal silhouette within normal limits. Aortic atherosclerosis. Lungs are hypoinflated. No focal infiltrates. No pulmonary edema or visible pleural effusion. No visible pneumothorax. There are acute minimally displaced fractures of the right lateral fifth through seventh ribs. No other visible acute osseous abnormality. IMPRESSION: 1. Acute minimally displaced fractures of the right lateral fifth through seventh ribs. No visible pneumothorax. 2. Cardiomegaly without pulmonary edema. 3.  Aortic Atherosclerosis (ICD10-I70.0). Electronically Signed   By: Rise Mu M.D.   On: 05/31/2020 00:49    Procedures Procedures (including critical care time)  CRITICAL CARE Performed by: Shon Baton   Total critical care time: 35 minutes  Critical care time was exclusive of separately billable procedures and treating other patients.  Critical care was necessary to treat or prevent imminent or life-threatening deterioration.  Critical care was time spent personally by me on the following activities: development of treatment plan with patient and/or surrogate as well as nursing, discussions with consultants, evaluation of patient's response to treatment, examination of patient, obtaining history from patient or surrogate, ordering and performing treatments and  interventions, ordering and review of laboratory studies, ordering and  review of radiographic studies, pulse oximetry and re-evaluation of patient's condition.   Medications Ordered in ED Medications  LORazepam (ATIVAN) injection 0-4 mg (has no administration in time range)    Or  LORazepam (ATIVAN) tablet 0-4 mg (has no administration in time range)  LORazepam (ATIVAN) injection 0-4 mg (has no administration in time range)    Or  LORazepam (ATIVAN) tablet 0-4 mg (has no administration in time range)  thiamine tablet 100 mg (has no administration in time range)    Or  thiamine (B-1) injection 100 mg (has no administration in time range)  iohexol (OMNIPAQUE) 300 MG/ML solution 100 mL (100 mLs Intravenous Contrast Given 05/31/20 0150)    ED Course  I have reviewed the triage vital signs and the nursing notes.  Pertinent labs & imaging results that were available during my care of the patient were reviewed by me and considered in my medical decision making (see chart for details).  Clinical Course as of May 31 325  Tue May 31, 2020  [id number] Discussed the patient with Dr. Dwain SarnaWakefield.  Trauma surgery will plan to admit.  I have added CIWA protocol to his order set.   [CH]    Clinical Course User Index [CH] Jotham Ahn, Mayer Maskerourtney F, MD   MDM Rules/Calculators/A&P                           Patient presents following a fall.  He is obviously intoxicated but nontoxic and ABCs are intact.  Vital signs reviewed and largely reassuring.  He is in atrial fibrillation.  I have reviewed his past chart.  This is his main past medical history with the exception of chronic drinking which his sister-in-law states is "an ongoing problem."  Will obtain x-rays and CT scan of the head and neck.  CT scan does not show any intracranial injury or bleed.  CT of the neck is negative for acute fracture; however, and unable to clear his C-spine as he is intoxicated.  He has had some repetitive questioning and question  concussion; however, again this is complicated by intoxication.  Blood alcohol level is 244.  Lab work reviewed.  Hemoglobin is stable at 13.4.  INR 2.3.  I have added a digoxin level which is pending.  Covid testing is negative.  Chest x-ray was concerning for 3 rib fractures.  Given his anticoagulant use, will obtain a CT scan to further delineate and ensure no underlying bleed or contusion.  CT scan confirms for right-sided rib fractures and underlying pulmonary contusions.  O2 sats have remained in the low 90s.  Patient and his sister-in-law were updated the bedside.  He will need admission for pulmonary toilet and pain control.  See discussion above.    Final Clinical Impression(s) / ED Diagnoses Final diagnoses:  Fall, initial encounter  Closed fracture of multiple ribs of right side, initial encounter  Contusion of right lung, initial encounter    Rx / DC Orders ED Discharge Orders    None       Strother Everitt, Mayer Maskerourtney F, MD 05/31/20 [phone]

## 2020-05-31 NOTE — ED Triage Notes (Signed)
Pt transported by [facility] from his apartment complex parking, bystanders found pt down unkn amount of time, pt is on Coumadin, abrasion R forehead, R hand and r low back pain. Heavy etoh. ccollar in place.

## 2020-05-31 NOTE — Progress Notes (Signed)
Pt. Arrived to the floor. Pt. was assessed no distress was noted. VSS. Pt, did express that his drinking has gotten out of control. Pt. Stated he has battled alcoholism since 2007 and has been in two different in-patient rehabs and AA.

## 2020-05-31 NOTE — Discharge Summary (Signed)
Physician Discharge Summary  Patient ID: Cameron Patton MRN: [id number] DOB/AGE: October 23, 1945 74 y.o.  Admit date: 05/31/2020 Discharge date: 05/31/2020  Discharge Diagnoses Patient Active Problem List   Diagnosis Date Noted  . Multiple fractures of ribs, right side, initial encounter for closed fracture 05/31/2020    Consultants None  Procedures None   HPI/Hospital course: Patient is a 74 year old male who presented to [facility] after being found down. Unable to recall events leading up to this. Stated he was told he fell and reported drinking alcohol. Complained of pain in lower chest and headache. Found to have right sided rib fractures and alcohol intoxication. PMH significant for A.fib on coumadin. Patient was admitted to the trauma service for pain control and observation. C-spine cleared once patient had sobered up. Pain well controlled and felt stable for discharge home with follow up with PCP.    Allergies as of 05/31/2020   No Known Allergies     Medication List    TAKE these medications   acetaminophen 500 MG tablet Commonly known as: TYLENOL Take 2 tablets (1,000 mg total) by mouth every 8 (eight) hours as needed for mild pain or fever.   digoxin 0.125 MG tablet Commonly known as: LANOXIN Take 125 mcg by mouth daily.   methocarbamol 500 MG tablet Commonly known as: ROBAXIN Take 1 tablet (500 mg total) by mouth 3 (three) times daily.   oxyCODONE 5 MG immediate release tablet Commonly known as: Oxy IR/ROXICODONE Take 1 tablet (5 mg total) by mouth every 6 (six) hours as needed for moderate pain or severe pain.   warfarin 3 MG tablet Commonly known as: COUMADIN Take 1.5-3 mg by mouth See admin instructions. Taking 1.5mg  (1/2 tab)  on Sun , Tues, Thur. All other days 3 mg tab daily         Follow-up Information    Ardith Dark, MD. Call.   Specialty: Family Medicine Why: Call and schedule a follow up appointment for rib fractures in 1-2 weeks.  Contact  information: [id number] [place] [address] [phone]               Signed: [name] , [facility] 05/31/2020, 2:34 PM Please see Amion for pager number during day hours 7:00am-4:30pm

## 2020-05-31 NOTE — Care Management Obs Status (Signed)
MEDICARE OBSERVATION STATUS NOTIFICATION   Patient Details  Name: Cameron Patton MRN: [id number] Date of Birth: 1946-01-11   Medicare Observation Status Notification Given:  Yes    Glennon Mac, RN 05/31/2020, 3:26 PM

## 2020-05-31 NOTE — ED Notes (Signed)
Pt to CT via stretcher

## 2020-05-31 NOTE — ED Notes (Signed)
Pts sister in law at bedside, updated by Dr. Wilkie Aye

## 2020-05-31 NOTE — Evaluation (Signed)
Physical Therapy Evaluation Patient Details Name: Cameron Patton MRN: [id number] DOB: 04-23-1946 Today's Date: 05/31/2020   History of Present Illness  74yo male found down after a fall after drinking. Found to have acute fractures in ribs 5-8 on the right, otherwise no acute fractures or injury. PMH A-fib, EtOH abuse  Clinical Impression   Patient received in bed, very pleasant and cooperative with PT but also very limited by pain today. Really only able to tolerate rolling in bed due to ongoing R sided rib pain even being premedicated. Did attempt supine to sit but unable due to pain. He reports really struggling with his alcohol consumption, which is what appears to have lead to this recent fall. Strength in BLEs 4+/5 grossly. Left in bed with all needs met, bed alarm active. Evaluation very limited today, however given history of this major fall as well as difficulty with mobility today, may benefit from SNF however will update reccs if he progresses to the point he could safely go home.     Follow Up Recommendations SNF;Other (comment) ([facility] HHPT pending progress)    Equipment Recommendations  Rolling walker with 5" wheels    Recommendations for Other Services       Precautions / Restrictions Precautions Precautions: Fall;Other (comment) Precaution Comments: R sided rib fractures Restrictions Weight Bearing Restrictions: No      Mobility  Bed Mobility Overal bed mobility: Needs Assistance Bed Mobility: Rolling;Supine to Sit Rolling: Modified independent (Device/Increase time)         General bed mobility comments: extended time and effort for rolling due to pain, initially attempted supine to sit then refused due to pain  Transfers                 General transfer comment: refused- pain  Ambulation/Gait             General Gait Details: refused- pain  Stairs            Wheelchair Mobility    Modified Rankin (Stroke Patients Only)        Balance Overall balance assessment: History of Falls                                           Pertinent Vitals/Pain Pain Assessment: Faces Faces Pain Scale: Hurts whole lot Pain Location: R rib fractures with movement Pain Descriptors / Indicators: Aching;Sharp;Sore Pain Intervention(s): Limited activity within patient's tolerance;Monitored during session;Premedicated before session    Home Living Family/patient expects to be discharged to:: Private residence (independent living facility) Living Arrangements: Alone Available Help at Discharge: Family;Other (Comment) (sister in law is in town, could help if needed) Type of Home: Apartment Home Access: Level entry (first floor)     Home Layout: One level Home Equipment: None Additional Comments: tries to go to [facility] when he can, 30 minutes 3x/week    Prior Function Level of Independence: Independent               Hand Dominance        Extremity/Trunk Assessment   Upper Extremity Assessment Upper Extremity Assessment: Overall WFL for tasks assessed    Lower Extremity Assessment Lower Extremity Assessment: Overall WFL for tasks assessed    Cervical / Trunk Assessment Cervical / Trunk Assessment: Normal (still in C-collar at eval)  Communication   Communication: No difficulties  Cognition Arousal/Alertness: Awake/alert Behavior During Therapy:  Flat affect Overall Cognitive Status: Within Functional Limits for tasks assessed                                 General Comments: very polite and cooperative, just very pain limited today      General Comments General comments (skin integrity, edema, etc.): refused EOB/OOB due to rib pain today (even being pre-medicated), unable to assess balance    Exercises     Assessment/Plan    PT Assessment Patient needs continued PT services  PT Problem List Decreased activity tolerance;Decreased safety awareness;Decreased  balance;Decreased mobility;Decreased coordination       PT Treatment Interventions DME instruction;Balance training;Gait training;Stair training;Functional mobility training;Patient/family education;Therapeutic activities;Therapeutic exercise    PT Goals (Current goals can be found in the Care Plan section)  Acute Rehab PT Goals Patient Stated Goal: less pain PT Goal Formulation: With patient Time For Goal Achievement: 06/14/20 Potential to Achieve Goals: Fair    Frequency Min 3X/week   Barriers to discharge Decreased caregiver support      Co-evaluation               AM-PAC PT "6 Clicks" Mobility  Outcome Measure Help needed turning from your back to your side while in a flat bed without using bedrails?: A Little Help needed moving from lying on your back to sitting on the side of a flat bed without using bedrails?: A Lot Help needed moving to and from a bed to a chair (including a wheelchair)?: A Little Help needed standing up from a chair using your arms (e.g., wheelchair or bedside chair)?: A Little Help needed to walk in hospital room?: A Little Help needed climbing 3-5 steps with a railing? : A Lot 6 Click Score: 16    End of Session Equipment Utilized During Treatment: Other (comment) (still in c-collar at eval) Activity Tolerance: Patient limited by pain Patient left: in bed;with call bell/phone within reach;with bed alarm set Nurse Communication: Mobility status PT Visit Diagnosis: History of falling (Z91.81);Unsteadiness on feet (R26.81);Difficulty in walking, not elsewhere classified (R26.2)    Time: 6701-4103 PT Time Calculation (min) (ACUTE ONLY): 20 min   Charges:   PT Evaluation $PT Eval Low Complexity: 1 Low          Windell Norfolk, DPT, PN1   Supplemental Physical Therapist [facility]    Pager [phone] Acute Rehab Office [phone]

## 2020-05-31 NOTE — H&P (Signed)
MICHALE EMMERICH is an 74 y.o. male.   Chief Complaint: rib fx HPI: 74yom with history afib on coumadin was found down. He cannot really tell me what happened. He does state he was told he fell and does state he was drinking. Complaining of some pain in lower chest and a headache. He underwent evaluation and was found to have rib fx and to be intoxicated.   Pmh: afib meds coumadin, digoxin + etoh nkda psh unknown  He is not really able to completely participate in this encounter due to intoxication  Results for orders placed or performed during the hospital encounter of 05/31/20 (from the past 48 hour(s))  CBC with Differential     Status: Abnormal   Collection Time: 05/31/20 12:22 AM  Result Value Ref Range   WBC 7.4 4.0 - 10.5 K/uL   RBC 4.19 (L) 4.22 - 5.81 MIL/uL   Hemoglobin 13.4 13.0 - 17.0 g/dL   HCT 09.2 39 - 52 %   MCV 97.9 80.0 - 100.0 fL   MCH 32.0 26.0 - 34.0 pg   MCHC 32.7 30.0 - 36.0 g/dL   RDW 33.0 07.6 - 22.6 %   Platelets 265 150 - 400 K/uL   nRBC 0.0 0.0 - 0.2 %   Neutrophils Relative % 52 %   Neutro Abs 3.9 1.7 - 7.7 K/uL   Lymphocytes Relative 37 %   Lymphs Abs 2.7 0.7 - 4.0 K/uL   Monocytes Relative 8 %   Monocytes Absolute 0.6 0.1 - 1.0 K/uL   Eosinophils Relative 2 %   Eosinophils Absolute 0.1 0.0 - 0.5 K/uL   Basophils Relative 0 %   Basophils Absolute 0.0 0.0 - 0.1 K/uL   Immature Granulocytes 1 %   Abs Immature Granulocytes 0.05 0.00 - 0.07 K/uL    Comment: Performed at [facility], [address] [place], [place] [address]  Comprehensive metabolic panel     Status: Abnormal   Collection Time: 05/31/20 12:22 AM  Result Value Ref Range   Sodium 136 135 - 145 mmol/L   Potassium 4.1 3.5 - 5.1 mmol/L   Chloride 97 (L) 98 - 111 mmol/L   CO2 26 22 - 32 mmol/L   Glucose, Bld 116 (H) 70 - 99 mg/dL    Comment: Glucose reference range applies only to samples taken after fasting for at least 8 hours.   BUN 9 8 - 23 mg/dL   Creatinine, Ser 5.62 0.61  - 1.24 mg/dL   Calcium 9.4 8.9 - 56.3 mg/dL   Total Protein 7.4 6.5 - 8.1 g/dL   Albumin 4.0 3.5 - 5.0 g/dL   AST 50 (H) 15 - 41 U/L   ALT 30 0 - 44 U/L   Alkaline Phosphatase 69 38 - 126 U/L   Total Bilirubin 0.8 0.3 - 1.2 mg/dL   GFR, Estimated >89 >37 mL/min   Anion gap 13 5 - 15    Comment: Performed at [facility], [address], [place], [place] [address]  Protime-INR     Status: Abnormal   Collection Time: 05/31/20 12:22 AM  Result Value Ref Range   Prothrombin Time 24.6 (H) 11.4 - 15.2 seconds   INR 2.3 (H) 0.8 - 1.2    Comment: (NOTE) INR goal varies based on device and disease states. Performed at [facility], [address] [place], [place] [address]   Ethanol     Status: Abnormal   Collection Time: 05/31/20 12:23  AM  Result Value Ref Range   Alcohol, Ethyl (B) 244 (H) <10 mg/dL    Comment: (NOTE) Lowest detectable limit for serum alcohol is 10 mg/dL.  For medical purposes only. Performed at [facility], [address], [place], [place] [address]   Respiratory Panel by RT PCR (Flu A&B, Covid) - Nasopharyngeal Swab     Status: None   Collection Time: 05/31/20  2:04 AM   Specimen: Nasopharyngeal Swab  Result Value Ref Range   SARS Coronavirus 2 by RT PCR NEGATIVE NEGATIVE    Comment: (NOTE) SARS-CoV-2 target nucleic acids are NOT DETECTED.  The SARS-CoV-2 RNA is generally detectable in upper respiratoy specimens during the acute phase of infection. The lowest concentration of SARS-CoV-2 viral copies this assay can detect is 131 copies/mL. A negative result does not preclude SARS-Cov-2 infection and should not be used as the sole basis for treatment or other patient management decisions. A negative result may occur with  improper specimen collection/handling, submission of specimen other than nasopharyngeal swab, presence of viral mutation(s) within the areas targeted by this assay, and inadequate number of viral copies (<131 copies/mL).  A negative result must be combined with clinical observations, patient history, and epidemiological information. The expected result is Negative.  Fact Sheet for Patients:  https://www.moore.com/  Fact Sheet for Healthcare Providers:  https://www.young.biz/  This test is no t yet approved or cleared by the [place] FDA and  has been authorized for detection and/or diagnosis of SARS-CoV-2 by FDA under an Emergency Use Authorization (EUA). This EUA will remain  in effect (meaning this test can be used) for the duration of the COVID-19 declaration under Section 564(b)(1) of the Act, 21 U.S.C. section 360bbb-3(b)(1), unless the authorization is terminated or revoked sooner.     Influenza A by PCR NEGATIVE NEGATIVE   Influenza B by PCR NEGATIVE NEGATIVE    Comment: (NOTE) The Xpert Xpress SARS-CoV-2/FLU/RSV assay is intended as an aid in  the diagnosis of influenza from Nasopharyngeal swab specimens and  should not be used as a sole basis for treatment. Nasal washings and  aspirates are unacceptable for Xpert Xpress SARS-CoV-2/FLU/RSV  testing.  Fact Sheet for Patients: https://www.moore.com/  Fact Sheet for Healthcare Providers: https://www.young.biz/  This test is not yet approved or cleared by the [place] FDA and  has been authorized for detection and/or diagnosis of SARS-CoV-2 by  FDA under an Emergency Use Authorization (EUA). This EUA will remain  in effect (meaning this test can be used) for the duration of the  Covid-19 declaration under Section 564(b)(1) of the Act, 21  U.S.C. section 360bbb-3(b)(1), unless the authorization is  terminated or revoked. Performed at [facility], [address] [place], [place] [address]    DG Wrist Complete Right  Result Date: 05/31/2020 CLINICAL DATA:  Initial evaluation for acute trauma, fall. EXAM: RIGHT WRIST - COMPLETE 3+ VIEW  COMPARISON:  None. FINDINGS: No acute fracture dislocation. Mild osteoarthritic changes about the wrist. No visible soft tissue injury. Prominent vascular calcifications noted about the wrist. IMPRESSION: No acute osseous abnormality about the right wrist. Electronically Signed   By: Rise Mu M.D.   On: 05/31/2020 00:46   CT Head Wo Contrast  Result Date: 05/31/2020 CLINICAL DATA:  74 year old male with head trauma. EXAM: CT HEAD WITHOUT CONTRAST CT CERVICAL SPINE WITHOUT CONTRAST TECHNIQUE: Multidetector CT imaging of the head and cervical spine was performed following the standard protocol without intravenous contrast. Multiplanar CT image reconstructions  of the cervical spine were also generated. COMPARISON:  None. FINDINGS: Evaluation of this exam is limited due to motion artifact. CT HEAD FINDINGS Brain: Mild age-related atrophy and chronic microvascular ischemic changes. There is no acute intracranial hemorrhage. No mass effect or midline shift no extra-axial fluid collection. Vascular: No hyperdense vessel or unexpected calcification. Skull: Normal. Negative for fracture or focal lesion. Sinuses/Orbits: There is diffuse mucoperiosteal thickening of paranasal sinuses. No air-fluid level. The mastoid air cells are clear. Other: None CT CERVICAL SPINE FINDINGS Alignment: No acute subluxation. Skull base and vertebrae: No acute fracture. Soft tissues and spinal canal: No prevertebral fluid or swelling. No visible canal hematoma. Disc levels:  Degenerative changes. Upper chest: Negative. Other: Bilateral carotid bulb calcified plaques. IMPRESSION: 1. No acute intracranial pathology. 2. No acute/traumatic cervical spine pathology. Electronically Signed   By: Elgie CollardArash  Radparvar M.D.   On: 05/31/2020 01:17   CT Chest W Contrast  Result Date: 05/31/2020 CLINICAL DATA:  Abdominal trauma. Patient was found down in the parking lot. Low back pain. On Coumadin. Rib fractures on chest x-ray. EXAM: CT  CHEST, ABDOMEN, AND PELVIS WITH CONTRAST TECHNIQUE: Multidetector CT imaging of the chest, abdomen and pelvis was performed following the standard protocol during bolus administration of intravenous contrast. CONTRAST:  100mL OMNIPAQUE IOHEXOL 300 MG/ML  SOLN COMPARISON:  None. FINDINGS: CT CHEST FINDINGS Cardiovascular: Cardiac enlargement with particular prominence of the right and left atria. Coronary artery calcifications. No pericardial effusions. Normal caliber thoracic aorta. No aneurysm or dissection. Aortic calcification. Great vessel origins are patent. Mediastinum/Nodes: Esophagus is decompressed. No significant lymphadenopathy. Lungs/Pleura: Small bilateral pleural effusions. Mild dependent changes in the lungs. Focal consolidation in the right mid lung consistent with contusion. No pneumothorax. Airways are patent. Musculoskeletal: Normal alignment of the thoracic spine. No vertebral compression deformities. Diffuse degenerative change. Sternum appears intact. Visualized shoulders and clavicles appear intact. Acute mildly depressed fractures of the right fifth, sixth, seventh, and eighth lateral ribs with associated pleural thickening and underlying pulmonary contusion. CT ABDOMEN PELVIS FINDINGS Hepatobiliary: Circumscribed low-attenuation lesion in segment 7 of the liver along the dome. Characterization is limited but this may represent a hemangioma. Appearance is not consistent with laceration. Consider follow-up with elective MRI. No obvious hepatic laceration or hematoma. Gallbladder and bile ducts are unremarkable. Pancreas: Unremarkable. No pancreatic ductal dilatation or surrounding inflammatory changes. Spleen: No splenic injury or perisplenic hematoma. Adrenals/Urinary Tract: No adrenal hemorrhage or renal injury identified. Bladder is unremarkable. Stomach/Bowel: Stomach is within normal limits. Appendix appears normal. No evidence of bowel wall thickening, distention, or inflammatory  changes. Vascular/Lymphatic: Aortic atherosclerosis. No enlarged abdominal or pelvic lymph nodes. Reproductive: Prostate is unremarkable. Other: No abdominal wall hernia or abnormality. No abdominopelvic ascites. Musculoskeletal: Normal alignment of the lumbar spine. Diffuse degenerative changes. No vertebral compression deformities. Schmorl's nodes are present. Sacrum, pelvis, and hips appear intact. IMPRESSION: 1. Acute mildly depressed fractures of the right fifth, sixth, seventh, and eighth lateral ribs with associated pleural thickening and underlying pulmonary contusion. No pneumothorax. 2. Small bilateral pleural effusions with dependent changes in the lungs. 3. Cardiac enlargement with particular prominence of the right and left atria. 4. Circumscribed low-attenuation lesion in segment 7 of the liver along the dome. Characterization is limited but this may represent a hemangioma. Consider follow-up with elective MRI. 5. No evidence of solid organ injury or bowel perforation. 6. Aortic atherosclerosis. Aortic Atherosclerosis (ICD10-I70.0). Electronically Signed   By: Burman NievesWilliam  Stevens M.D.   On: 05/31/2020 02:27   CT Cervical  Spine Wo Contrast  Result Date: 05/31/2020 CLINICAL DATA:  74 year old male with head trauma. EXAM: CT HEAD WITHOUT CONTRAST CT CERVICAL SPINE WITHOUT CONTRAST TECHNIQUE: Multidetector CT imaging of the head and cervical spine was performed following the standard protocol without intravenous contrast. Multiplanar CT image reconstructions of the cervical spine were also generated. COMPARISON:  None. FINDINGS: Evaluation of this exam is limited due to motion artifact. CT HEAD FINDINGS Brain: Mild age-related atrophy and chronic microvascular ischemic changes. There is no acute intracranial hemorrhage. No mass effect or midline shift no extra-axial fluid collection. Vascular: No hyperdense vessel or unexpected calcification. Skull: Normal. Negative for fracture or focal lesion.  Sinuses/Orbits: There is diffuse mucoperiosteal thickening of paranasal sinuses. No air-fluid level. The mastoid air cells are clear. Other: None CT CERVICAL SPINE FINDINGS Alignment: No acute subluxation. Skull base and vertebrae: No acute fracture. Soft tissues and spinal canal: No prevertebral fluid or swelling. No visible canal hematoma. Disc levels:  Degenerative changes. Upper chest: Negative. Other: Bilateral carotid bulb calcified plaques. IMPRESSION: 1. No acute intracranial pathology. 2. No acute/traumatic cervical spine pathology. Electronically Signed   By: Elgie Collard M.D.   On: 05/31/2020 01:17   CT ABDOMEN PELVIS W CONTRAST  Result Date: 05/31/2020 CLINICAL DATA:  Abdominal trauma. Patient was found down in the parking lot. Low back pain. On Coumadin. Rib fractures on chest x-ray. EXAM: CT CHEST, ABDOMEN, AND PELVIS WITH CONTRAST TECHNIQUE: Multidetector CT imaging of the chest, abdomen and pelvis was performed following the standard protocol during bolus administration of intravenous contrast. CONTRAST:  OMNIPAQUE IOHEXOL 300 MG/ML  SOLN COMPARISON:  None. FINDINGS: CT CHEST FINDINGS Cardiovascular: Cardiac enlargement with particular prominence of the right and left atria. Coronary artery calcifications. No pericardial effusions. Normal caliber thoracic aorta. No aneurysm or dissection. Aortic calcification. Great vessel origins are patent. Mediastinum/Nodes: Esophagus is decompressed. No significant lymphadenopathy. Lungs/Pleura: Small bilateral pleural effusions. Mild dependent changes in the lungs. Focal consolidation in the right mid lung consistent with contusion. No pneumothorax. Airways are patent. Musculoskeletal: Normal alignment of the thoracic spine. No vertebral compression deformities. Diffuse degenerative change. Sternum appears intact. Visualized shoulders and clavicles appear intact. Acute mildly depressed fractures of the right fifth, sixth, seventh, and eighth  lateral ribs with associated pleural thickening and underlying pulmonary contusion. CT ABDOMEN PELVIS FINDINGS Hepatobiliary: Circumscribed low-attenuation lesion in segment 7 of the liver along the dome. Characterization is limited but this may represent a hemangioma. Appearance is not consistent with laceration. Consider follow-up with elective MRI. No obvious hepatic laceration or hematoma. Gallbladder and bile ducts are unremarkable. Pancreas: Unremarkable. No pancreatic ductal dilatation or surrounding inflammatory changes. Spleen: No splenic injury or perisplenic hematoma. Adrenals/Urinary Tract: No adrenal hemorrhage or renal injury identified. Bladder is unremarkable. Stomach/Bowel: Stomach is within normal limits. Appendix appears normal. No evidence of bowel wall thickening, distention, or inflammatory changes. Vascular/Lymphatic: Aortic atherosclerosis. No enlarged abdominal or pelvic lymph nodes. Reproductive: Prostate is unremarkable. Other: No abdominal wall hernia or abnormality. No abdominopelvic ascites. Musculoskeletal: Normal alignment of the lumbar spine. Diffuse degenerative changes. No vertebral compression deformities. Schmorl's nodes are present. Sacrum, pelvis, and hips appear intact. IMPRESSION: 1. Acute mildly depressed fractures of the right fifth, sixth, seventh, and eighth lateral ribs with associated pleural thickening and underlying pulmonary contusion. No pneumothorax. 2. Small bilateral pleural effusions with dependent changes in the lungs. 3. Cardiac enlargement with particular prominence of the right and left atria. 4. Circumscribed low-attenuation lesion in segment 7 of the liver along the  dome. Characterization is limited but this may represent a hemangioma. Consider follow-up with elective MRI. 5. No evidence of solid organ injury or bowel perforation. 6. Aortic atherosclerosis. Aortic Atherosclerosis (ICD10-I70.0). Electronically Signed   By: Burman Nieves M.D.   On:  05/31/2020 02:27   DG Chest Portable 1 View  Result Date: 05/31/2020 CLINICAL DATA:  Initial evaluation for acute trauma, fall. EXAM: PORTABLE CHEST 1 VIEW COMPARISON:  None. FINDINGS: Prominent cardiomegaly. Mediastinal silhouette within normal limits. Aortic atherosclerosis. Lungs are hypoinflated. No focal infiltrates. No pulmonary edema or visible pleural effusion. No visible pneumothorax. There are acute minimally displaced fractures of the right lateral fifth through seventh ribs. No other visible acute osseous abnormality. IMPRESSION: 1. Acute minimally displaced fractures of the right lateral fifth through seventh ribs. No visible pneumothorax. 2. Cardiomegaly without pulmonary edema. 3.  Aortic Atherosclerosis (ICD10-I70.0). Electronically Signed   By: Rise Mu M.D.   On: 05/31/2020 00:49    Review of Systems  Cardiovascular: Positive for chest pain.  Gastrointestinal: Negative for abdominal pain.  All other systems reviewed and are negative.   Blood pressure 122/75, pulse 86, temperature (!) 97 F (36.1 C), temperature source Tympanic, resp. rate 13, height  (1.778 m), weight 77 kg, SpO2 97 %. Physical Exam Constitutional:      General: He is not in acute distress.    Appearance: Normal appearance.  HENT:     Head: Normocephalic.     Right Ear: External ear normal.     Left Ear: External ear normal.     Nose: Nose normal.     Mouth/Throat:     Mouth: Mucous membranes are moist.     Pharynx: Oropharynx is clear.  Eyes:     General: No scleral icterus.    Extraocular Movements: Extraocular movements intact.     Pupils: Pupils are equal, round, and reactive to light.  Neck:     Comments: c collar in place Cardiovascular:     Rate and Rhythm: Normal rate.     Pulses: Normal pulses.  Pulmonary:     Effort: Pulmonary effort is normal.     Breath sounds: Normal breath sounds.  Abdominal:     General: Bowel sounds are normal.     Palpations: Abdomen is  soft.     Tenderness: There is no abdominal tenderness.  Musculoskeletal:        General: No tenderness.     Right lower leg: No edema.     Left lower leg: No edema.  Lymphadenopathy:     Cervical: No cervical adenopathy.  Skin:    General: Skin is warm and dry.     Capillary Refill: Capillary refill takes less than 2 seconds.  Neurological:     General: No focal deficit present.     Mental Status: He is alert.  Psychiatric:        Attention and Perception: He is inattentive.      Assessment/Plan Fall Right  5-8 rib fx with underlying contusion-admission for pain control, ics, pulm toilet, repeat xray in am Afib- INR 2.3, will hold coumadin for now, start digoxin Etoh abuse- ciwa protocol, toc consult Lovenox, scds  Emelia Loron, MD 05/31/2020, 5:37 AM

## 2020-05-31 NOTE — TOC Transition Note (Signed)
Transition of Care [facility]) - CM/SW Discharge Note   Patient Details  Name: Cameron Patton MRN: [id number] Date of Birth: 1945-12-03  Transition of Care [facility]) CM/SW Contact:  Ella Bodo, RN Phone Number: 05/31/2020, 3:29 PM   Clinical Narrative:  74yo male found down after a fall after drinking. Found to have acute fractures in ribs 5-8 on the right, otherwise no acute fractures or injury.  PTA, pt independent, lives at home alone.  He states his sister in law will pick him up at dc, and he may go to her house for a while to recover.  PT recommending SNF vs. HH, but patient politely declined need for [facility] services. CSW has met with pt to offer ETOH counseling. He denies any other needs for home.       Final next level of care: Home/Self Care Barriers to Discharge: Barriers Resolved            Discharge Plan and Services   Discharge Planning Services: CM Consult                      HH Arranged: Patient Refused [facility]          Social Determinants of Health (SDOH) Interventions     Readmission Risk Interventions No flowsheet data found.  Reinaldo Raddle, RN, BSN  Trauma/Neuro ICU Case Manager [phone]

## 2020-05-31 NOTE — ED Notes (Signed)
Pt received into 38 for holding

## 2020-05-31 NOTE — Care Management CC44 (Signed)
Condition Code 44 Documentation Completed  Patient Details  Name: Cameron Patton MRN: [id number] Date of Birth: 1945-09-14   Condition Code 44 given:  Yes Patient signature on Condition Code 44 notice:  Yes Documentation of 2 MD's agreement:  Yes Code 44 added to claim:  Yes    Glennon Mac, RN 05/31/2020, 3:26 PM

## 2020-05-31 NOTE — Discharge Instructions (Signed)
Rib Fracture  A rib fracture is a break or crack in one of the bones of the ribs. The ribs are like a cage that goes around your upper chest. A broken or cracked rib is often painful, but most do not cause other problems. Most rib fractures usually heal on their own in 1-3 months. Follow these instructions at home: Managing pain, stiffness, and swelling  If directed, apply ice to the injured area. ? Put ice in a plastic bag. ? Place a towel between your skin and the bag. ? Leave the ice on for 20 minutes, 2-3 times a day.  Take over-the-counter and prescription medicines only as told by your doctor. Activity  Avoid activities that cause pain to the injured area. Protect your injured area.  Slowly increase activity as told by your doctor. General instructions  Do deep breathing as told by your doctor. You may be told to: ? Take deep breaths many times a day. ? Cough many times a day while hugging a pillow. ? Use a device (incentive spirometer) to do deep breathing many times a day.  Drink enough fluid to keep your pee (urine) clear or pale yellow.  Do not wear a rib belt or binder. These do not allow you to breathe deeply.  Keep all follow-up visits as told by your doctor. This is important. Contact a doctor if:  You have a fever. Get help right away if:  You have trouble breathing.  You are short of breath.  You cannot stop coughing.  You cough up thick or bloody spit (sputum).  You feel sick to your stomach (nauseous), throw up (vomit), or have belly (abdominal) pain.  Your pain gets worse and medicine does not help. Summary  A rib fracture is a break or crack in one of the bones of the ribs.  Apply ice to the injured area and take medicines for pain as told by your doctor.  Take deep breaths and cough many times a day. Hug a pillow every time you cough. This information is not intended to replace advice given to you by your health care provider. Make sure you  discuss any questions you have with your health care provider. Document Revised: 07/12/2017 Document Reviewed: 10/30/2016 Elsevier Patient Education  2020 Elsevier Inc.  

## 2020-05-31 NOTE — TOC CAGE-AID Note (Signed)
Transition of Care [facility]) - CAGE-AID Screening   Patient Details  Name: Cameron Patton MRN: [id number] Date of Birth: 1946-01-28  Transition of Care [facility]) CM/SW Contact:    Emeterio Reeve, [place] Phone Number: 05/31/2020, 3:14 PM   Clinical Narrative:  CSW met with pt at bedside. CSW introduced self and explained her role at the hospital.  Pt reports he drinks alcohol 5 days a week. Pt reports he rides the bus to a bar and will eat dinner and have 5-6 beers and sometimes a shot of liquor. Pt states he does not buy beer to keep at the house. Pt reports his car has a breathalyzer so he doesn't take it to the bar. Pt reports he has had multiple DUI's and has been to treatment multiple times.  Pt denies substance use.   Pt was receptive to resources and Scientist, clinical (histocompatibility and immunogenetics). Pt reports that he has a plan to cut down his alcohol use by decreasing the amount of days he goes to the bar. Pt reports instead of 5 days a week he will only go 3 days a week. PT reports his next step will be to cut back the amount of beers. Pt reports if that doesn't work he will consider going to treatment. PT reports there is a place in [place] that he has been to before but is costly. CSW reviewed resource packet to show pt there are places that he can go to detox that doesn't require payment.   CAGE-AID Screening:    Have You Ever Felt You Ought to Cut Down on Your Drinking or Drug Use?: Yes Have People Annoyed You By Critizing Your Drinking Or Drug Use?: Yes Have You Felt Bad Or Guilty About Your Drinking Or Drug Use?: No Have You Ever Had a Drink or Used Drugs First Thing In The Morning to Steady Your Nerves or to Get Rid of a Hangover?: No CAGE-AID Score: 2  Substance Abuse Education Offered: Yes  Substance abuse interventions: Patient Counseling, Educational Materials   Emeterio Reeve, Latanya Presser, [place] Social Worker [phone]

## 2020-05-31 NOTE — ED Notes (Signed)
Cameron Patton SIL- PLEASE CALL WITH UPDATE [phone]

## 2020-05-31 NOTE — Progress Notes (Signed)
Pt. D/C to home with family. All D/C instructions. Pt. And Family voiced understanding. Rx sent home with pt.

## 2020-06-01 ENCOUNTER — Encounter: Payer: Self-pay | Admitting: Family Medicine

## 2020-06-03 ENCOUNTER — Telehealth: Payer: Self-pay | Admitting: *Deleted

## 2020-06-03 NOTE — Telephone Encounter (Signed)
Should be out of the window for alcohol withdrawal since he has been discharged.  Katina Degree. Jimmey Ralph, MD 06/03/2020 4:21 PM

## 2020-06-03 NOTE — Telephone Encounter (Signed)
Patient call stated was in the hospital due to fall. Has Hx of ETOH abuse, was out drinking when he got injury  Taking Rx Oxycodone and Rx Robaxin  Patient want to know if is possible for him to have  seizures due to alcohol withdraw and the mix with this prescription. Please advise

## 2020-06-04 ENCOUNTER — Encounter: Payer: Self-pay | Admitting: Family Medicine

## 2020-06-07 NOTE — Telephone Encounter (Signed)
Spoke with patient  Stated not taking oxycodone x 2 days  He is feeling a bit better  Was sleeping too much with oxycodone. Pt has F/U appt on 06/09/2020

## 2020-06-09 ENCOUNTER — Encounter: Payer: Self-pay | Admitting: Family Medicine

## 2020-06-09 ENCOUNTER — Other Ambulatory Visit: Payer: Self-pay

## 2020-06-09 ENCOUNTER — Ambulatory Visit (INDEPENDENT_AMBULATORY_CARE_PROVIDER_SITE_OTHER): Payer: Medicare Other | Admitting: Family Medicine

## 2020-06-09 VITALS — BP 103/78 | HR 90 | Temp 97.8°F | Ht 70.0 in

## 2020-06-09 DIAGNOSIS — F1010 Alcohol abuse, uncomplicated: Secondary | ICD-10-CM | POA: Diagnosis not present

## 2020-06-09 DIAGNOSIS — Z23 Encounter for immunization: Secondary | ICD-10-CM

## 2020-06-09 NOTE — Assessment & Plan Note (Addendum)
Extensively discussed today. Encouraged cessation. He is actively trying to cut back. He is looking into seeing a substance abuse counselor at [facility]. Does not want referral at this point.

## 2020-06-09 NOTE — Patient Instructions (Addendum)
It was very nice to see you today!  I am glad that you are doing better.  Please cut back on drinking.    We will give you your flu vaccine today.   Take care, Dr Jimmey Ralph  Please try these tips to maintain a healthy lifestyle:   Eat at least 3 REAL meals and 1-2 snacks per day.  Aim for no more than 5 hours between eating.  If you eat breakfast, please do so within one hour of getting up.    Each meal should contain half fruits/vegetables, one quarter protein, and one quarter carbs (no bigger than a computer mouse)   Cut down on sweet beverages. This includes juice, soda, and sweet tea.     Drink at least 1 glass of water with each meal and aim for at least 8 glasses per day   Exercise at least 150 minutes every week.

## 2020-06-09 NOTE — Progress Notes (Signed)
1  Cameron Patton is a 74 y.o. male who presents today for an office visit.  Assessment/Plan:  New/Acute Problems: Rib Fractures Recovering normally.  No red flags.  Encourage pain control and deep inspirations regularly.  Chronic Problems Addressed Today: Alcohol abuse Extensively discussed today. Encouraged cessation. He is actively trying to cut back. He is looking into seeing a substance abuse counselor at [facility]. Does not want referral at this point.      Subjective:  HPI:   Patient here for hospitalization follow up.  He was admitted on 05/31/2020 for 1 day after being found down in a parking lot.  In the ED was found to be acutely intoxicated with alcohol and found to have several right-sided rib fractures.  PT recommended SNF versus home health but patient declined.  He declined alcohol abuse counseling as well.He was prescribed oxycodone and Robaxin and sent home.  Patient states that he was at the bar and took a bus home. As he was getting into the parking lot he thinks he passed out. The bus driver called an ambulance.   Since being home he has been doing well. He is trying to cut back on the amount of alcohol he drinks. Trying to going to the bar less and drinking less when he is there.        Objective:  Physical Exam: BP 103/78   Pulse 90   Temp 97.8 F (36.6 C) (Temporal)   Ht 5\' 10"  (1.778 m)   SpO2 97%   BMI 24.36 kg/m   Gen: No acute distress, resting comfortably Neuro: Grossly normal, moves all extremities Psych: Normal affect and thought content  Time Spent: 45 minutes of total time was spent on the date of the encounter performing the following actions: chart review prior to seeing the patient including recent hospitalization, obtaining history, performing a medically necessary exam, counseling on the treatment plan including alcohol cessation, placing orders, and documenting in our EHR.        . Katina Degree, MD 06/09/2020 11:41 AM

## 2020-06-26 ENCOUNTER — Other Ambulatory Visit: Payer: Self-pay | Admitting: Cardiology

## 2020-06-27 ENCOUNTER — Other Ambulatory Visit: Payer: Self-pay

## 2020-06-27 ENCOUNTER — Ambulatory Visit (INDEPENDENT_AMBULATORY_CARE_PROVIDER_SITE_OTHER): Payer: Medicare Other

## 2020-06-27 DIAGNOSIS — I4891 Unspecified atrial fibrillation: Secondary | ICD-10-CM

## 2020-06-27 DIAGNOSIS — Z7901 Long term (current) use of anticoagulants: Secondary | ICD-10-CM

## 2020-06-27 LAB — POCT INR: INR: 2.1 (ref 2.0–3.0)

## 2020-06-27 NOTE — Patient Instructions (Signed)
Continue taking 1 tablet daily except 1/2 tablet on Sundays, Tuesdays and Thursdays. Repeat INR in 6 weeks.

## 2020-07-23 ENCOUNTER — Other Ambulatory Visit: Payer: Self-pay | Admitting: Cardiology

## 2020-07-26 DIAGNOSIS — Z20822 Contact with and (suspected) exposure to covid-19: Secondary | ICD-10-CM | POA: Diagnosis not present

## 2020-08-18 ENCOUNTER — Other Ambulatory Visit: Payer: Self-pay | Admitting: Cardiology

## 2020-08-19 ENCOUNTER — Ambulatory Visit (INDEPENDENT_AMBULATORY_CARE_PROVIDER_SITE_OTHER): Payer: Medicare Other

## 2020-08-19 ENCOUNTER — Other Ambulatory Visit: Payer: Self-pay

## 2020-08-19 DIAGNOSIS — Z7901 Long term (current) use of anticoagulants: Secondary | ICD-10-CM

## 2020-08-19 DIAGNOSIS — I4891 Unspecified atrial fibrillation: Secondary | ICD-10-CM | POA: Diagnosis not present

## 2020-08-19 LAB — POCT INR: INR: 3.6 — AB (ref 2.0–3.0)

## 2020-08-19 NOTE — Patient Instructions (Signed)
Hold tomorrow and then Continue taking 1 tablet daily except 1/2 tablet on Sundays, Tuesdays and Thursdays. Repeat INR in 6 weeks. Stay consistent with alcohol

## 2020-09-07 NOTE — Progress Notes (Signed)
Cardiology Office Note   Date:  09/08/2020   ID:  Cameron Patton, DOB [birth year], MRN [id number]  PCP:  Ardith Dark, MD  Cardiologist:   Rollene Rotunda, MD   Chief Complaint  Patient presents with  . Atrial Fibrillation      History of Present Illness: Cameron Patton is a 75 y.o. male who presents for follow up of atrial fib.  Since I last saw him he has continued to have significant problems with alcohol intake.  He has lots of strategies for how he will reduce this.  Unfortunately he had a fall in the fall when he was acutely intoxicated.  He ended up in the emergency room.  I did review these records.  His alcohol level was 244.  He actually had digoxin level which was only 0.6.  CTs were unremarkable.  He says he has been working with his primary provider on depression management but has not tolerated medications.  He has not had any further falls and he is trying greatly to reduce his alcohol intake.  He is in fibrillation but he does not feel his arrhythmia.  He does not have palpitations, presyncope or syncope other than associated with acute alcohol intoxication.  He has had no chest pressure, neck or arm discomfort.  Has had no weight gain or edema.   Past Medical History:  Diagnosis Date  . Afib (HCC)   . Allergic rhinitis   . Atrial fibrillation (HCC)   . History of ETOH abuse   . Hx of degenerative disc disease     Past Surgical History:  Procedure Laterality Date  . CATARACT EXTRACTION BILATERAL W/ ANTERIOR VITRECTOMY Bilateral 2015  . INGUINAL HERNIA REPAIR Right 1982     Current Outpatient Medications  Medication Sig Dispense Refill  . acetaminophen (TYLENOL) 500 MG tablet Take 2 tablets (1,000 mg total) by mouth every 8 (eight) hours as needed for mild pain or fever.    . digoxin (LANOXIN) 0.125 MG tablet Take 125 mcg by mouth daily.    . methocarbamol (ROBAXIN) 500 MG tablet Take 1 tablet (500 mg total) by mouth 3 (three) times daily. 60 tablet 0  .  oxyCODONE (OXY IR/ROXICODONE) 5 MG immediate release tablet Take 1 tablet (5 mg total) by mouth every 6 (six) hours as needed for moderate pain or severe pain. 20 tablet 0  . warfarin (COUMADIN) 3 MG tablet TAKE 1/2 TO 1 TABLET DAILY AS DIRECTED BY COUMADIN CLINIC. 90 tablet 0   No current facility-administered medications for this visit.    Allergies:   Patient has no known allergies.    ROS:  Please see the history of present illness.   Otherwise, review of systems are positive none.   All other systems are reviewed and negative.    PHYSICAL EXAM: VS:  BP 102/60 (BP Location: Left Arm, Patient Position: Sitting, Cuff Size: Normal)   Pulse 84   Ht 5\' 10"  (1.778 m)   Wt 173 lb (78.5 kg)   BMI 24.82 kg/m  , BMI Body mass index is 24.82 kg/m.  GENERAL:  Well appearing NECK:  No jugular venous distention, waveform within normal limits, carotid upstroke brisk and symmetric, no bruits, no thyromegaly LUNGS:  Clear to auscultation bilaterally CHEST:  Unremarkable HEART:  PMI not displaced or sustained,S1 and S2 within normal limits, no S3, no clicks, no rubs, no murmurs, irregular  ABD:  Flat, positive bowel sounds normal in frequency in pitch, no bruits, no  rebound, no guarding, no midline pulsatile mass, no hepatomegaly, no splenomegaly EXT:  2 plus pulses throughout, no edema, no cyanosis no clubbing    EKG:  EKG is ordered today demonstrates atrial fibrillation, rate 73, axis within normal limits, intervals within normal limits, no acute ST-T wave changes  Recent Labs: 05/31/2020: ALT 30; BUN 9; Creatinine, Ser 0.99; Hemoglobin 13.4; Platelets 265; Potassium 4.1; Sodium 136    Lipid Panel No results found for: CHOL, TRIG, HDL, CHOLHDL, VLDL, LDLCALC, LDLDIRECT    Wt Readings from Last 3 Encounters:  09/08/20 173 lb (78.5 kg)  05/31/20 169 lb 12.1 oz (77 kg)  11/23/19 172 lb 6.4 oz (78.2 kg)      Other studies Reviewed: Additional studies/ records that were reviewed today  include: Emergency room records  (Greater than 30 minutes reviewing all data with greater than 50% face to face with the patient).  Review of the above records demonstrates:  NA   ASSESSMENT AND PLAN:  Permanent atrial fib:    He tolerates rate control and anticoagulation.   Cameron Patton has a CHA2DS2 - VASc score of is approaching 2.  He and I discussed his drinking quite a bit.  He says he does not pass out or fall other than the event reported.  I discussed with him the significant risk of bleeding and continued anticoagulation with his alcohol intake.  He thinks he can quit drinking or at least greatly reduce it and wants to continue anticoagulation and our agreement is that he would stop it if he has another fall.  He will continue the digoxin for rate control.  Alcohol use:    This was discussed at length as above.  MR: I will follow up an echo when I see him next.    Current medicines are reviewed at length with the patient today.  The patient does not have concerns regarding medicines.  The following changes have been made:     Labs/ tests ordered today include:   Orders Placed This Encounter  Procedures  . EKG 12-Lead     Disposition:   FU with me six months.    Signed, Rollene Rotunda, MD  09/08/2020 4:50 PM    [facility]

## 2020-09-08 ENCOUNTER — Encounter: Payer: Self-pay | Admitting: Cardiology

## 2020-09-08 ENCOUNTER — Other Ambulatory Visit: Payer: Self-pay

## 2020-09-08 ENCOUNTER — Ambulatory Visit: Payer: Medicare Other | Admitting: Cardiology

## 2020-09-08 VITALS — BP 102/60 | HR 84 | Ht 70.0 in | Wt 173.0 lb

## 2020-09-08 DIAGNOSIS — I4891 Unspecified atrial fibrillation: Secondary | ICD-10-CM

## 2020-09-08 DIAGNOSIS — F1010 Alcohol abuse, uncomplicated: Secondary | ICD-10-CM | POA: Diagnosis not present

## 2020-09-08 NOTE — Patient Instructions (Addendum)
Medication Instructions:  Your physician recommends that you continue on your current medications as directed. Please refer to the Current Medication list given to you today.  *If you need a refill on your cardiac medications before your next appointment, please call your pharmacy*  Lab Work: NONE   Testing/Procedures: NONE   Follow-Up: At [facility], you and your health needs are our priority.  As part of our continuing mission to provide you with exceptional heart care, we have created designated Provider Care Teams.  These Care Teams include your primary Cardiologist (physician) and Advanced Practice Providers (APPs -  Physician Assistants and Nurse Practitioners) who all work together to provide you with the care you need, when you need it.  We recommend signing up for the patient portal called "MyChart".  Sign up information is provided on this After Visit Summary.  MyChart is used to connect with patients for Virtual Visits (Telemedicine).  Patients are able to view lab/test results, encounter notes, upcoming appointments, etc.  Non-urgent messages can be sent to your provider as well.   To learn more about what you can do with MyChart, go to [email or web address].    Your next appointment:   6  month(s)  The format for your next appointment:   In Person  Provider:   You may see James Hochrein, MD or one of the following Advanced Practice Providers on your designated Care Team:    Rhonda Barrett, PA-C  Kathryn Lawrence, DNP, ANP      

## 2020-10-09 ENCOUNTER — Other Ambulatory Visit: Payer: Self-pay | Admitting: Cardiology

## 2020-10-14 ENCOUNTER — Ambulatory Visit (INDEPENDENT_AMBULATORY_CARE_PROVIDER_SITE_OTHER): Payer: Medicare Other

## 2020-10-14 ENCOUNTER — Other Ambulatory Visit: Payer: Self-pay

## 2020-10-14 DIAGNOSIS — Z7901 Long term (current) use of anticoagulants: Secondary | ICD-10-CM

## 2020-10-14 DIAGNOSIS — I4891 Unspecified atrial fibrillation: Secondary | ICD-10-CM

## 2020-10-14 LAB — POCT INR: INR: 2.7 (ref 2.0–3.0)

## 2020-10-14 NOTE — Patient Instructions (Signed)
Continue taking 1 tablet daily except 1/2 tablet on Sundays, Tuesdays and Thursdays. Repeat INR in 6 weeks. Stay consistent with alcohol

## 2020-10-26 ENCOUNTER — Telehealth: Payer: Self-pay | Admitting: *Deleted

## 2020-10-26 NOTE — Telephone Encounter (Signed)
Spoke with pharmacists and they need okay to fill pt's digoxin due to having to change manufacturers Will forward to Dr Antoine Poche Pharmacist said this will be temporary but needed okay to change ./cy

## 2020-10-27 NOTE — Telephone Encounter (Signed)
OK to prescribe dig as needed per this request.

## 2020-10-27 NOTE — Telephone Encounter (Signed)
[place] Pharmacists notified may fill med with  different manufacturer per Dr Antoine Poche .Zack Seal

## 2020-11-03 ENCOUNTER — Ambulatory Visit (INDEPENDENT_AMBULATORY_CARE_PROVIDER_SITE_OTHER): Payer: Medicare Other

## 2020-11-03 ENCOUNTER — Other Ambulatory Visit: Payer: Self-pay

## 2020-11-03 VITALS — BP 120/76 | HR 59 | Temp 97.3°F | Resp 20 | Wt 174.6 lb

## 2020-11-03 DIAGNOSIS — Z0000 Encounter for general adult medical examination without abnormal findings: Secondary | ICD-10-CM | POA: Diagnosis not present

## 2020-11-03 NOTE — Progress Notes (Signed)
Subjective:   Cameron Patton is a 75 y.o. male who presents for Medicare Annual/Subsequent preventive examination.  Review of Systems     Cardiac Risk Factors include: advanced age (>7255men, 62>65 women)     Objective:    Today's Vitals   11/03/20 1258  BP: 120/76  Pulse: (!) 59  Resp: 20  Temp: (!) 97.3 F (36.3 C)  SpO2: 97%  Weight: 174 lb 9.6 oz (79.2 kg)   Body mass index is 25.05 kg/m.  Advanced Directives 11/03/2020 05/31/2020 09/29/2019 12/22/2015  Does Patient Have a Medical Advance Directive? Yes No Yes Yes  Type of Diplomatic Services operational officerAdvance Directive Healthcare Power of Attorney - Healthcare Power of Attorney Living will  Does patient want to make changes to medical advance directive? - - No - Patient declined -  Copy of Healthcare Power of Attorney in Chart? No - copy requested - No - copy requested No - copy requested  Would patient like information on creating a medical advance directive? - No - Patient declined - -    Current Medications (verified) Outpatient Encounter Medications as of 11/03/2020  Medication Sig  . acetaminophen (TYLENOL) 500 MG tablet Take 2 tablets (1,000 mg total) by mouth every 8 (eight) hours as needed for mild pain or fever.  . digoxin (LANOXIN) 0.125 MG tablet TAKE 1 TABLET ONCE DAILY.  Marland Kitchen. warfarin (COUMADIN) 3 MG tablet TAKE 1/2 TO 1 TABLET DAILY AS DIRECTED BY COUMADIN CLINIC.  . [DISCONTINUED] methocarbamol (ROBAXIN) 500 MG tablet Take 1 tablet (500 mg total) by mouth 3 (three) times daily. (Patient not taking: Reported on 11/03/2020)  . [DISCONTINUED] oxyCODONE (OXY IR/ROXICODONE) 5 MG immediate release tablet Take 1 tablet (5 mg total) by mouth every 6 (six) hours as needed for moderate pain or severe pain. (Patient not taking: Reported on 11/03/2020)   No facility-administered encounter medications on file as of 11/03/2020.    Allergies (verified) Patient has no known allergies.   History: Past Medical History:  Diagnosis Date  . Afib (HCC)   .  Allergic rhinitis   . Atrial fibrillation (HCC)   . History of ETOH abuse   . Hx of degenerative disc disease    Past Surgical History:  Procedure Laterality Date  . CATARACT EXTRACTION BILATERAL W/ ANTERIOR VITRECTOMY Bilateral 2015  . INGUINAL HERNIA REPAIR Right 1982   Family History  Problem Relation Age of Onset  . Alcoholism Mother        Died age 77  . Stroke Brother   . Cancer Brother        Liver/lung   Social History   Socioeconomic History  . Marital status: Single    Spouse name: Not on file  . Number of children: 2  . Years of education: Not on file  . Highest education level: Not on file  Occupational History  . Occupation: Retired   Tobacco Use  . Smoking status: Never Smoker  . Smokeless tobacco: Never Used  Vaping Use  . Vaping Use: Never used  Substance and Sexual Activity  . Alcohol use: Yes    Alcohol/week: 13.0 standard drinks    Types: 8 Cans of beer, 5 Shots of liquor per week    Comment: Daily  . Drug use: Never  . Sexual activity: Yes    Partners: Female  Other Topics Concern  . Not on file  Social History Narrative   ** Merged History Encounter **       [place] 08[place]  months ago after significant hospital stay with UTI leading to sepsis. Went to rehab for a few months afterwards. Sister in law only family in [place]  Son lives in [place] Daughter lives in [place]    (addiction counselor)    Social Determinants of Health   Financial Resource Strain: Low Risk   . Difficulty of Paying Living Expenses: Not hard at all  Food Insecurity: No Food Insecurity  . Worried About Programme researcher, broadcasting/film/video in the Last Year: Never true  . Ran Out of Food in the Last Year: Never true  Transportation Needs: No Transportation Needs  . Lack of Transportation (Medical): No  . Lack of Transportation (Non-Medical): No  Physical Activity: Insufficiently Active  . Days of Exercise per Week: 4 days  . Minutes of Exercise  per Session: 30 min  Stress: No Stress Concern Present  . Feeling of Stress : Only a little  Social Connections: Socially Isolated  . Frequency of Communication with Friends and Family: Twice a week  . Frequency of Social Gatherings with Friends and Family: Once a week  . Attends Religious Services: Never  . Active Member of Clubs or Organizations: No  . Attends Banker Meetings: Never  . Marital Status: Never married    Tobacco Counseling Counseling given: Not Answered   Clinical Intake:  Pre-visit preparation completed: Yes  Pain : No/denies pain     BMI - recorded: 25.05 Nutritional Status: BMI 25 -29 Overweight Nutritional Risks: None Diabetes: No  How often do you need to have someone help you when you read instructions, pamphlets, or other written materials from your doctor or pharmacy?: 1 - Never  Diabetic?No  Interpreter Needed?: No  Information entered by :: Lanier Ensign, LPN   Activities of Daily Living In your present state of health, do you have any difficulty performing the following activities: 11/03/2020 05/31/2020  Hearing? Y N  Comment left ear -  Vision? N N  Difficulty concentrating or making decisions? N N  Walking or climbing stairs? N Y  Dressing or bathing? N Y  Doing errands, shopping? N N  Preparing Food and eating ? N -  Using the Toilet? N -  In the past six months, have you accidently leaked urine? Y -  Comment frequency at times -  Do you have problems with loss of bowel control? N -  Managing your Medications? N -  Managing your Finances? N -  Housekeeping or managing your Housekeeping? N -  Some recent data might be hidden    Patient Care Team: Ardith Dark, MD as PCP - General (Family Medicine) Rollene Rotunda, MD as PCP - Cardiology (Cardiology) Ardith Dark, MD (Family Medicine)  Indicate any recent Medical Services you may have received from other than [facility] providers in the past year (date may be  approximate).     Assessment:   This is a routine wellness examination for Cameron Patton.  Hearing/Vision screen  Hearing Screening   125Hz  250Hz  500Hz  1000Hz  2000Hz  3000Hz  4000Hz  6000Hz  8000Hz   Right ear:           Left ear:           Comments: Pt having issues with left ear  Vision Screening Comments: Pt has not followed up in last 2 years   Dietary issues and exercise activities discussed: Current Exercise Habits: Home exercise routine, Type of exercise: walking, Time (Minutes): 30, Frequency (Times/Week): 4, Weekly Exercise (Minutes/Week): 120  Goals    .  Patient Stated     Go to [facility] about 4 times a week       Depression Screen PHQ 2/9 Scores 11/03/2020 09/29/2019 12/22/2015  PHQ - 2 Score 1 5 0  PHQ- 9 Score - 22 -    Fall Risk Fall Risk  11/03/2020 09/29/2019 12/22/2015  Falls in the past year? 1 0 No  Number falls in past yr: 1 0 -  Injury with Fall? 1 0 -  Comment cracked 4 ribs - -  Risk for fall due to : History of fall(s);Impaired vision;Impaired balance/gait Other (Comment) -  Risk for fall due to: Comment - Alcohol use -  Follow up Falls prevention discussed Education provided;Falls prevention discussed;Falls evaluation completed -    FALL RISK PREVENTION PERTAINING TO THE HOME:  Any stairs in or around the home? Yes  If so, are there any without handrails? Yes  Home free of loose throw rugs in walkways, pet beds, electrical cords, etc? Yes  Adequate lighting in your home to reduce risk of falls? Yes   ASSISTIVE DEVICES UTILIZED TO PREVENT FALLS:  Life alert? No  Use of a cane, walker or w/c? No  Grab bars in the bathroom? No  Shower chair or bench in shower? No  Elevated toilet seat or a handicapped toilet? No   TIMED UP AND GO:  Was the test performed? Yes .  Length of time to ambulate 10 feet: 10 sec.   Gait slow and steady without use of assistive device  Cognitive Function:     6CIT Screen 11/03/2020  What Year? 0 points  What month? 0 points   Count back from 20 0 points  Months in reverse 0 points  Repeat phrase 0 points    Immunizations Immunization History  Administered Date(s) Administered  . Fluad Quad(high Dose 65+) 06/04/2019, 06/09/2020  . Moderna Sars-Covid-2 Vaccination 12/01/2019, 12/29/2019, 08/22/2020  . Pneumococcal Conjugate-13 05/18/2015    TDAP status: Due, Education has been provided regarding the importance of this vaccine. Advised may receive this vaccine at local pharmacy or Health Dept. Aware to provide a copy of the vaccination record if obtained from local pharmacy or Health Dept. Verbalized acceptance and understanding.  Flu Vaccine status: Up to date  Pneumococcal vaccine status: Declined,  Education has been provided regarding the importance of this vaccine but patient still declined. Advised may receive this vaccine at local pharmacy or Health Dept. Aware to provide a copy of the vaccination record if obtained from local pharmacy or Health Dept. Verbalized acceptance and understanding.   Covid-19 vaccine status: Completed vaccines  Qualifies for Shingles Vaccine? Yes   Zostavax completed No   Shingrix Completed?: No.    Education has been provided regarding the importance of this vaccine. Patient has been advised to call insurance company to determine out of pocket expense if they have not yet received this vaccine. Advised may also receive vaccine at local pharmacy or Health Dept. Verbalized acceptance and understanding.  Screening Tests Health Maintenance  Topic Date Due  . Hepatitis C Screening  Never done  . TETANUS/TDAP  Never done  . PNA vac Low Risk Adult (2 of 2 - PPSV23) 05/17/2016  . INFLUENZA VACCINE  Completed  . COVID-19 Vaccine  Completed  . HPV VACCINES  Aged Out    Health Maintenance  Health Maintenance Due  Topic Date Due  . Hepatitis C Screening  Never done  . TETANUS/TDAP  Never done  . PNA vac Low Risk Adult (2 of  2 - PPSV23) 05/17/2016    Colorectal cancer  screening: No longer required.    Additional Screening:  Hepatitis C Screening: does qualify;   Vision Screening: Recommended annual ophthalmology exams for early detection of glaucoma and other disorders of the eye. Is the patient up to date with their annual eye exam?  No  Who is the provider or what is the name of the office in which the patient attends annual eye exams? Pt has not followed up in 2 years , encouraged pt to make an appt If pt is not established with a provider, would they like to be referred to a provider to establish care? No .   Dental Screening: Recommended annual dental exams for proper oral hygiene  Community Resource Referral / Chronic Care Management: CRR required this visit?  No   CCM required this visit?  No      Plan:     I have personally reviewed and noted the following in the patient's chart:   . Medical and social history . Use of alcohol, tobacco or illicit drugs  . Current medications and supplements . Functional ability and status . Nutritional status . Physical activity . Advanced directives . List of other physicians . Hospitalizations, surgeries, and ER visits in previous 12 months . Vitals . Screenings to include cognitive, depression, and falls . Referrals and appointments  In addition, I have reviewed and discussed with patient certain preventive protocols, quality metrics, and best practice recommendations. A written personalized care plan for preventive services as well as general preventive health recommendations were provided to patient.     Marzella Schlein, LPN   10/26/4006   Nurse Notes: None

## 2020-11-03 NOTE — Patient Instructions (Addendum)
Cameron Patton , Thank you for taking time to come for your Medicare Wellness Visit. I appreciate your ongoing commitment to your health goals. Please review the following plan we discussed and let me know if I can assist you in the future.   Screening recommendations/referrals: Colonoscopy: No longer required Recommended yearly ophthalmology/optometry visit for glaucoma screening and checkup Recommended yearly dental visit for hygiene and checkup  Vaccinations: Influenza vaccine: Up to date Pneumococcal vaccine: due and discussed  Tdap vaccine: Due and discussed Shingles vaccine: Shingrix discussed. Please contact your pharmacy for coverage information.    Covid-19: Completed 4/20 & 12/29/19  Advanced directives: Advance directive discussed with you today. I have provided a copy for you to complete at home and have notarized. Once this is complete please bring a copy in to our office so we can scan it into your chart.  Conditions/risks identified: keep going to the [facility]  Next appointment: Follow up in one year for your annual wellness visit.   Preventive Care 77 Years and Older, Male Preventive care refers to lifestyle choices and visits with your health care provider that can promote health and wellness. What does preventive care include?  A yearly physical exam. This is also called an annual well check.  Dental exams once or twice a year.  Routine eye exams. Ask your health care provider how often you should have your eyes checked.  Personal lifestyle choices, including:  Daily care of your teeth and gums.  Regular physical activity.  Eating a healthy diet.  Avoiding tobacco and drug use.  Limiting alcohol use.  Practicing safe sex.  Taking low doses of aspirin every day.  Taking vitamin and mineral supplements as recommended by your health care provider. What happens during an annual well check? The services and screenings done by your health care provider during your  annual well check will depend on your age, overall health, lifestyle risk factors, and family history of disease. Counseling  Your health care provider may ask you questions about your:  Alcohol use.  Tobacco use.  Drug use.  Emotional well-being.  Home and relationship well-being.  Sexual activity.  Eating habits.  History of falls.  Memory and ability to understand (cognition).  Work and work Astronomer. Screening  You may have the following tests or measurements:  Height, weight, and BMI.  Blood pressure.  Lipid and cholesterol levels. These may be checked every 5 years, or more frequently if you are over 62 years old.  Skin check.  Lung cancer screening. You may have this screening every year starting at age 77 if you have a 30-pack-year history of smoking and currently smoke or have quit within the past 15 years.  Fecal occult blood test (FOBT) of the stool. You may have this test every year starting at age 77.  Flexible sigmoidoscopy or colonoscopy. You may have a sigmoidoscopy every 5 years or a colonoscopy every 10 years starting at age 77.  Prostate cancer screening. Recommendations will vary depending on your family history and other risks.  Hepatitis C blood test.  Hepatitis B blood test.  Sexually transmitted disease (STD) testing.  Diabetes screening. This is done by checking your blood sugar (glucose) after you have not eaten for a while (fasting). You may have this done every 1-3 years.  Abdominal aortic aneurysm (AAA) screening. You may need this if you are a current or former smoker.  Osteoporosis. You may be screened starting at age 77 if you are at high risk.  Talk with your health care provider about your test results, treatment options, and if necessary, the need for more tests. Vaccines  Your health care provider may recommend certain vaccines, such as:  Influenza vaccine. This is recommended every year.  Tetanus, diphtheria, and  acellular pertussis (Tdap, Td) vaccine. You may need a Td booster every 10 years.  Zoster vaccine. You may need this after age 77.  Pneumococcal 13-valent conjugate (PCV13) vaccine. One dose is recommended after age 77.  Pneumococcal polysaccharide (PPSV23) vaccine. One dose is recommended after age 77. Talk to your health care provider about which screenings and vaccines you need and how often you need them. This information is not intended to replace advice given to you by your health care provider. Make sure you discuss any questions you have with your health care provider. Document Released: 08/26/2015 Document Revised: 04/18/2016 Document Reviewed: 05/31/2015 Elsevier Interactive Patient Education  2017 [place] Prevention in the Home Falls can cause injuries. They can happen to people of all ages. There are many things you can do to make your home safe and to help prevent falls. What can I do on the outside of my home?  Regularly fix the edges of walkways and driveways and fix any cracks.  Remove anything that might make you trip as you walk through a door, such as a raised step or threshold.  Trim any bushes or trees on the path to your home.  Use bright outdoor lighting.  Clear any walking paths of anything that might make someone trip, such as rocks or tools.  Regularly check to see if handrails are loose or broken. Make sure that both sides of any steps have handrails.  Any raised decks and porches should have guardrails on the edges.  Have any leaves, snow, or ice cleared regularly.  Use sand or salt on walking paths during winter.  Clean up any spills in your garage right away. This includes oil or grease spills. What can I do in the bathroom?  Use night lights.  Install grab bars by the toilet and in the tub and shower. Do not use towel bars as grab bars.  Use non-skid mats or decals in the tub or shower.  If you need to sit down in the shower, use  a plastic, non-slip stool.  Keep the floor dry. Clean up any water that spills on the floor as soon as it happens.  Remove soap buildup in the tub or shower regularly.  Attach bath mats securely with double-sided non-slip rug tape.  Do not have throw rugs and other things on the floor that can make you trip. What can I do in the bedroom?  Use night lights.  Make sure that you have a light by your bed that is easy to reach.  Do not use any sheets or blankets that are too big for your bed. They should not hang down onto the floor.  Have a firm chair that has side arms. You can use this for support while you get dressed.  Do not have throw rugs and other things on the floor that can make you trip. What can I do in the kitchen?  Clean up any spills right away.  Avoid walking on wet floors.  Keep items that you use a lot in easy-to-reach places.  If you need to reach something above you, use a strong step stool that has a grab bar.  Keep electrical cords out of the way.  Do not use floor polish or wax that makes floors slippery. If you must use wax, use non-skid floor wax.  Do not have throw rugs and other things on the floor that can make you trip. What can I do with my stairs?  Do not leave any items on the stairs.  Make sure that there are handrails on both sides of the stairs and use them. Fix handrails that are broken or loose. Make sure that handrails are as long as the stairways.  Check any carpeting to make sure that it is firmly attached to the stairs. Fix any carpet that is loose or worn.  Avoid having throw rugs at the top or bottom of the stairs. If you do have throw rugs, attach them to the floor with carpet tape.  Make sure that you have a light switch at the top of the stairs and the bottom of the stairs. If you do not have them, ask someone to add them for you. What else can I do to help prevent falls?  Wear shoes that:  Do not have high heels.  Have  rubber bottoms.  Are comfortable and fit you well.  Are closed at the toe. Do not wear sandals.  If you use a stepladder:  Make sure that it is fully opened. Do not climb a closed stepladder.  Make sure that both sides of the stepladder are locked into place.  Ask someone to hold it for you, if possible.  Clearly mark and make sure that you can see:  Any grab bars or handrails.  First and last steps.  Where the edge of each step is.  Use tools that help you move around (mobility aids) if they are needed. These include:  Canes.  Walkers.  Scooters.  Crutches.  Turn on the lights when you go into a dark area. Replace any light bulbs as soon as they burn out.  Set up your furniture so you have a clear path. Avoid moving your furniture around.  If any of your floors are uneven, fix them.  If there are any pets around you, be aware of where they are.  Review your medicines with your doctor. Some medicines can make you feel dizzy. This can increase your chance of falling. Ask your doctor what other things that you can do to help prevent falls. This information is not intended to replace advice given to you by your health care provider. Make sure you discuss any questions you have with your health care provider. Document Released: 05/26/2009 Document Revised: 01/05/2016 Document Reviewed: 09/03/2014 Elsevier Interactive Patient Education  2017 [employer].

## 2020-11-08 ENCOUNTER — Other Ambulatory Visit: Payer: Self-pay | Admitting: Cardiology

## 2020-12-02 ENCOUNTER — Other Ambulatory Visit: Payer: Self-pay

## 2020-12-02 ENCOUNTER — Ambulatory Visit (INDEPENDENT_AMBULATORY_CARE_PROVIDER_SITE_OTHER): Payer: Medicare Other

## 2020-12-02 DIAGNOSIS — Z7901 Long term (current) use of anticoagulants: Secondary | ICD-10-CM

## 2020-12-02 DIAGNOSIS — I4891 Unspecified atrial fibrillation: Secondary | ICD-10-CM | POA: Diagnosis not present

## 2020-12-02 LAB — POCT INR: INR: 2.4 (ref 2.0–3.0)

## 2020-12-02 NOTE — Patient Instructions (Signed)
Continue taking 1 tablet daily except 1/2 tablet on Sundays, Tuesdays and Thursdays. Repeat INR in 6 weeks. Stay consistent with alcohol

## 2021-01-11 ENCOUNTER — Telehealth: Payer: Self-pay | Admitting: Cardiology

## 2021-01-11 MED ORDER — DIGOXIN 125 MCG PO TABS: 125.0000 ug | ORAL_TABLET | Freq: Every day | ORAL | 5 refills | Status: DC

## 2021-01-11 NOTE — Telephone Encounter (Signed)
Let patient know that refills for his medication has been sent to the pharmacy. 

## 2021-01-11 NOTE — Telephone Encounter (Signed)
*  STAT* If patient is at the pharmacy, call can be transferred to refill team.   1. Which medications need to be refilled? (please list name of each medication and dose if known) digoxin (LANOXIN) 0.125 MG tablet    2. Which pharmacy/location (including street and city if local pharmacy) is medication to be sent to? [facility] [place], [place] - [address]  3. Do they need a 30 day or 90 day supply? 30 DAY SUPPLY     PT STATES THAT HE IS GOING OUT OF TOWN AND ONLY HAS ENOUGH MEDICATION TO LAST UNTIL 11TH, NOT ABLE TO REFILL UNTIL THE 14TH, PT IS NEEDING AN OVERRIDE TO REFILL EARLY, REQUESTING THAT WE CALL [facility] IN ORDER TO GET THIS COMPLETED.

## 2021-01-30 ENCOUNTER — Ambulatory Visit (INDEPENDENT_AMBULATORY_CARE_PROVIDER_SITE_OTHER): Payer: Medicare Other

## 2021-01-30 ENCOUNTER — Other Ambulatory Visit: Payer: Self-pay

## 2021-01-30 DIAGNOSIS — I4891 Unspecified atrial fibrillation: Secondary | ICD-10-CM | POA: Diagnosis not present

## 2021-01-30 DIAGNOSIS — Z7901 Long term (current) use of anticoagulants: Secondary | ICD-10-CM | POA: Diagnosis not present

## 2021-01-30 LAB — POCT INR: INR: 1.4 — AB (ref 2.0–3.0)

## 2021-01-30 NOTE — Patient Instructions (Signed)
Take 2 tablets tonight only and then Continue taking 1 tablet daily except 1/2 tablet on Sundays, Tuesdays and Thursdays. Repeat INR in 3 weeks. Stay consistent with alcohol

## 2021-02-20 ENCOUNTER — Other Ambulatory Visit: Payer: Self-pay

## 2021-02-20 ENCOUNTER — Ambulatory Visit (INDEPENDENT_AMBULATORY_CARE_PROVIDER_SITE_OTHER): Payer: Medicare Other

## 2021-02-20 DIAGNOSIS — Z7901 Long term (current) use of anticoagulants: Secondary | ICD-10-CM | POA: Diagnosis not present

## 2021-02-20 DIAGNOSIS — I4891 Unspecified atrial fibrillation: Secondary | ICD-10-CM | POA: Diagnosis not present

## 2021-02-20 LAB — POCT INR: INR: 1.9 — AB (ref 2.0–3.0)

## 2021-02-20 NOTE — Patient Instructions (Signed)
Take 1 more tablet tonight only and then Continue taking 1 tablet daily except 1/2 tablet on Sundays, Tuesdays and Thursdays. Repeat INR in 6 weeks.

## 2021-03-14 ENCOUNTER — Other Ambulatory Visit: Payer: Self-pay | Admitting: Cardiology

## 2021-04-03 ENCOUNTER — Ambulatory Visit (INDEPENDENT_AMBULATORY_CARE_PROVIDER_SITE_OTHER): Payer: Medicare Other

## 2021-04-03 ENCOUNTER — Other Ambulatory Visit: Payer: Self-pay

## 2021-04-03 DIAGNOSIS — Z7901 Long term (current) use of anticoagulants: Secondary | ICD-10-CM

## 2021-04-03 DIAGNOSIS — I4891 Unspecified atrial fibrillation: Secondary | ICD-10-CM

## 2021-04-03 LAB — POCT INR: INR: 2.7 (ref 2.0–3.0)

## 2021-04-03 NOTE — Patient Instructions (Signed)
Description   Continue taking 1 tablet daily except 1/2 tablet on Sundays, Tuesdays and Thursdays. Repeat INR in 6 weeks.

## 2021-05-29 ENCOUNTER — Ambulatory Visit (INDEPENDENT_AMBULATORY_CARE_PROVIDER_SITE_OTHER): Payer: Medicare Other

## 2021-05-29 ENCOUNTER — Other Ambulatory Visit: Payer: Self-pay

## 2021-05-29 DIAGNOSIS — Z7901 Long term (current) use of anticoagulants: Secondary | ICD-10-CM | POA: Diagnosis not present

## 2021-05-29 DIAGNOSIS — I4891 Unspecified atrial fibrillation: Secondary | ICD-10-CM

## 2021-05-29 LAB — POCT INR: INR: 3.6 — AB (ref 2.0–3.0)

## 2021-05-29 NOTE — Patient Instructions (Signed)
Hold Tuesday and Wednesday and then Continue taking 1 tablet daily except 1/2 tablet on Sundays, Tuesdays and Thursdays. Repeat INR in 4 weeks.

## 2021-07-10 ENCOUNTER — Ambulatory Visit (INDEPENDENT_AMBULATORY_CARE_PROVIDER_SITE_OTHER): Payer: Medicare Other

## 2021-07-10 ENCOUNTER — Other Ambulatory Visit: Payer: Self-pay | Admitting: Cardiology

## 2021-07-10 ENCOUNTER — Other Ambulatory Visit: Payer: Self-pay

## 2021-07-10 DIAGNOSIS — I4891 Unspecified atrial fibrillation: Secondary | ICD-10-CM

## 2021-07-10 DIAGNOSIS — Z7901 Long term (current) use of anticoagulants: Secondary | ICD-10-CM | POA: Diagnosis not present

## 2021-07-10 LAB — POCT INR: INR: 2.1 (ref 2.0–3.0)

## 2021-07-10 NOTE — Patient Instructions (Signed)
Continue taking 1 tablet daily except 1/2 tablet on Sundays, Tuesdays and Thursdays. Repeat INR in 7 weeks.

## 2021-07-25 IMAGING — DX DG CHEST 1V PORT
1 series · 1 of 1 positions shown · non-contrast
Comparison: None.

CLINICAL DATA: Initial evaluation for acute trauma, fall.

EXAM:
PORTABLE CHEST 1 VIEW

[chest ap]
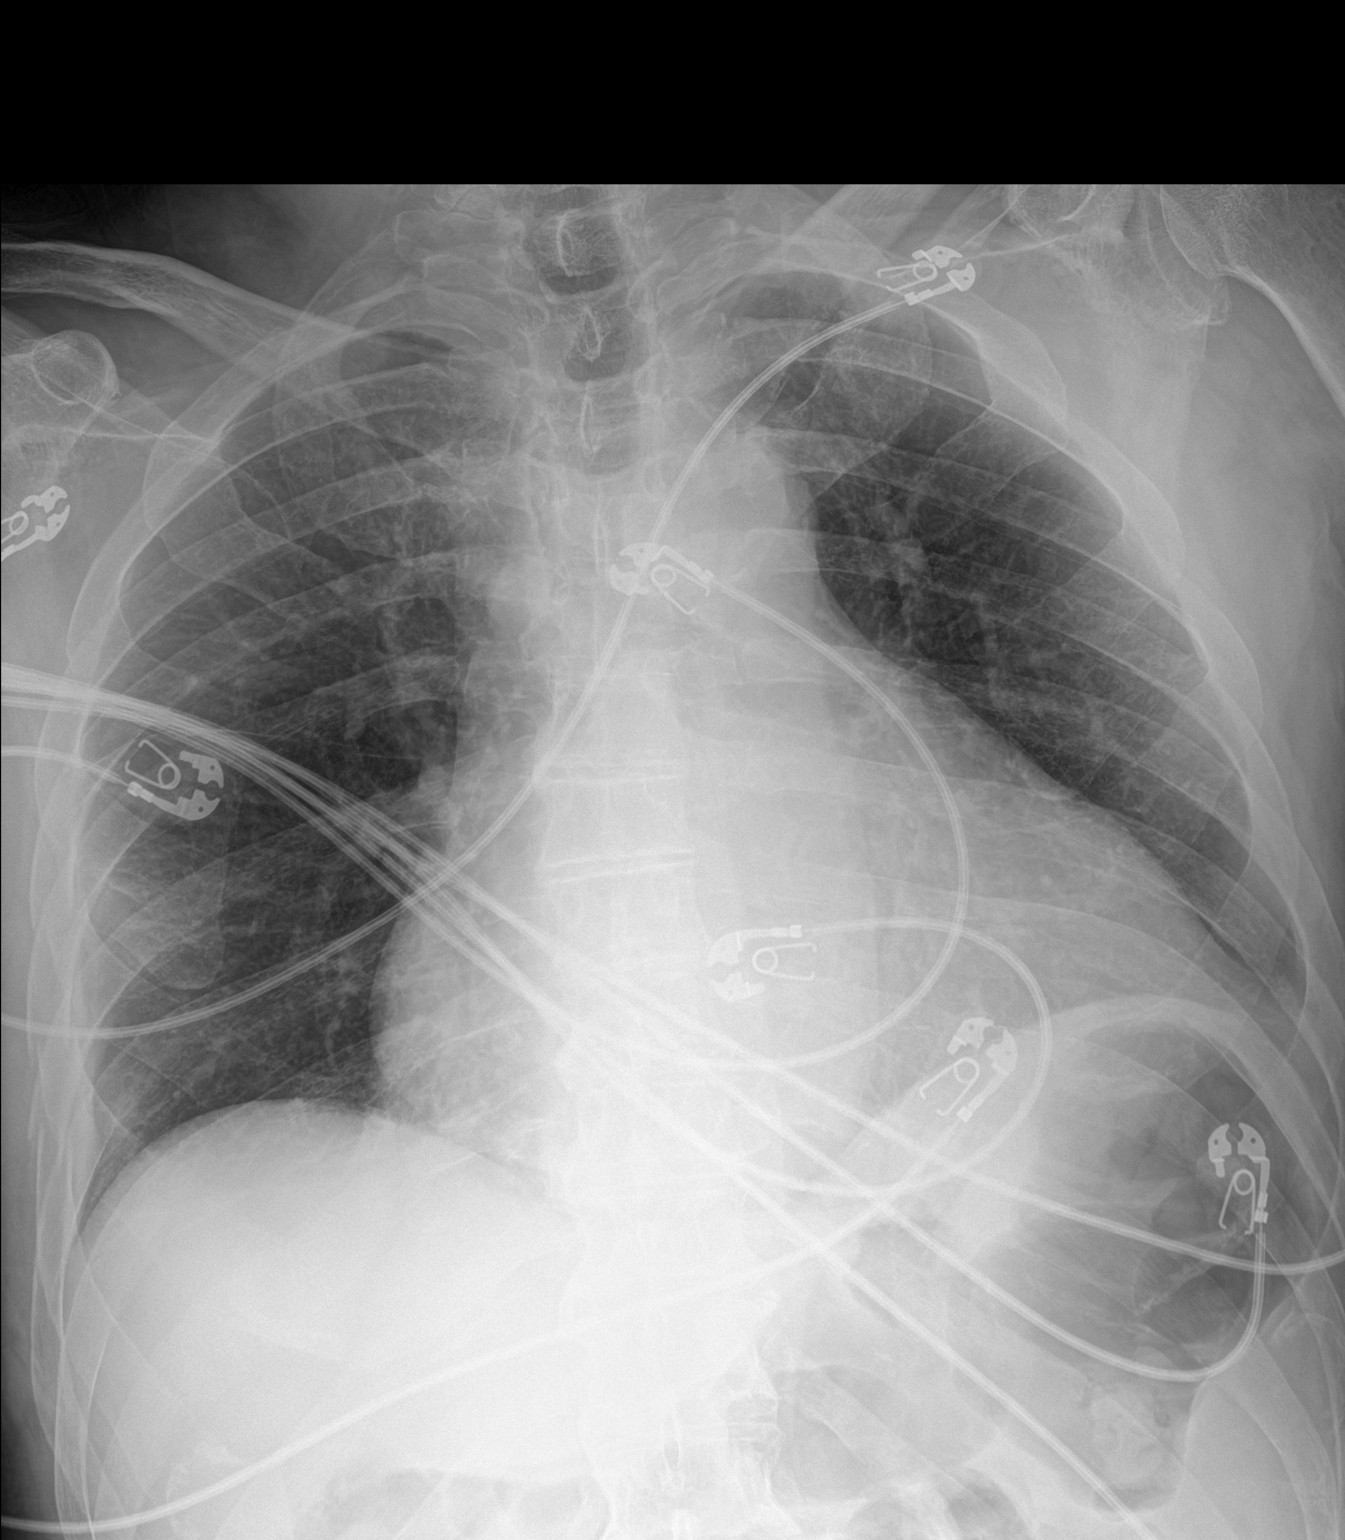

[1 of 1 positions shown; findings below may reference images not displayed]

FINDINGS: Prominent cardiomegaly. Mediastinal silhouette within normal limits.
Aortic atherosclerosis.

Lungs are hypoinflated. No focal infiltrates. No pulmonary edema or
visible pleural effusion. No visible pneumothorax.

There are acute minimally displaced fractures of the right lateral
fifth through seventh ribs. No other visible acute osseous
abnormality.
IMPRESSION: 1. Acute minimally displaced fractures of the right lateral fifth
through seventh ribs. No visible pneumothorax.
2. Cardiomegaly without pulmonary edema.
3.  Aortic Atherosclerosis ([id number]).
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
## 2021-09-07 ENCOUNTER — Ambulatory Visit (INDEPENDENT_AMBULATORY_CARE_PROVIDER_SITE_OTHER): Payer: Medicare Other

## 2021-09-07 ENCOUNTER — Other Ambulatory Visit: Payer: Self-pay

## 2021-09-07 DIAGNOSIS — Z7901 Long term (current) use of anticoagulants: Secondary | ICD-10-CM

## 2021-09-07 DIAGNOSIS — I4891 Unspecified atrial fibrillation: Secondary | ICD-10-CM

## 2021-09-07 LAB — POCT INR: INR: 2.6 (ref 2.0–3.0)

## 2021-09-07 NOTE — Patient Instructions (Signed)
Continue taking 1 tablet daily except 1/2 tablet on Sundays, Tuesdays and Thursdays. Repeat INR in 8 weeks. ?

## 2021-10-09 DIAGNOSIS — H524 Presbyopia: Secondary | ICD-10-CM | POA: Diagnosis not present

## 2021-10-09 DIAGNOSIS — Z961 Presence of intraocular lens: Secondary | ICD-10-CM | POA: Diagnosis not present

## 2021-10-09 DIAGNOSIS — H5203 Hypermetropia, bilateral: Secondary | ICD-10-CM | POA: Diagnosis not present

## 2021-10-09 DIAGNOSIS — H52223 Regular astigmatism, bilateral: Secondary | ICD-10-CM | POA: Diagnosis not present

## 2021-10-28 ENCOUNTER — Other Ambulatory Visit: Payer: Self-pay | Admitting: Cardiology

## 2021-11-02 ENCOUNTER — Ambulatory Visit (INDEPENDENT_AMBULATORY_CARE_PROVIDER_SITE_OTHER): Payer: Medicare Other

## 2021-11-02 ENCOUNTER — Other Ambulatory Visit: Payer: Self-pay

## 2021-11-02 DIAGNOSIS — I4891 Unspecified atrial fibrillation: Secondary | ICD-10-CM | POA: Diagnosis not present

## 2021-11-02 DIAGNOSIS — Z7901 Long term (current) use of anticoagulants: Secondary | ICD-10-CM | POA: Diagnosis not present

## 2021-11-02 LAB — POCT INR: INR: 2 (ref 2.0–3.0)

## 2021-11-02 NOTE — Patient Instructions (Signed)
Continue taking 1 tablet daily except 1/2 tablet on Sundays, Tuesdays and Thursdays. Repeat INR in 8 weeks. ?

## 2021-11-08 ENCOUNTER — Other Ambulatory Visit: Payer: Self-pay | Admitting: Cardiology

## 2021-11-09 ENCOUNTER — Ambulatory Visit: Payer: Medicare Other

## 2021-11-13 ENCOUNTER — Ambulatory Visit: Payer: Medicare Other

## 2021-11-21 ENCOUNTER — Encounter: Payer: Medicare Other | Admitting: Family Medicine

## 2021-12-05 ENCOUNTER — Ambulatory Visit (INDEPENDENT_AMBULATORY_CARE_PROVIDER_SITE_OTHER): Payer: Medicare Other | Admitting: Family Medicine

## 2021-12-05 ENCOUNTER — Other Ambulatory Visit: Payer: Self-pay | Admitting: *Deleted

## 2021-12-05 ENCOUNTER — Encounter: Payer: Self-pay | Admitting: Family Medicine

## 2021-12-05 VITALS — BP 126/82 | HR 55 | Temp 97.4°F | Ht 70.0 in | Wt 167.0 lb

## 2021-12-05 DIAGNOSIS — I4891 Unspecified atrial fibrillation: Secondary | ICD-10-CM

## 2021-12-05 DIAGNOSIS — E785 Hyperlipidemia, unspecified: Secondary | ICD-10-CM

## 2021-12-05 DIAGNOSIS — N400 Enlarged prostate without lower urinary tract symptoms: Secondary | ICD-10-CM

## 2021-12-05 DIAGNOSIS — F1010 Alcohol abuse, uncomplicated: Secondary | ICD-10-CM | POA: Diagnosis not present

## 2021-12-05 DIAGNOSIS — Z0001 Encounter for general adult medical examination with abnormal findings: Secondary | ICD-10-CM

## 2021-12-05 DIAGNOSIS — J309 Allergic rhinitis, unspecified: Secondary | ICD-10-CM | POA: Diagnosis not present

## 2021-12-05 DIAGNOSIS — F32A Depression, unspecified: Secondary | ICD-10-CM | POA: Diagnosis not present

## 2021-12-05 DIAGNOSIS — R739 Hyperglycemia, unspecified: Secondary | ICD-10-CM

## 2021-12-05 DIAGNOSIS — M720 Palmar fascial fibromatosis [Dupuytren]: Secondary | ICD-10-CM | POA: Diagnosis not present

## 2021-12-05 LAB — COMPREHENSIVE METABOLIC PANEL
ALT: 16 U/L (ref 0–53)
AST: 27 U/L (ref 0–37)
Albumin: 4.2 g/dL (ref 3.5–5.2)
Alkaline Phosphatase: 73 U/L (ref 39–117)
BUN: 17 mg/dL (ref 6–23)
CO2: 30 mEq/L (ref 19–32)
Calcium: 9.3 mg/dL (ref 8.4–10.5)
Chloride: 99 mEq/L (ref 96–112)
Creatinine, Ser: 0.86 mg/dL (ref 0.40–1.50)
GFR: 83.17 mL/min (ref >60.00)
Glucose, Bld: 86 mg/dL (ref 70–99)
Potassium: 4.4 mEq/L (ref 3.5–5.1)
Sodium: 137 mEq/L (ref 135–145)
Total Bilirubin: 1 mg/dL (ref 0.2–1.2)
Total Protein: 6.9 g/dL (ref 6.0–8.3)

## 2021-12-05 LAB — LIPID PANEL
Cholesterol: 173 mg/dL (ref 0–200)
HDL: 69.6 mg/dL (ref >39.00)
LDL Cholesterol: 89 mg/dL (ref 0–99)
NonHDL: 103.22
Total CHOL/HDL Ratio: 2
Triglycerides: 71 mg/dL (ref 0.0–149.0)
VLDL: 14.2 mg/dL (ref 0.0–40.0)

## 2021-12-05 LAB — CBC
HCT: 37.1 % — ABNORMAL LOW (ref 39.0–52.0)
Hemoglobin: 12.5 g/dL — ABNORMAL LOW (ref 13.0–17.0)
MCHC: 33.6 g/dL (ref 30.0–36.0)
MCV: 94.1 fl (ref 78.0–100.0)
Platelets: 194 10*3/uL (ref 150.0–400.0)
RBC: 3.95 Mil/uL — ABNORMAL LOW (ref 4.22–5.81)
RDW: 13.7 % (ref 11.5–15.5)
WBC: 5.3 10*3/uL (ref 4.0–10.5)

## 2021-12-05 LAB — TSH: TSH: 2.68 u[IU]/mL (ref 0.35–5.50)

## 2021-12-05 LAB — HEMOGLOBIN A1C: Hgb A1c MFr Bld: 5.6 % (ref 4.6–6.5)

## 2021-12-05 NOTE — Assessment & Plan Note (Signed)
Stable on OTC medications as needed.  ?

## 2021-12-05 NOTE — Assessment & Plan Note (Signed)
Encouraged cessation. Likely contributing to his depression as well. He is trying is cut back.  ?

## 2021-12-05 NOTE — Assessment & Plan Note (Signed)
We discussed treatment options including steroid injection. He would like to continue with watchful waiting for now.  ?

## 2021-12-05 NOTE — Assessment & Plan Note (Signed)
On digoxin and warfarin per cardiology.  ?

## 2021-12-05 NOTE — Progress Notes (Signed)
? ?Chief Complaint:  ?Cameron Patton is a 76 y.o. male who presents today for his annual comprehensive physical exam.   ? ?Assessment/Plan:  ?Chronic Problems Addressed Today: ?Atrial fibrillation (HCC) ?On digoxin and warfarin per cardiology.  ? ?Alcohol abuse ?Encouraged cessation. Likely contributing to his depression as well. He is trying is cut back.  ? ?Dupuytren contracture ?We discussed treatment options including steroid injection. He would like to continue with watchful waiting for now.  ? ?Depression ?Had extensive discussion with patient regarding his depression today. He does have a significant amount of alcohol use which is contributing. We encouraged cessation of alcohol as above. No SI or HI. We discussed treatment options with medications and/or therapy. He is not interested in therapy at this point. Does not want to try any medications today. He will let me know if he would like to do either of these things before his next visit with [place].  ? ?Allergic rhinitis ?Stable on OTC medications as needed.  ? ?Preventative Healthcare: ?Will get blood work done today. No longer needs colon cancer screening due to age. ?Patient Counseling(The following topics were reviewed and/or handout was given): ? -Nutrition: Stressed importance of moderation in sodium/caffeine intake, saturated fat and cholesterol, caloric balance, sufficient intake of fresh fruits, vegetables, and fiber. ? -Stressed the importance of regular exercise.  ? -Substance Abuse: Discussed cessation/primary prevention of tobacco, alcohol, or other drug use; driving or other dangerous activities under the influence; availability of treatment for abuse.  ? -Injury prevention: Discussed safety belts, safety helmets, smoke detector, smoking near bedding or upholstery.  ? -Sexuality: Discussed sexually transmitted diseases, partner selection, use of condoms, avoidance of unintended pregnancy and contraceptive alternatives.  ? -Dental health:  Discussed importance of regular tooth brushing, flossing, and dental visits. ? -Health maintenance and immunizations reviewed. Please refer to Health maintenance section. ? ?Return to care in 1 year for next preventative visit.  ? ?  ?Subjective:  ?HPI: ? ?He has no acute complaints today.  ? ?His main concern today is depression and alcohol intake. He has been dealing with depression over the last few months. He has been feeling depressed due to being far away from his family. He had seen therapist in the past. His depressive symptoms were improving. However, he stopped going to his therapist. Did not think that was helping. He visit his therapist 7-8 times. He is trying to cut down on alcohol. He notes he is trying to go to bar 3 times a week. He drinks 3 shots at every night and 5-8 beers. He has tried few medications including Lexapro for his depression. This has not helped. He had some side effects with Lexapro. He will let us know if he is interested in therapy or medication. No SI or HI. ? ?Lifestyle ?Diet: Balanced. ?Exercise: Likes to walk and gym 30 minutes a day. ? ? ?  11/03/2020  ?  1:08 PM  ?Depression screen PHQ 2/9  ?Decreased Interest 0  ?Down, Depressed, Hopeless 1  ?PHQ - 2 Score 1  ? ? ?Health Maintenance Due  ?Topic Date Due  ? Hepatitis C Screening  Never done  ? TETANUS/TDAP  Never done  ? Zoster Vaccines- Shingrix (1 of 2) Never done  ? Pneumonia Vaccine 49+ Years old (2 - PPSV23 if available, else PCV20) 05/17/2016  ? COVID-19 Vaccine (4 - Booster for Moderna series) 10/17/2020  ?  ? ?ROS: Per HPI, otherwise a complete review of systems was negative.  ? ?PMH: ? ?  The following were reviewed and entered/updated in epic: ?Past Medical History:  ?Diagnosis Date  ? Afib (HCC)   ? Allergic rhinitis   ? Atrial fibrillation (HCC)   ? History of ETOH abuse   ? Hx of degenerative disc disease   ? ?Patient Active Problem List  ? Diagnosis Date Noted  ? Dupuytren contracture 12/05/2021  ? Alcohol abuse  02/27/2018  ? Depression 02/27/2018  ? Long term (current) use of anticoagulants [Z79.01] 02/28/2016  ? Hx of degenerative disc disease 12/22/2015  ? Atrial fibrillation (HCC) 12/22/2015  ? Allergic rhinitis 12/22/2015  ? Benign prostatic hyperplasia 05/18/2015  ? History of atrial fibrillation 09/07/2013  ? ?Past Surgical History:  ?Procedure Laterality Date  ? CATARACT EXTRACTION BILATERAL W/ ANTERIOR VITRECTOMY Bilateral 2015  ? INGUINAL HERNIA REPAIR Right 1982  ? ? ?Family History  ?Problem Relation Age of Onset  ? Alcoholism Mother   ?     Died age 78  ? Stroke Brother   ? Cancer Brother   ?     Liver/lung  ? ? ?Medications- reviewed and updated ?Current Outpatient Medications  ?Medication Sig Dispense Refill  ? acetaminophen (TYLENOL) 500 MG tablet Take 2 tablets (1,000 mg total) by mouth every 8 (eight) hours as needed for mild pain or fever.    ? digoxin (LANOXIN) 0.125 MG tablet Take 1 tablet (125 mcg total) by mouth daily. 30 tablet 2  ? warfarin (COUMADIN) 3 MG tablet TAKE 1/2 TO 1 TABLET DAILY AS DIRECTED BY COUMADIN CLINIC. 90 tablet 0  ? ?No current facility-administered medications for this visit.  ? ? ?Allergies-reviewed and updated ?No Known Allergies ? ?Social History  ? ?Socioeconomic History  ? Marital status: Single  ?  Spouse name: Not on file  ? Number of children: 2  ? Years of education: Not on file  ? Highest education level: Not on file  ?Occupational History  ? Occupation: Retired   ?Tobacco Use  ? Smoking status: Never  ? Smokeless tobacco: Never  ?Vaping Use  ? Vaping Use: Never used  ?Substance and Sexual Activity  ? Alcohol use: Yes  ?  Alcohol/week: 13.0 standard drinks  ?  Types: 8 Cans of beer, 5 Shots of liquor per week  ?  Comment: Daily  ? Drug use: Never  ? Sexual activity: Yes  ?  Partners: Female  ?Other Topics Concern  ? Not on file  ?Social History Narrative  ? ** Merged History Encounter **  ?    ? [place] months ago after significant  hospital stay with UTI leading to sepsis. Went to rehab for a few months afterwards. ?Sister in [place] family in [place]  ?Son lives in [place] ?Daughter lives in [place]   ? (addiction counselor)   ? ?Social Determinants of Health  ? ?Financial Resource Strain: Not on file  ?Food Insecurity: Not on file  ?Transportation Needs: Not on file  ?Physical Activity: Not on file  ?Stress: Not on file  ?Social Connections: Not on file  ? ?   ?  ?Objective:  ?Physical Exam: ?BP 126/82   Pulse (!) 55   Temp (!) 97.4 ?F (36.3 ?C) (Temporal)   Ht 5\' 10"  (1.778 m)   Wt 167 lb (75.8 kg)   SpO2 100%   BMI 23.96 kg/m?   ?Body mass index is 23.96 kg/m?. ?Wt Readings from Last 3 Encounters:  ?12/05/21 167 lb (75.8 kg)  ?11/03/20 174 lb 9.6 oz (79.2 kg)  ?  09/08/20 173 lb (78.5 kg)  ? ?Gen: NAD, resting comfortably ?HEENT: TMs normal bilaterally. OP clear. No thyromegaly noted.  ?CV: RRR with no murmurs appreciated ?Pulm: NWOB, CTAB with no crackles, wheezes, or rhonchi ?GI: Normal bowel sounds present. Soft, Nontender, Nondistended. ?MSK: no edema, cyanosis, or clubbing noted ?Skin: warm, dry ?Neuro: CN2-12 grossly intact. Strength 5/5 in upper and lower extremities. Reflexes symmetric and intact bilaterally.  ?Psych: Normal affect and thought content ?   ? ? ?I,Savera Zaman,acting as a Neurosurgeon for Jacquiline Doe, MD.,have documented all relevant documentation on the behalf of Jacquiline Doe, MD,as directed by  Jacquiline Doe, MD while in the presence of Jacquiline Doe, MD.  ? ?I, Jacquiline Doe, MD, have reviewed all documentation for this visit. The documentation on 12/05/21 for the exam, diagnosis, procedures, and orders are all accurate and complete. ? ?Katina Degree. Jimmey Ralph, MD ?12/05/2021 1:58 PM  ? ?

## 2021-12-05 NOTE — Assessment & Plan Note (Addendum)
Had extensive discussion with patient regarding his depression today. He does have a significant amount of alcohol use which is contributing. We encouraged cessation of alcohol as above. No SI or HI. We discussed treatment options with medications and/or therapy. He is not interested in therapy at this point. Does not want to try any medications today. He will let me know if he would like to do either of these things before his next visit with [place].  ?

## 2021-12-05 NOTE — Patient Instructions (Addendum)
It was very nice to see you today! ? ?Please continue to cut back on drinking! ? ?Please let me know if you would like to have a referral to see a therapist or if you would to start medication.  ? ?We will check blood work today.  ? ?We will see you back in a year for your next physical. Come back sooner if needed.  ? ?Take care, ?Dr Jerline Pain ? ?PLEASE NOTE: ? ?If you had any lab tests please let us know if you have not heard back within a few days. You may see your results on mychart before we have a chance to review them but we will give you a call once they are reviewed by [place]. If we ordered any referrals today, please let us know if you have not heard from their office within the next week.  ? ?Please try these tips to maintain a healthy lifestyle: ? ?Eat at least 3 REAL meals and 1-2 snacks per day.  Aim for no more than 5 hours between eating.  If you eat breakfast, please do so within one hour of getting up.  ? ?Each meal should contain half fruits/vegetables, one quarter protein, and one quarter carbs (no bigger than a computer mouse) ? ?Cut down on sweet beverages. This includes juice, soda, and sweet tea.  ? ?Drink at least 1 glass of water with each meal and aim for at least 8 glasses per day ? ?Exercise at least 150 minutes every week.   ? ?Preventive Care 23 Years and Older, Male ?Preventive care refers to lifestyle choices and visits with your health care provider that can promote health and wellness. Preventive care visits are also called wellness exams. ?What can I expect for my preventive care visit? ?Counseling ?During your preventive care visit, your health care provider may ask about your: ?Medical history, including: ?Past medical problems. ?Family medical history. ?History of falls. ?Current health, including: ?Emotional well-being. ?Home life and relationship well-being. ?Sexual activity. ?Memory and ability to understand (cognition). ?Lifestyle, including: ?Alcohol, nicotine or tobacco, and drug  use. ?Access to firearms. ?Diet, exercise, and sleep habits. ?Work and work Statistician. ?Sunscreen use. ?Safety issues such as seatbelt and bike helmet use. ?Physical exam ?Your health care provider will check your: ?Height and weight. These may be used to calculate your BMI (body mass index). BMI is a measurement that tells if you are at a healthy weight. ?Waist circumference. This measures the distance around your waistline. This measurement also tells if you are at a healthy weight and may help predict your risk of certain diseases, such as type 2 diabetes and high blood pressure. ?Heart rate and blood pressure. ?Body temperature. ?Skin for abnormal spots. ?What immunizations do I need? ? ?Vaccines are usually given at various ages, according to a schedule. Your health care provider will recommend vaccines for you based on your age, medical history, and lifestyle or other factors, such as travel or where you work. ?What tests do I need? ?Screening ?Your health care provider may recommend screening tests for certain conditions. This may include: ?Lipid and cholesterol levels. ?Diabetes screening. This is done by checking your blood sugar (glucose) after you have not eaten for a while (fasting). ?Hepatitis C test. ?Hepatitis B test. ?HIV (human immunodeficiency virus) test. ?STI (sexually transmitted infection) testing, if you are at risk. ?Lung cancer screening. ?Colorectal cancer screening. ?Prostate cancer screening. ?Abdominal aortic aneurysm (AAA) screening. You may need this if you are a current or former smoker. ?  Talk with your health care provider about your test results, treatment options, and if necessary, the need for more tests. ?Follow these instructions at home: ?Eating and drinking ? ?Eat a diet that includes fresh fruits and vegetables, whole grains, lean protein, and low-fat dairy products. Limit your intake of foods with high amounts of sugar, saturated fats, and salt. ?Take vitamin and mineral  supplements as recommended by your health care provider. ?Do not drink alcohol if your health care provider tells you not to drink. ?If you drink alcohol: ?Limit how much you have to 0-2 drinks a day. ?Know how much alcohol is in your drink. In the [place] one drink equals one 12 oz bottle of beer (355 mL), one 5 oz glass of wine (148 mL), or one 1? oz glass of hard liquor (44 mL). ?Lifestyle ?Brush your teeth every morning and night with fluoride toothpaste. Floss one time each day. ?Exercise for at least 30 minutes 5 or more days each week. ?Do not use any products that contain nicotine or tobacco. These products include cigarettes, chewing tobacco, and vaping devices, such as e-cigarettes. If you need help quitting, ask your health care provider. ?Do not use drugs. ?If you are sexually active, practice safe sex. Use a condom or other form of protection to prevent STIs. ?Take aspirin only as told by your health care provider. Make sure that you understand how much to take and what form to take. Work with your health care provider to find out whether it is safe and beneficial for you to take aspirin daily. ?Ask your health care provider if you need to take a cholesterol-lowering medicine (statin). ?Find healthy ways to manage stress, such as: ?Meditation, yoga, or listening to music. ?Journaling. ?Talking to a trusted person. ?Spending time with friends and family. ?Safety ?Always wear your seat belt while driving or riding in a vehicle. ?Do not drive: ?If you have been drinking alcohol. Do not ride with someone who has been drinking. ?When you are tired or distracted. ?While texting. ?If you have been using any mind-altering substances or drugs. ?Wear a helmet and other protective equipment during sports activities. ?If you have firearms in your house, make sure you follow all gun safety procedures. ?Minimize exposure to UV radiation to reduce your risk of skin cancer. ?What's next? ?Visit your health care provider  once a year for an annual wellness visit. ?Ask your health care provider how often you should have your eyes and teeth checked. ?Stay up to date on all vaccines. ?This information is not intended to replace advice given to you by your health care provider. Make sure you discuss any questions you have with your health care provider. ?Document Revised: 01/25/2021 Document Reviewed: 01/25/2021 ?Elsevier Patient Education ? [place]. ? ?

## 2021-12-08 NOTE — Progress Notes (Signed)
Please inform patient of the following: ? ?His blood counts dropped a bit but everything else is NORMAL. Can we have him come back in a week or so to recheck a CBC, ferritin with iron panel and B12? ? ?We can recheck everything else in a year.  ? ?Katina Degree. Jimmey Ralph, MD ?12/08/2021 9:33 AM  ?

## 2021-12-11 ENCOUNTER — Other Ambulatory Visit: Payer: Self-pay

## 2021-12-11 DIAGNOSIS — Z7901 Long term (current) use of anticoagulants: Secondary | ICD-10-CM

## 2021-12-22 ENCOUNTER — Other Ambulatory Visit (INDEPENDENT_AMBULATORY_CARE_PROVIDER_SITE_OTHER): Payer: Medicare Other

## 2021-12-22 DIAGNOSIS — Z7901 Long term (current) use of anticoagulants: Secondary | ICD-10-CM

## 2021-12-22 DIAGNOSIS — Z0001 Encounter for general adult medical examination with abnormal findings: Secondary | ICD-10-CM | POA: Diagnosis not present

## 2021-12-22 LAB — CBC WITH DIFFERENTIAL/PLATELET
Basophils Absolute: 0 10*3/uL (ref 0.0–0.1)
Basophils Relative: 0.5 % (ref 0.0–3.0)
Eosinophils Absolute: 0.2 10*3/uL (ref 0.0–0.7)
Eosinophils Relative: 4.8 % (ref 0.0–5.0)
HCT: 35.8 % — ABNORMAL LOW (ref 39.0–52.0)
Hemoglobin: 12 g/dL — ABNORMAL LOW (ref 13.0–17.0)
Lymphocytes Relative: 25.9 % (ref 12.0–46.0)
Lymphs Abs: 1.1 10*3/uL (ref 0.7–4.0)
MCHC: 33.6 g/dL (ref 30.0–36.0)
MCV: 95.5 fl (ref 78.0–100.0)
Monocytes Absolute: 0.5 10*3/uL (ref 0.1–1.0)
Monocytes Relative: 12.2 % — ABNORMAL HIGH (ref 3.0–12.0)
Neutro Abs: 2.5 10*3/uL (ref 1.4–7.7)
Neutrophils Relative %: 56.6 % (ref 43.0–77.0)
Platelets: 169 10*3/uL (ref 150.0–400.0)
RBC: 3.75 Mil/uL — ABNORMAL LOW (ref 4.22–5.81)
RDW: 14.1 % (ref 11.5–15.5)
WBC: 4.4 10*3/uL (ref 4.0–10.5)

## 2021-12-22 LAB — IBC + FERRITIN
Ferritin: 160.7 ng/mL (ref 22.0–322.0)
Iron: 34 ug/dL — ABNORMAL LOW (ref 42–165)
Saturation Ratios: 12.4 % — ABNORMAL LOW (ref 20.0–50.0)
TIBC: 274.4 ug/dL (ref 250.0–450.0)
Transferrin: 196 mg/dL — ABNORMAL LOW (ref 212.0–360.0)

## 2021-12-22 LAB — VITAMIN B12: Vitamin B-12: 207 pg/mL — ABNORMAL LOW (ref 211–911)

## 2021-12-26 NOTE — Progress Notes (Signed)
Please inform patient of the following: ? ?His B12 is low.  I think that is what is causing his blood counts to be low.  Recommend starting B12 protocol here.  We can recheck in 3 to 6 months. ? ?His iron counts are borderline.  Do not think this is a significant issue though recommend checking a FOBT to make sure that he is not having any signs of internal bleeding.  We can place order and he can come and pick up the stool card.

## 2021-12-27 ENCOUNTER — Other Ambulatory Visit: Payer: Self-pay

## 2021-12-27 ENCOUNTER — Telehealth: Payer: Self-pay | Admitting: Cardiology

## 2021-12-27 ENCOUNTER — Telehealth: Payer: Self-pay | Admitting: Family Medicine

## 2021-12-27 DIAGNOSIS — E611 Iron deficiency: Secondary | ICD-10-CM

## 2021-12-27 NOTE — Telephone Encounter (Signed)
Patient wanted to get Dr. Rosezella [place] advice about his recent lab results done by his PCP Dr. Jerline Pain.  ? ?His B12 was low and Dr. Jerline Pain is recommending the patient get injections. The patient wanted to see want Dr. Percival Spanish recommends ?

## 2021-12-27 NOTE — Telephone Encounter (Signed)
Patient asking for a call back for blood results - aware office has tried calling him twice - patient will log in to mychart to see results. Patient still asking for call back from office.  ?

## 2021-12-27 NOTE — Telephone Encounter (Signed)
See lab note.  

## 2021-12-28 ENCOUNTER — Ambulatory Visit (INDEPENDENT_AMBULATORY_CARE_PROVIDER_SITE_OTHER): Payer: Medicare Other

## 2021-12-28 DIAGNOSIS — Z7901 Long term (current) use of anticoagulants: Secondary | ICD-10-CM

## 2021-12-28 DIAGNOSIS — I4891 Unspecified atrial fibrillation: Secondary | ICD-10-CM

## 2021-12-28 LAB — POCT INR: INR: 2.7 (ref 2.0–3.0)

## 2021-12-28 NOTE — Patient Instructions (Signed)
Continue taking 1 tablet daily except 1/2 tablet on Sundays, Tuesdays and Thursdays. Repeat INR in 8 weeks. ?

## 2021-12-28 NOTE — Telephone Encounter (Signed)
Left detailed message as noted for patient (DPR), call back w/any questions  

## 2022-01-16 ENCOUNTER — Ambulatory Visit (INDEPENDENT_AMBULATORY_CARE_PROVIDER_SITE_OTHER): Payer: Medicare Other

## 2022-01-16 DIAGNOSIS — E538 Deficiency of other specified B group vitamins: Secondary | ICD-10-CM | POA: Diagnosis not present

## 2022-01-16 MED ORDER — CYANOCOBALAMIN 1000 MCG/ML IJ SOLN
1000.0000 ug | Freq: Once | INTRAMUSCULAR | Status: AC
  Administered 2022-01-16: 1000 ug via INTRAMUSCULAR

## 2022-01-16 NOTE — Progress Notes (Signed)
Pt here for B12 injection for Dr Parker. Injection given in left deltoid. Pt tolerated well.   

## 2022-01-22 ENCOUNTER — Other Ambulatory Visit: Payer: Self-pay | Admitting: Cardiology

## 2022-01-23 ENCOUNTER — Ambulatory Visit (INDEPENDENT_AMBULATORY_CARE_PROVIDER_SITE_OTHER): Payer: Medicare Other

## 2022-01-23 DIAGNOSIS — E538 Deficiency of other specified B group vitamins: Secondary | ICD-10-CM

## 2022-01-23 MED ORDER — CYANOCOBALAMIN 1000 MCG/ML IJ SOLN
1000.0000 ug | Freq: Once | INTRAMUSCULAR | Status: AC
  Administered 2022-01-23: 1000 ug via INTRAMUSCULAR

## 2022-01-23 NOTE — Progress Notes (Signed)
Pt here for B12 injection for Dr Parker. Injection given in left deltoid. Pt tolerated well.   

## 2022-01-25 ENCOUNTER — Telehealth: Payer: Self-pay | Admitting: Family Medicine

## 2022-01-25 NOTE — Telephone Encounter (Signed)
Copied from CRM [id number]. Topic: Medicare AWV >> Jan 25, 2022  1:51 PM Payton Doughty wrote: Reason for CRM: Left message for patient to schedule Annual Wellness Visit.  Please schedule with Nurse Health Advisor Lanier Ensign, RN at [facility].  Please call [phone] ask for [facility]

## 2022-01-30 ENCOUNTER — Ambulatory Visit (INDEPENDENT_AMBULATORY_CARE_PROVIDER_SITE_OTHER): Payer: Medicare Other

## 2022-01-30 DIAGNOSIS — E538 Deficiency of other specified B group vitamins: Secondary | ICD-10-CM

## 2022-01-30 MED ORDER — CYANOCOBALAMIN 1000 MCG/ML IJ SOLN
1000.0000 ug | Freq: Once | INTRAMUSCULAR | Status: AC
  Administered 2022-01-30: 1000 ug via INTRAMUSCULAR

## 2022-01-30 NOTE — Progress Notes (Signed)
Pt tolerated b12 injection well. 

## 2022-02-06 ENCOUNTER — Ambulatory Visit: Payer: Medicare Other

## 2022-02-06 ENCOUNTER — Ambulatory Visit (INDEPENDENT_AMBULATORY_CARE_PROVIDER_SITE_OTHER): Payer: Medicare Other

## 2022-02-06 DIAGNOSIS — E538 Deficiency of other specified B group vitamins: Secondary | ICD-10-CM

## 2022-02-06 MED ORDER — CYANOCOBALAMIN 1000 MCG/ML IJ SOLN
1000.0000 ug | Freq: Once | INTRAMUSCULAR | Status: AC
  Administered 2022-02-06: 1000 ug via INTRAMUSCULAR

## 2022-02-20 ENCOUNTER — Encounter: Payer: Self-pay | Admitting: Family Medicine

## 2022-02-20 ENCOUNTER — Encounter: Payer: Self-pay | Admitting: Cardiology

## 2022-02-20 NOTE — Telephone Encounter (Signed)
Please advise 

## 2022-02-20 NOTE — Telephone Encounter (Signed)
Recommend avoiding the caffeine if possible. everything else is okay.

## 2022-02-22 ENCOUNTER — Ambulatory Visit: Payer: Medicare Other | Admitting: *Deleted

## 2022-02-22 DIAGNOSIS — Z7901 Long term (current) use of anticoagulants: Secondary | ICD-10-CM | POA: Diagnosis not present

## 2022-02-22 DIAGNOSIS — I4891 Unspecified atrial fibrillation: Secondary | ICD-10-CM

## 2022-02-22 DIAGNOSIS — Z5181 Encounter for therapeutic drug level monitoring: Secondary | ICD-10-CM

## 2022-02-22 LAB — POCT INR: INR: 2.8 (ref 2.0–3.0)

## 2022-02-22 NOTE — Patient Instructions (Signed)
Description   Continue taking 1 tablet daily except 1/2 tablet on Sundays, Tuesdays and Thursdays. Repeat INR in 8 weeks.

## 2022-02-28 ENCOUNTER — Other Ambulatory Visit: Payer: Self-pay | Admitting: Cardiology

## 2022-02-28 DIAGNOSIS — I4891 Unspecified atrial fibrillation: Secondary | ICD-10-CM

## 2022-02-28 MED ORDER — WARFARIN SODIUM 3 MG PO TABS: ORAL_TABLET | ORAL | 0 refills | Status: DC

## 2022-02-28 NOTE — Telephone Encounter (Signed)
Received request for warfarin refill:  Last INR was 2.8 on 02/22/22 Next INR due on 04/19/22 LOV was 09/08/20 With Daiva Nakayama MD.   Pt is past due for appt with Dr Antoine Poche.  Message sent to schedulers to make appt.  Refill approved x 1 only.

## 2022-02-28 NOTE — Telephone Encounter (Signed)
Prescription refill request received for warfarin Lov: 09/08/20 (Hochrein)  Next INR check: 04/19/22 Warfarin tablet strength: 3mg   Pt overdue for office visit with cardiologist. Placed note on anticoagulation appt to have pt schedule appt with cardiologist when he comes to coumadin clinic. Appropriate dose and refill sent to requested pharmacy.

## 2022-03-08 ENCOUNTER — Ambulatory Visit: Payer: Medicare Other

## 2022-03-13 ENCOUNTER — Ambulatory Visit: Payer: Medicare Other

## 2022-04-19 ENCOUNTER — Ambulatory Visit: Payer: Medicare Other | Attending: Cardiology

## 2022-04-19 DIAGNOSIS — I4891 Unspecified atrial fibrillation: Secondary | ICD-10-CM

## 2022-04-19 DIAGNOSIS — Z7901 Long term (current) use of anticoagulants: Secondary | ICD-10-CM

## 2022-04-19 LAB — POCT INR: INR: 4 — AB (ref 2.0–3.0)

## 2022-04-19 NOTE — Patient Instructions (Signed)
HOLD FRIDAY and then Continue taking 1 tablet daily except 1/2 tablet on Sundays, Tuesdays and Thursdays. Repeat INR in 4 weeks.

## 2022-04-29 ENCOUNTER — Other Ambulatory Visit: Payer: Self-pay | Admitting: Cardiology

## 2022-04-29 NOTE — Progress Notes (Unsigned)
Cardiology Office Note   Date:  04/30/2022   ID:  Cameron Patton, DOB 07-25-1946, MRN [id number]  PCP:  Vivi Barrack, MD  Cardiologist:   Minus Breeding, MD   Chief Complaint  Patient presents with   Atrial Fibrillation    History of Present Illness: Cameron Patton is a 76 y.o. male who presents for follow up of atrial fib.  Since I last saw him he has done okay.  He takes his anticoagulation and seems to be compliant with this.  He does not report any falls which was an issue previously.  He is not reporting any bleeding.  He denies any palpitations, presyncope or syncope.  Has had no chest pressure, neck or arm discomfort.  He has had no weight gain or edema.   Past Medical History:  Diagnosis Date   Allergic rhinitis    Atrial fibrillation (HCC)    History of ETOH abuse    Hx of degenerative disc disease     Past Surgical History:  Procedure Laterality Date   CATARACT EXTRACTION BILATERAL W/ ANTERIOR VITRECTOMY Bilateral 2015   INGUINAL HERNIA REPAIR Right 1982     Current Outpatient Medications  Medication Sig Dispense Refill   acetaminophen (TYLENOL) 500 MG tablet Take 2 tablets (1,000 mg total) by mouth every 8 (eight) hours as needed for mild pain or fever.     digoxin (LANOXIN) 0.125 MG tablet TAKE ONE TABLET BY MOUTH DAILY **NEED OFFICE VISIT** 30 tablet 2   warfarin (COUMADIN) 3 MG tablet TAKE 1/2 TO 1 TABLET DAILY AS DIRECTED by coumadin clinic.  Pt needs appt with Dr Percival Spanish.  Past due.  Please call the office to schedule. 60 tablet 0   No current facility-administered medications for this visit.    Allergies:   Patient has no known allergies.    ROS:  Please see the history of present illness.   Otherwise, review of systems are positive for contracture in his right hand ring finger.   All other systems are reviewed and negative.    PHYSICAL EXAM: VS:  BP (!) 152/78   Pulse 75   Ht 5\' 9"  (1.753 m)   Wt 165 lb 12.8 oz (75.2 kg)   BMI 24.48 kg/m   , BMI Body mass index is 24.48 kg/m.  GENERAL:  Well appearing NECK:  No jugular venous distention, waveform within normal limits, carotid upstroke brisk and symmetric, no bruits, no thyromegaly LUNGS:  Clear to auscultation bilaterally CHEST:  Unremarkable HEART:  PMI not displaced or sustained,S1 and S2 within normal limits, no A1,PF clicks, no rubs, 3 out of 6 holosystolic murmur heard only at the apex and axilla, no diastolic murmurs, irregular ABD:  Flat, positive bowel sounds normal in frequency in pitch, no bruits, no rebound, no guarding, no midline pulsatile mass, no hepatomegaly, no splenomegaly EXT:  2 plus pulses throughout, no edema, no cyanosis no clubbing   EKG:  EKG is  ordered today demonstrates atrial fibrillation, rate 75, axis within normal limits, intervals within normal limits, no acute ST-T wave changes.  Premature ventricular contractions  Recent Labs: 12/05/2021: ALT 16; BUN 17; Creatinine, Ser 0.86; Potassium 4.4; Sodium 137; TSH 2.68 12/22/2021: Hemoglobin 12.0; Platelets 169.0    Lipid Panel    Component Value Date/Time   CHOL 173 12/05/2021 1404   TRIG 71.0 12/05/2021 1404   HDL 69.60 12/05/2021 1404   CHOLHDL 2 12/05/2021 1404   VLDL 14.2 12/05/2021 1404   LDLCALC 89  12/05/2021 1404      Wt Readings from Last 3 Encounters:  04/30/22 165 lb 12.8 oz (75.2 kg)  12/05/21 167 lb (75.8 kg)  11/03/20 174 lb 9.6 oz (79.2 kg)      Other studies Reviewed: Additional studies/ records that were reviewed today include: Labs  Review of the above records demonstrates:  NA   ASSESSMENT AND PLAN:  Permanent atrial fib:    He tolerates rate control and anticoagulation.   Cameron Patton has a CHA2DS2 - VASc score of is   he has good rate control.  No change in therapy  MR:   I will check an echocardiogram as its been 5 years.  HTN: Blood pressure is elevated today but this is quite unusual for him and he will keep a blood pressure diary.  No change in  therapy.  PVCs: He does not feel these.  No change in therapy.   Current medicines are reviewed at length with the patient today.  The patient does not have concerns regarding medicines.  The following changes have been made:    None  Labs/ tests ordered today include:   Orders Placed This Encounter  Procedures   EKG 12-Lead   ECHOCARDIOGRAM COMPLETE     Disposition:   FU with me 12 months.    Signed, Rollene Rotunda, MD  04/30/2022 8:59 AM    [facility]

## 2022-04-29 NOTE — H&P (View-Only) (Signed)
Cardiology Office Note   Date:  04/30/2022   ID:  Cameron Patton, DOB Nov 17, 1945, MRN [id number]  PCP:  Cameron Barrack, MD  Cardiologist:   Cameron Breeding, MD   Chief Complaint  Patient presents with   Atrial Fibrillation    History of Present Illness: Cameron Patton is a 76 y.o. male who presents for follow up of atrial fib.  Since I last saw him he has done okay.  He takes his anticoagulation and seems to be compliant with this.  He does not report any falls which was an issue previously.  He is not reporting any bleeding.  He denies any palpitations, presyncope or syncope.  Has had no chest pressure, neck or arm discomfort.  He has had no weight gain or edema.   Past Medical History:  Diagnosis Date   Allergic rhinitis    Atrial fibrillation (HCC)    History of ETOH abuse    Hx of degenerative disc disease     Past Surgical History:  Procedure Laterality Date   CATARACT EXTRACTION BILATERAL W/ ANTERIOR VITRECTOMY Bilateral 2015   INGUINAL HERNIA REPAIR Right 1982     Current Outpatient Medications  Medication Sig Dispense Refill   acetaminophen (TYLENOL) 500 MG tablet Take 2 tablets (1,000 mg total) by mouth every 8 (eight) hours as needed for mild pain or fever.     digoxin (LANOXIN) 0.125 MG tablet TAKE ONE TABLET BY MOUTH DAILY **NEED OFFICE VISIT** 30 tablet 2   warfarin (COUMADIN) 3 MG tablet TAKE 1/2 TO 1 TABLET DAILY AS DIRECTED by coumadin clinic.  Pt needs appt with Dr Percival Spanish.  Past due.  Please call the office to schedule. 60 tablet 0   No current facility-administered medications for this visit.    Allergies:   Patient has no known allergies.    ROS:  Please see the history of present illness.   Otherwise, review of systems are positive for contracture in his right hand ring finger.   All other systems are reviewed and negative.    PHYSICAL EXAM: VS:  BP (!) 152/78   Pulse 75   Ht 5\' 9"  (1.753 m)   Wt 165 lb 12.8 oz (75.2 kg)   BMI 24.48 kg/m   , BMI Body mass index is 24.48 kg/m.  GENERAL:  Well appearing NECK:  No jugular venous distention, waveform within normal limits, carotid upstroke brisk and symmetric, no bruits, no thyromegaly LUNGS:  Clear to auscultation bilaterally CHEST:  Unremarkable HEART:  PMI not displaced or sustained,S1 and S2 within normal limits, no D6,UY clicks, no rubs, 3 out of 6 holosystolic murmur heard only at the apex and axilla, no diastolic murmurs, irregular ABD:  Flat, positive bowel sounds normal in frequency in pitch, no bruits, no rebound, no guarding, no midline pulsatile mass, no hepatomegaly, no splenomegaly EXT:  2 plus pulses throughout, no edema, no cyanosis no clubbing   EKG:  EKG is  ordered today demonstrates atrial fibrillation, rate 75, axis within normal limits, intervals within normal limits, no acute ST-T wave changes.  Premature ventricular contractions  Recent Labs: 12/05/2021: ALT 16; BUN 17; Creatinine, Ser 0.86; Potassium 4.4; Sodium 137; TSH 2.68 12/22/2021: Hemoglobin 12.0; Platelets 169.0    Lipid Panel    Component Value Date/Time   CHOL 173 12/05/2021 1404   TRIG 71.0 12/05/2021 1404   HDL 69.60 12/05/2021 1404   CHOLHDL 2 12/05/2021 1404   VLDL 14.2 12/05/2021 1404   LDLCALC 89  12/05/2021 1404      Wt Readings from Last 3 Encounters:  04/30/22 165 lb 12.8 oz (75.2 kg)  12/05/21 167 lb (75.8 kg)  11/03/20 174 lb 9.6 oz (79.2 kg)      Other studies Reviewed: Additional studies/ records that were reviewed today include: Labs  Review of the above records demonstrates:  NA   ASSESSMENT AND PLAN:  Permanent atrial fib:    He tolerates rate control and anticoagulation.   [name] has a CHA2DS2 - VASc score of is   he has good rate control.  No change in therapy  MR:   I will check an echocardiogram as its been 5 years.  HTN: Blood pressure is elevated today but this is quite unusual for him and he will keep a blood pressure diary.  No change in  therapy.  PVCs: He does not feel these.  No change in therapy.   Current medicines are reviewed at length with the patient today.  The patient does not have concerns regarding medicines.  The following changes have been made:    None  Labs/ tests ordered today include:   Orders Placed This Encounter  Procedures   EKG 12-Lead   ECHOCARDIOGRAM COMPLETE     Disposition:   FU with me 12 months.    Signed, Rollene Rotunda, MD  04/30/2022 8:59 AM    [facility]

## 2022-04-30 ENCOUNTER — Encounter: Payer: Self-pay | Admitting: Cardiology

## 2022-04-30 ENCOUNTER — Ambulatory Visit: Payer: Medicare Other | Attending: Cardiology | Admitting: Cardiology

## 2022-04-30 VITALS — BP 152/78 | HR 75 | Ht 69.0 in | Wt 165.8 lb

## 2022-04-30 DIAGNOSIS — I340 Nonrheumatic mitral (valve) insufficiency: Secondary | ICD-10-CM | POA: Diagnosis not present

## 2022-04-30 DIAGNOSIS — I482 Chronic atrial fibrillation, unspecified: Secondary | ICD-10-CM | POA: Diagnosis not present

## 2022-04-30 NOTE — Patient Instructions (Signed)
Medication Instructions:  The current medical regimen is effective;  continue present plan and medications.  *If you need a refill on your cardiac medications before your next appointment, please call your pharmacy*   Testing/Procedures: Echocardiogram - Your physician has requested that you have an echocardiogram. Echocardiography is a painless test that uses sound waves to create images of your heart. It provides your doctor with information about the size and shape of your heart and how well your heart's chambers and valves are working. This procedure takes approximately one hour. There are no restrictions for this procedure.     Follow-Up: At [facility], you and your health needs are our priority.  As part of our continuing mission to provide you with exceptional heart care, we have created designated Provider Care Teams.  These Care Teams include your primary Cardiologist (physician) and Advanced Practice Providers (APPs -  Physician Assistants and Nurse Practitioners) who all work together to provide you with the care you need, when you need it.  We recommend signing up for the patient portal called "MyChart".  Sign up information is provided on this After Visit Summary.  MyChart is used to connect with patients for Virtual Visits (Telemedicine).  Patients are able to view lab/test results, encounter notes, upcoming appointments, etc.  Non-urgent messages can be sent to your provider as well.   To learn more about what you can do with MyChart, go to [email or web address].    Your next appointment:   12 month(s)  The format for your next appointment:   In Person  Provider:   James Hochrein, MD        

## 2022-05-01 ENCOUNTER — Ambulatory Visit (HOSPITAL_COMMUNITY): Payer: Medicare Other | Attending: Cardiology

## 2022-05-01 DIAGNOSIS — I340 Nonrheumatic mitral (valve) insufficiency: Secondary | ICD-10-CM | POA: Insufficient documentation

## 2022-05-01 LAB — ECHOCARDIOGRAM COMPLETE
Area-P 1/2: 3.47 cm2
MV M vel: 4.79 m/s
MV Peak grad: 91.8 mmHg
Radius: 0.6 cm
S' Lateral: 2.9 cm

## 2022-05-07 ENCOUNTER — Encounter: Payer: Self-pay | Admitting: *Deleted

## 2022-05-09 ENCOUNTER — Other Ambulatory Visit: Payer: Self-pay | Admitting: *Deleted

## 2022-05-09 ENCOUNTER — Telehealth: Payer: Self-pay | Admitting: Cardiology

## 2022-05-09 ENCOUNTER — Encounter: Payer: Self-pay | Admitting: *Deleted

## 2022-05-09 DIAGNOSIS — I340 Nonrheumatic mitral (valve) insufficiency: Secondary | ICD-10-CM

## 2022-05-09 NOTE — Telephone Encounter (Signed)
Spoke with pt, TEE scheduled for 10/18/253 @ 8:30 am with dr Audie Box. Patient will come on 05/29/22 for stat labs as he is out of town until 05/28/22. Instructions sent to patient via my chart. Lab orders mailed to the pt

## 2022-05-09 NOTE — Telephone Encounter (Signed)
Called patient  - informed patient  will let Dr Percival Spanish know of his itinerary for  upcoming trip    The earliest  possible scheduling would be 10/18 23 -  Wednesday with Dr Jenetta DownerNori Riis for a TEE   (Patient would not need an  another office visit for a History and physical  ) this date is 30 days from last visit.    Next available  time slot 9/20 with Dr Margaretann Loveless , 9/24 with Dr Sallyanne Kuster or /9/26 with Dr Jenetta DownerNori Riis    Patient is aware will contact him back with full details and instruction on upcoming procedure

## 2022-05-09 NOTE — Telephone Encounter (Signed)
Pt sent this via mychart to sching pool:      Dr. Percival Spanish wants  to schedule an exam in the hospital where I will be sedated and a tube put down my throat to photo my heart.  I told him I would prefer the exam to be after my trip to [place]. Originally I misquoted the date so I wanted to let him know my trip to [place] is Oct 9 -16. Thank you.

## 2022-05-16 ENCOUNTER — Other Ambulatory Visit: Payer: Self-pay | Admitting: Cardiology

## 2022-05-16 DIAGNOSIS — I4891 Unspecified atrial fibrillation: Secondary | ICD-10-CM

## 2022-05-16 NOTE — Telephone Encounter (Signed)
Last INR 04/19/2022 Last OV 04/30/2022

## 2022-05-17 ENCOUNTER — Ambulatory Visit: Payer: Medicare Other | Attending: Cardiology

## 2022-05-17 DIAGNOSIS — Z7901 Long term (current) use of anticoagulants: Secondary | ICD-10-CM

## 2022-05-17 DIAGNOSIS — I4891 Unspecified atrial fibrillation: Secondary | ICD-10-CM

## 2022-05-17 LAB — POCT INR: INR: 2.8 (ref 2.0–3.0)

## 2022-05-17 NOTE — Patient Instructions (Signed)
Continue taking 1 tablet daily except 1/2 tablet on Sundays, Tuesdays and Thursdays. Repeat INR in 4 weeks.

## 2022-05-29 DIAGNOSIS — I340 Nonrheumatic mitral (valve) insufficiency: Secondary | ICD-10-CM | POA: Diagnosis not present

## 2022-05-29 LAB — CBC
Hematocrit: 37.1 % — ABNORMAL LOW (ref 37.5–51.0)
Hemoglobin: 12.5 g/dL — ABNORMAL LOW (ref 13.0–17.7)
MCH: 32.7 pg (ref 26.6–33.0)
MCHC: 33.7 g/dL (ref 31.5–35.7)
MCV: 97 fL (ref 79–97)
Platelets: 202 10*3/uL (ref 150–450)
RBC: 3.82 x10E6/uL — ABNORMAL LOW (ref 4.14–5.80)
RDW: 12.4 % (ref 11.6–15.4)
WBC: 3.1 10*3/uL — ABNORMAL LOW (ref 3.4–10.8)

## 2022-05-29 LAB — BASIC METABOLIC PANEL
BUN/Creatinine Ratio: 16 (ref 10–24)
BUN: 15 mg/dL (ref 8–27)
CO2: 27 mmol/L (ref 20–29)
Calcium: 9.3 mg/dL (ref 8.6–10.2)
Chloride: 103 mmol/L (ref 96–106)
Creatinine, Ser: 0.95 mg/dL (ref 0.76–1.27)
Glucose: 93 mg/dL (ref 70–99)
Potassium: 4.5 mmol/L (ref 3.5–5.2)
Sodium: 142 mmol/L (ref 134–144)
eGFR: 82 mL/min/{1.73_m2} (ref >59)

## 2022-05-29 LAB — PROTIME-INR
INR: 2.1 — ABNORMAL HIGH (ref 0.9–1.2)
Prothrombin Time: 21.3 s — ABNORMAL HIGH (ref 9.1–12.0)

## 2022-05-29 NOTE — Anesthesia Preprocedure Evaluation (Addendum)
Anesthesia Evaluation  Patient identified by MRN, date of birth, ID band Patient awake    Reviewed: Allergy & Precautions, H&P , NPO status , Patient's Chart, lab work & pertinent test results  Airway Mallampati: II  TM Distance: >3 FB Neck ROM: Full    Dental no notable dental hx. (+) Poor Dentition, Chipped, Dental Advisory Given, Missing   Pulmonary neg pulmonary ROS   Pulmonary exam normal breath sounds clear to auscultation       Cardiovascular Exercise Tolerance: Good negative cardio ROS + dysrhythmias Atrial Fibrillation  Rhythm:Irregular Rate:Normal + Systolic murmurs    Neuro/Psych  PSYCHIATRIC DISORDERS  Depression    negative neurological ROS  negative psych ROS   GI/Hepatic negative GI ROS, Neg liver ROS,,,(+)     substance abuse  alcohol use  Endo/Other  negative endocrine ROS    Renal/GU negative Renal ROS  negative genitourinary   Musculoskeletal negative musculoskeletal ROS (+) Arthritis , Osteoarthritis,    Abdominal   Peds negative pediatric ROS (+)  Hematology negative hematology ROS (+)   Anesthesia Other Findings   Reproductive/Obstetrics negative OB ROS                             Anesthesia Physical Anesthesia Plan  ASA: 3  Anesthesia Plan: MAC   Post-op Pain Management: Minimal or no pain anticipated   Induction: Intravenous  PONV Risk Score and Plan: 1 and Propofol infusion  Airway Management Planned: Natural Airway, Simple Face Mask and Nasal Cannula  Additional Equipment: None  Intra-op Plan:   Post-operative Plan:   Informed Consent: I have reviewed the patients History and Physical, chart, labs and discussed the procedure including the risks, benefits and alternatives for the proposed anesthesia with the patient or authorized representative who has indicated his/her understanding and acceptance.     Dental advisory given  Plan Discussed  with: Anesthesiologist and CRNA  Anesthesia Plan Comments: (ECHO /23 1. Billowing mitral valve. Centrally directed MR jet likely atrial  functional given normal appearance of leaflets with central jet. Severe  mitral regurgitation is present. The mitral valve is grossly normal.  Severe mitral valve regurgitation. No  evidence of mitral stenosis.  2. Left ventricular ejection fraction, by estimation, is 60 to 65%. The  left ventricle has normal function. The left ventricle has no regional  wall motion abnormalities. There is moderate concentric left ventricular  hypertrophy. Left ventricular  diastolic function could not be evaluated.  3. Right ventricular systolic function is normal. The right ventricular  size is moderately enlarged. There is mildly elevated pulmonary artery  systolic pressure. The estimated right ventricular systolic pressure is  51.8 mmHg.  4. Left atrial size was severely dilated.  5. Right atrial size was severely dilated.  6. Tricuspid valve regurgitation is mild to moderate.  7. The aortic valve is tricuspid. Aortic valve regurgitation is not  visualized. No aortic stenosis is present.  8. The inferior vena cava is dilated in size with <50% respiratory  variability, suggesting right atrial pressure of 15 mmHg.  9. Severe biatrial enlargement. )        Anesthesia Quick Evaluation

## 2022-05-30 ENCOUNTER — Encounter (HOSPITAL_COMMUNITY): Payer: Self-pay | Admitting: Cardiovascular Disease

## 2022-05-30 ENCOUNTER — Encounter (HOSPITAL_COMMUNITY)
Admission: RE | Disposition: A | Discharge status: Home / Self Care | Payer: Self-pay | Source: Ambulatory Visit | Attending: Cardiovascular Disease

## 2022-05-30 ENCOUNTER — Ambulatory Visit (HOSPITAL_BASED_OUTPATIENT_CLINIC_OR_DEPARTMENT_OTHER)
Admission: RE | Admit: 2022-05-30 | Discharge: 2022-05-30 | Disposition: A | Discharge status: Home / Self Care | Payer: Medicare Other | Source: Ambulatory Visit | Attending: Cardiovascular Disease | Admitting: Cardiovascular Disease

## 2022-05-30 ENCOUNTER — Ambulatory Visit (HOSPITAL_COMMUNITY): Payer: Medicare Other | Admitting: Anesthesiology

## 2022-05-30 ENCOUNTER — Ambulatory Visit (HOSPITAL_COMMUNITY)
Admission: RE | Admit: 2022-05-30 | Discharge: 2022-05-30 | Disposition: A | Discharge status: Home / Self Care | Payer: Medicare Other | Source: Ambulatory Visit | Attending: Cardiovascular Disease | Admitting: Cardiovascular Disease

## 2022-05-30 ENCOUNTER — Other Ambulatory Visit: Payer: Self-pay

## 2022-05-30 ENCOUNTER — Ambulatory Visit (HOSPITAL_BASED_OUTPATIENT_CLINIC_OR_DEPARTMENT_OTHER): Payer: Medicare Other | Admitting: Anesthesiology

## 2022-05-30 DIAGNOSIS — M1990 Unspecified osteoarthritis, unspecified site: Secondary | ICD-10-CM | POA: Diagnosis not present

## 2022-05-30 DIAGNOSIS — I081 Rheumatic disorders of both mitral and tricuspid valves: Secondary | ICD-10-CM | POA: Insufficient documentation

## 2022-05-30 DIAGNOSIS — I493 Ventricular premature depolarization: Secondary | ICD-10-CM | POA: Diagnosis not present

## 2022-05-30 DIAGNOSIS — I700 Atherosclerosis of aorta: Secondary | ICD-10-CM | POA: Diagnosis not present

## 2022-05-30 DIAGNOSIS — I10 Essential (primary) hypertension: Secondary | ICD-10-CM | POA: Diagnosis not present

## 2022-05-30 DIAGNOSIS — I4821 Permanent atrial fibrillation: Secondary | ICD-10-CM | POA: Diagnosis not present

## 2022-05-30 DIAGNOSIS — I4891 Unspecified atrial fibrillation: Secondary | ICD-10-CM | POA: Diagnosis not present

## 2022-05-30 DIAGNOSIS — I340 Nonrheumatic mitral (valve) insufficiency: Secondary | ICD-10-CM | POA: Diagnosis not present

## 2022-05-30 HISTORY — PX: TEE WITHOUT CARDIOVERSION: SHX5443

## 2022-05-30 HISTORY — PX: BUBBLE STUDY: SHX6837

## 2022-05-30 LAB — ECHO TEE
AR max vel: 1.84 cm2
AV Area VTI: 1.83 cm2
AV Area mean vel: 2.04 cm2
AV Mean grad: 4 mmHg
AV Peak grad: 8.9 mmHg
Ao pk vel: 1.49 m/s
MV M vel: 5.8 m/s
MV Peak grad: 134.6 mmHg
Radius: 0.9 cm

## 2022-05-30 SURGERY — ECHOCARDIOGRAM, TRANSESOPHAGEAL
Anesthesia: Monitor Anesthesia Care

## 2022-05-30 MED ORDER — PROPOFOL 500 MG/50ML IV EMUL
INTRAVENOUS | Status: DC | PRN
  Administered 2022-05-30: 175 ug/kg/min via INTRAVENOUS

## 2022-05-30 MED ORDER — PROPOFOL 10 MG/ML IV BOLUS
INTRAVENOUS | Status: DC | PRN
  Administered 2022-05-30: 10 mg via INTRAVENOUS
  Administered 2022-05-30: 30 mg via INTRAVENOUS

## 2022-05-30 MED ORDER — SODIUM CHLORIDE 0.9 % IV SOLN: INTRAVENOUS | Status: DC

## 2022-05-30 MED ORDER — GLYCOPYRROLATE PF 0.2 MG/ML IJ SOSY
PREFILLED_SYRINGE | INTRAMUSCULAR | Status: DC | PRN
  Administered 2022-05-30: .1 mg via INTRAVENOUS

## 2022-05-30 MED ORDER — LACTATED RINGERS IV SOLN: INTRAVENOUS | Status: DC | PRN

## 2022-05-30 MED ORDER — PHENYLEPHRINE 80 MCG/ML (10ML) SYRINGE FOR IV PUSH (FOR BLOOD PRESSURE SUPPORT)
PREFILLED_SYRINGE | INTRAVENOUS | Status: DC | PRN
  Administered 2022-05-30: 40 ug via INTRAVENOUS
  Administered 2022-05-30: 80 ug via INTRAVENOUS

## 2022-05-30 NOTE — Progress Notes (Signed)
  Echocardiogram Echocardiogram Transesophageal has been performed.  Cameron Patton 05/30/2022, 10:15 AM

## 2022-05-30 NOTE — Transfer of Care (Addendum)
Immediate Anesthesia Transfer of Care Note  Patient: Cameron Patton  Procedure(s) Performed: TRANSESOPHAGEAL ECHOCARDIOGRAM (TEE) BUBBLE STUDY  Patient Location: PACU  Anesthesia Type:MAC  Level of Consciousness: drowsy  Airway & Oxygen Therapy: Patient Spontanous Breathing  Post-op Assessment: Report given to RN and Post -op Vital signs reviewed and stable  Post vital signs: Reviewed and stable  Last Vitals:  Vitals Value Taken Time  BP 100/66 05/30/22 0855  Temp    Pulse 208 05/30/22 0857  Resp 22 05/30/22 0858  SpO2 94% 05/30/22 0857  Vitals shown include unvalidated device data.  Last Pain:  Vitals:   05/30/22 0733  TempSrc: Temporal  PainSc: 0-No pain         Complications: No notable events documented.

## 2022-05-30 NOTE — Anesthesia Postprocedure Evaluation (Signed)
Anesthesia Post Note  Patient: Cameron Patton  Procedure(s) Performed: TRANSESOPHAGEAL ECHOCARDIOGRAM (TEE) BUBBLE STUDY     Patient location during evaluation: PACU Anesthesia Type: MAC Level of consciousness: awake and alert Pain management: pain level controlled Vital Signs Assessment: post-procedure vital signs reviewed and stable Respiratory status: spontaneous breathing, nonlabored ventilation, respiratory function stable and patient connected to nasal cannula oxygen Cardiovascular status: stable and blood pressure returned to baseline Postop Assessment: no apparent nausea or vomiting Anesthetic complications: no   No notable events documented.  Last Vitals:  Vitals:   05/30/22 0902 05/30/22 0915  BP:  129/67  Pulse: 81 63  Resp: 18 16  Temp:  36.4 C  SpO2: 97% 100%    Last Pain:  Vitals:   05/30/22 0915  TempSrc:   PainSc: 0-No pain                 Eilleen Davoli

## 2022-05-30 NOTE — Anesthesia Postprocedure Evaluation (Signed)
Anesthesia Post Note  Patient: Cameron Patton  Procedure(s) Performed: TRANSESOPHAGEAL ECHOCARDIOGRAM (TEE) BUBBLE STUDY     Patient location during evaluation: PACU Anesthesia Type: MAC Level of consciousness: awake and alert Pain management: pain level controlled Vital Signs Assessment: post-procedure vital signs reviewed and stable Respiratory status: spontaneous breathing, nonlabored ventilation, respiratory function stable and patient connected to nasal cannula oxygen Cardiovascular status: stable and blood pressure returned to baseline Postop Assessment: no apparent nausea or vomiting Anesthetic complications: no   No notable events documented.  Last Vitals:  Vitals:   05/30/22 0902 05/30/22 0915  BP:  129/67  Pulse: 81 63  Resp: 18 16  Temp:  36.4 C  SpO2: 97% 100%    Last Pain:  Vitals:   05/30/22 0915  TempSrc:   PainSc: 0-No pain                 Chrisoula Zegarra      

## 2022-05-30 NOTE — Anesthesia Procedure Notes (Signed)
Procedure Name: General with mask airway Date/Time: 05/30/2022 8:21 AM  Performed by: Erick Colace, CRNAPre-anesthesia Checklist: Patient identified, Emergency Drugs available, Suction available and Patient being monitored Patient Re-evaluated:Patient Re-evaluated prior to induction Oxygen Delivery Method: Nasal cannula Preoxygenation: Pre-oxygenation with 100% oxygen Induction Type: IV induction Airway Equipment and Method: Bite block Dental Injury: Teeth and Oropharynx as per pre-operative assessment

## 2022-05-30 NOTE — CV Procedure (Signed)
    TRANSESOPHAGEAL ECHOCARDIOGRAM   NAME:  Cameron Patton    MRN: [id number] DOB:  12/16/1945    ADMIT DATE: 05/30/2022  INDICATIONS: Mitral regurgitation   PROCEDURE:   Informed consent was obtained prior to the procedure. The risks, benefits and alternatives for the procedure were discussed and the patient comprehended these risks.  Risks include, but are not limited to, cough, sore throat, vomiting, nausea, somnolence, esophageal and stomach trauma or perforation, bleeding, low blood pressure, aspiration, pneumonia, infection, trauma to the teeth and death.    Procedural time out performed. The oropharynx was anesthetized with topical 1% benzocaine.    Anesthesia was administered by Dr. Ambrose Pancoast.  The patient was administered 420 mg of propofol and 0 mg of lidocaine to achieve and maintain moderate conscious sedation.  The patient's heart rate, blood pressure, and oxygen saturation are monitored continuously during the procedure. The period of conscious sedation is 22 minutes, of which I was present face-to-face 100% of this time.   The transesophageal probe was inserted in the esophagus and stomach without difficulty and multiple views were obtained.   COMPLICATIONS:    There were no immediate complications.  KEY FINDINGS:  Severe MR. Normal LV/RV function..  Full report to follow. Further management per primary team.   [place]ake Bells T. Audie Box, MD, [place]  [address], [place], [place] [address] [phone]  8:51 AM

## 2022-05-30 NOTE — Interval H&P Note (Signed)
History and Physical Interval Note:  05/30/2022 8:10 AM  Cameron Patton  has presented today for surgery, with the diagnosis of MITRAL VALVE DISORDER.  The various methods of treatment have been discussed with the patient and family. After consideration of risks, benefits and other options for treatment, the patient has consented to  Procedure(s): TRANSESOPHAGEAL ECHOCARDIOGRAM (TEE) (N/A) as a surgical intervention.  The patient's history has been reviewed, patient examined, no change in status, stable for surgery.  I have reviewed the patient's chart and labs.  Questions were answered to the patient's satisfaction.    NPO for TEE. Labs reviewed. Risks and benefits explained.   [place] Bells T. Audie Box, MD, [place]  [address] [place], [place] [address] [phone]  8:11 AM

## 2022-06-01 NOTE — Progress Notes (Signed)
Spectral doppler was used.   Cameron Bells T. Audie Box, MD, [place]  [address], [place], [place] [address] [phone]  5:29 PM

## 2022-06-03 ENCOUNTER — Encounter (HOSPITAL_COMMUNITY): Payer: Self-pay | Admitting: Cardiovascular Disease

## 2022-06-14 ENCOUNTER — Ambulatory Visit: Payer: Medicare Other | Attending: Cardiology | Admitting: *Deleted

## 2022-06-14 DIAGNOSIS — I4891 Unspecified atrial fibrillation: Secondary | ICD-10-CM

## 2022-06-14 DIAGNOSIS — Z7901 Long term (current) use of anticoagulants: Secondary | ICD-10-CM

## 2022-06-14 LAB — POCT INR: INR: 2.9 (ref 2.0–3.0)

## 2022-06-14 NOTE — Patient Instructions (Signed)
Description   Continue taking warfarin 1 tablet daily except 1/2 tablet on Sundays, Tuesdays and Thursdays. Repeat INR in 5 weeks. Anticoagulation Clinic [phone]

## 2022-06-20 DIAGNOSIS — I340 Nonrheumatic mitral (valve) insufficiency: Secondary | ICD-10-CM | POA: Insufficient documentation

## 2022-06-20 DIAGNOSIS — I700 Atherosclerosis of aorta: Secondary | ICD-10-CM | POA: Insufficient documentation

## 2022-06-20 NOTE — Progress Notes (Signed)
Cardiology Office Note   Date:  06/22/2022   ID:  Cameron, Patton September 09, 1945, MRN [id number]  PCP:  Ardith Dark, MD  Cardiologist:   Rollene Rotunda, MD   Chief Complaint  Patient presents with   Mitral Regurgitation    History of Present Illness: Cameron Patton is a 76 y.o. male who presents for follow up of atrial fib.  He had a TEE in October.  This shows severe MR.  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   EF is 60 - 65%.    ER well was 0.33 cm, RF 57%   He feels okay.  He is not having any new shortness of breath, PND or orthopnea.  He has no palpitations, presyncope or syncope.  He does not notice his atrial fibrillation.  He has had no weight gain or edema.  He has had no recent falls.   Past Medical History:  Diagnosis Date   Allergic rhinitis    Atrial fibrillation (HCC)    History of ETOH abuse    Hx of degenerative disc disease     Past Surgical History:  Procedure Laterality Date   BUBBLE STUDY  05/30/2022   Procedure: BUBBLE STUDY;  Surgeon: Sande Rives, MD;  Location: [facility] ENDOSCOPY;  Service: Cardiovascular;;   CATARACT EXTRACTION BILATERAL W/ ANTERIOR VITRECTOMY Bilateral 2015   INGUINAL HERNIA REPAIR Right 1982   TEE WITHOUT CARDIOVERSION N/A 05/30/2022   Procedure: TRANSESOPHAGEAL ECHOCARDIOGRAM (TEE);  Surgeon: Sande Rives, MD;  Location: [facility] ENDOSCOPY;  Service: Cardiovascular;  Laterality: N/A;     Current Outpatient Medications  Medication Sig Dispense Refill   B Complex-C (B-COMPLEX WITH VITAMIN C) tablet Take 1 tablet by mouth daily.     digoxin (LANOXIN) 0.125 MG tablet Take 1 tablet (0.125 mg total) by mouth daily. 90 tablet 2   ibuprofen (ADVIL) 200 MG tablet Take 200 mg by mouth every 8 (eight) hours as needed (pain.).     phenylephrine (SUDAFED PE) 10 MG TABS tablet Take 10 mg by mouth daily.     warfarin (COUMADIN) 3 MG tablet TAKE 1/2 TO 1 TABLET DAILY AS DIRECTED by Anticoagulation Clinic. (Patient taking differently:  Take 1.5-3 mg by mouth See admin instructions. Take 0.5 tablets (1.5 mg) by mouth on Sundays, Tuesdays, & Thursdays Take 1 tablet (3 mg) by mouth on Mondays, Wednesdays, Fridays & Saturdays.) 90 tablet 0   No current facility-administered medications for this visit.    Allergies:   Patient has no known allergies.    ROS:  Please see the history of present illness.   Otherwise, review of systems are positive for none .   All other systems are reviewed and negative.    PHYSICAL EXAM: VS:  BP 138/68 (BP Location: Left Arm, Patient Position: Sitting, Cuff Size: Normal)   Pulse 70   Ht 5\' 9"  (1.753 m)   Wt 164 lb 6.4 oz (74.6 kg)   SpO2 98%   BMI 24.28 kg/m  , BMI Body mass index is 24.28 kg/m.  GENERAL:  Well appearing NECK:  No jugular venous distention, waveform within normal limits, carotid upstroke brisk and symmetric, no bruits, no thyromegaly LUNGS:  Clear to auscultation bilaterally CHEST:  Unremarkable HEART:  PMI not displaced or sustained,S1 and S2 within normal limits, no S3, no clicks, no rubs, 3 out of 6 apical and axillary holosystolic murmur, no diastolic murmurs, irregular  ABD:  Flat, positive bowel sounds normal in frequency in pitch, no bruits, no  rebound, no guarding, no midline pulsatile mass, no hepatomegaly, no splenomegaly EXT:  2 plus pulses throughout, no edema, no cyanosis no clubbing   EKG:  EKG is  ordered today demonstrates atrial fibrillation, rate 70, axis within normal limits, intervals within normal limits, no acute ST-T wave changes.    TEE:  05/30/22  1. Severe mitral valve regurgitation. 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 Regurgitation jet is central in the P2-P3  distribution. Systolic blunting in all pulmonary  veins. 2D PISA radius 0.9 cm, 2D ERO 0.33 cm2, R vol 65 cc, RF 57%, 3D VCA  0.50 cm2 (oval shape). All quantitation consistent with severe MR. MG 1   mmHG @ 75 bpm. MVA 7.1 cm2 by direct 3D planimetry. PMVL length ~19 mm.  Favorable IAS for transeptal  puncture. Mild posterior annular calcium is present. The mitral valve is  myxomatous. Severe mitral valve regurgitation. No evidence of mitral  stenosis.   2. Left ventricular ejection fraction, by estimation, is 60 to 65%. The  left ventricle has normal function.   3. Right ventricular systolic function is normal. The right ventricular  size is moderately enlarged.   4. Left atrial size was severely dilated. No left atrial/left atrial  appendage thrombus was detected. The LAA emptying velocity was 25 cm/s.   5. Right atrial size was severely dilated.   6. Tricuspid valve regurgitation is mild to moderate.   7. The aortic valve is tricuspid. Aortic valve regurgitation is not  visualized. Aortic valve sclerosis/calcification is present, without any  evidence of aortic stenosis.   8. There is Moderate (Grade III) protruding plaque involving the aortic  arch and descending aorta.   9. Agitated saline contrast bubble study was negative, with no evidence  of any interatrial shunt.     Recent Labs: 12/05/2021: ALT 16; TSH 2.68 05/29/2022: BUN 15; Creatinine, Ser 0.95; Hemoglobin 12.5; Platelets 202; Potassium 4.5; Sodium 142    Lipid Panel    Component Value Date/Time   CHOL 173 12/05/2021 1404   TRIG 71.0 12/05/2021 1404   HDL 69.60 12/05/2021 1404   CHOLHDL 2 12/05/2021 1404   VLDL 14.2 12/05/2021 1404   LDLCALC 89 12/05/2021 1404      Wt Readings from Last 3 Encounters:  06/22/22 164 lb 6.4 oz (74.6 kg)  05/30/22 166 lb (75.3 kg)  04/30/22 165 lb 12.8 oz (75.2 kg)      Other studies Reviewed: Additional studies/ records that were reviewed today include: TEE images personally reviewed  Review of the above records demonstrates: See elsewhere   ASSESSMENT AND PLAN:  Permanent atrial fib:    He tolerates rate control and anticoagulation.   [name] has a  CHA2DS2 - VASc score of is 2.  He has good rate control.  No change in therapy  MR: I am going to send him to Dr. Leafy Ro to consider surgical repair of his severe much regurgitation.  He will cardiac cath.  The patient understands that risks included but are not limited to stroke (1 in 1000), death (1 in 1000), kidney failure [usually temporary] (1 in 500), bleeding (1 in 200), allergic reaction [possibly serious] (1 in 200).  The patient understands and agrees to proceed.   AORTIC PLAQUE:  This was moderate protruding plaque on TEE.  Before starting him on a statin would like to check his liver enzymes given his alcoholism.  HTN: Blood pressure is at target.  No change in therapy.   PVCs: He has not been feeling these.  ETOH: We had a long conversation about this.  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  He has been trying to get himself to go to the bars less frequently and has a plan and a desire not to go to the bars to drink.  He drinks less at home but it does sound like he drinks every day.  He has at times been able to come off the alcohol for 7 to 10 days when he visits his children.  He understands the importance of keeping [place] completely informed of his alcohol use the need for him to have weaned himself off prior to surgery to try to avoid withdrawal.  He understands importance of complete cessation.   Current medicines are reviewed at length with the patient today.  The patient does not have concerns regarding medicines.  The following changes have been made:    None  Labs/ tests ordered today include:    Orders Placed This Encounter  Procedures   Hepatic function panel   Ambulatory referral to Cardiothoracic Surgery   EKG 12-Lead     Disposition:   FU with me after he sees Dr. Leafy Ro.   Signed, Rollene Rotunda, MD  06/22/2022 1:26 PM    [facility]

## 2022-06-20 NOTE — H&P (View-Only) (Signed)
Cardiology Office Note   Date:  06/22/2022   ID:  Cameron, Patton 05-Dec-1945, MRN [id number]  PCP:  Ardith Dark, MD  Cardiologist:   Rollene Rotunda, MD   Chief Complaint  Patient presents with   Mitral Regurgitation    History of Present Illness: Cameron Patton is a 76 y.o. male who presents for follow up of atrial fib.  He had a TEE in October.  This shows severe MR.  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   EF is 60 - 65%.    ER well was 0.33 cm, RF 57%   He feels okay.  He is not having any new shortness of breath, PND or orthopnea.  He has no palpitations, presyncope or syncope.  He does not notice his atrial fibrillation.  He has had no weight gain or edema.  He has had no recent falls.   Past Medical History:  Diagnosis Date   Allergic rhinitis    Atrial fibrillation (HCC)    History of ETOH abuse    Hx of degenerative disc disease     Past Surgical History:  Procedure Laterality Date   BUBBLE STUDY  05/30/2022   Procedure: BUBBLE STUDY;  Surgeon: Sande Rives, MD;  Location: [facility] ENDOSCOPY;  Service: Cardiovascular;;   CATARACT EXTRACTION BILATERAL W/ ANTERIOR VITRECTOMY Bilateral 2015   INGUINAL HERNIA REPAIR Right 1982   TEE WITHOUT CARDIOVERSION N/A 05/30/2022   Procedure: TRANSESOPHAGEAL ECHOCARDIOGRAM (TEE);  Surgeon: Sande Rives, MD;  Location: [facility] ENDOSCOPY;  Service: Cardiovascular;  Laterality: N/A;     Current Outpatient Medications  Medication Sig Dispense Refill   B Complex-C (B-COMPLEX WITH VITAMIN C) tablet Take 1 tablet by mouth daily.     digoxin (LANOXIN) 0.125 MG tablet Take 1 tablet (0.125 mg total) by mouth daily. 90 tablet 2   ibuprofen (ADVIL) 200 MG tablet Take 200 mg by mouth every 8 (eight) hours as needed (pain.).     phenylephrine (SUDAFED PE) 10 MG TABS tablet Take 10 mg by mouth daily.     warfarin (COUMADIN) 3 MG tablet TAKE 1/2 TO 1 TABLET DAILY AS DIRECTED by Anticoagulation Clinic. (Patient taking differently:  Take 1.5-3 mg by mouth See admin instructions. Take 0.5 tablets (1.5 mg) by mouth on Sundays, Tuesdays, & Thursdays Take 1 tablet (3 mg) by mouth on Mondays, Wednesdays, Fridays & Saturdays.) 90 tablet 0   No current facility-administered medications for this visit.    Allergies:   Patient has no known allergies.    ROS:  Please see the history of present illness.   Otherwise, review of systems are positive for none .   All other systems are reviewed and negative.    PHYSICAL EXAM: VS:  BP 138/68 (BP Location: Left Arm, Patient Position: Sitting, Cuff Size: Normal)   Pulse 70   Ht 5\' 9"  (1.753 m)   Wt 164 lb 6.4 oz (74.6 kg)   SpO2 98%   BMI 24.28 kg/m  , BMI Body mass index is 24.28 kg/m.  GENERAL:  Well appearing NECK:  No jugular venous distention, waveform within normal limits, carotid upstroke brisk and symmetric, no bruits, no thyromegaly LUNGS:  Clear to auscultation bilaterally CHEST:  Unremarkable HEART:  PMI not displaced or sustained,S1 and S2 within normal limits, no S3, no clicks, no rubs, 3 out of 6 apical and axillary holosystolic murmur, no diastolic murmurs, irregular  ABD:  Flat, positive bowel sounds normal in frequency in pitch, no bruits, no  rebound, no guarding, no midline pulsatile mass, no hepatomegaly, no splenomegaly EXT:  2 plus pulses throughout, no edema, no cyanosis no clubbing   EKG:  EKG is  ordered today demonstrates atrial fibrillation, rate 70, axis within normal limits, intervals within normal limits, no acute ST-T wave changes.    TEE:  05/30/22  1. Severe mitral valve regurgitation. 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 Regurgitation jet is central in the P2-P3  distribution. Systolic blunting in all pulmonary  veins. 2D PISA radius 0.9 cm, 2D ERO 0.33 cm2, R vol 65 cc, RF 57%, 3D VCA  0.50 cm2 (oval shape). All quantitation consistent with severe MR. MG 1   mmHG @ 75 bpm. MVA 7.1 cm2 by direct 3D planimetry. PMVL length ~19 mm.  Favorable IAS for transeptal  puncture. Mild posterior annular calcium is present. The mitral valve is  myxomatous. Severe mitral valve regurgitation. No evidence of mitral  stenosis.   2. Left ventricular ejection fraction, by estimation, is 60 to 65%. The  left ventricle has normal function.   3. Right ventricular systolic function is normal. The right ventricular  size is moderately enlarged.   4. Left atrial size was severely dilated. No left atrial/left atrial  appendage thrombus was detected. The LAA emptying velocity was 25 cm/s.   5. Right atrial size was severely dilated.   6. Tricuspid valve regurgitation is mild to moderate.   7. The aortic valve is tricuspid. Aortic valve regurgitation is not  visualized. Aortic valve sclerosis/calcification is present, without any  evidence of aortic stenosis.   8. There is Moderate (Grade III) protruding plaque involving the aortic  arch and descending aorta.   9. Agitated saline contrast bubble study was negative, with no evidence  of any interatrial shunt.     Recent Labs: 12/05/2021: ALT 16; TSH 2.68 05/29/2022: BUN 15; Creatinine, Ser 0.95; Hemoglobin 12.5; Platelets 202; Potassium 4.5; Sodium 142    Lipid Panel    Component Value Date/Time   CHOL 173 12/05/2021 1404   TRIG 71.0 12/05/2021 1404   HDL 69.60 12/05/2021 1404   CHOLHDL 2 12/05/2021 1404   VLDL 14.2 12/05/2021 1404   LDLCALC 89 12/05/2021 1404      Wt Readings from Last 3 Encounters:  06/22/22 164 lb 6.4 oz (74.6 kg)  05/30/22 166 lb (75.3 kg)  04/30/22 165 lb 12.8 oz (75.2 kg)      Other studies Reviewed: Additional studies/ records that were reviewed today include: TEE images personally reviewed  Review of the above records demonstrates: See elsewhere   ASSESSMENT AND PLAN:  Permanent atrial fib:    He tolerates rate control and anticoagulation.   Mr. [name] has a  CHA2DS2 - VASc score of is 2.  He has good rate control.  No change in therapy  MR: I am going to send him to Dr. Leafy Ro to consider surgical repair of his severe much regurgitation.  He will cardiac cath.  The patient understands that risks included but are not limited to stroke (1 in 1000), death (1 in 1000), kidney failure [usually temporary] (1 in 500), bleeding (1 in 200), allergic reaction [possibly serious (1 in 200).  The patient understands and agrees to proceed.   We will hold warfarin.  AORTIC PLAQUE:  This was moderate protruding plaque on TEE.  Before starting him on a statin would like to check his liver enzymes  given his alcoholism.    HTN: Blood pressure is at target.  No change in therapy.   PVCs: He has not been feeling these.  ETOH: We had a long conversation about this.  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  He has been trying to get himself to go to the bars less frequently and has a plan and a desire not to go to the bars to drink.  He drinks less at home but it does sound like he drinks every day.  He has at times been able to come off the alcohol for 7 to 10 days when he visits his children.  He understands the importance of keeping [place] completely informed of his alcohol use the need for him to have weaned himself off prior to surgery to try to avoid withdrawal.  He understands importance of complete cessation.   Current medicines are reviewed at length with the patient today.  The patient does not have concerns regarding medicines.  The following changes have been made:    None  Labs/ tests ordered today include:    Orders Placed This Encounter  Procedures   Hepatic function panel   Ambulatory referral to Cardiothoracic Surgery   EKG 12-Lead     Disposition:   FU with me after he sees Dr. Leafy Ro.   Signed, Rollene Rotunda, MD  06/22/2022 1:26 PM    [facility]

## 2022-06-22 ENCOUNTER — Ambulatory Visit: Payer: Medicare Other | Attending: Cardiology | Admitting: Cardiology

## 2022-06-22 ENCOUNTER — Encounter: Payer: Self-pay | Admitting: Cardiology

## 2022-06-22 VITALS — BP 138/68 | HR 70 | Ht 69.0 in | Wt 164.4 lb

## 2022-06-22 DIAGNOSIS — I10 Essential (primary) hypertension: Secondary | ICD-10-CM | POA: Diagnosis not present

## 2022-06-22 DIAGNOSIS — I493 Ventricular premature depolarization: Secondary | ICD-10-CM | POA: Diagnosis not present

## 2022-06-22 DIAGNOSIS — I700 Atherosclerosis of aorta: Secondary | ICD-10-CM | POA: Diagnosis not present

## 2022-06-22 DIAGNOSIS — I4821 Permanent atrial fibrillation: Secondary | ICD-10-CM | POA: Diagnosis not present

## 2022-06-22 DIAGNOSIS — I340 Nonrheumatic mitral (valve) insufficiency: Secondary | ICD-10-CM | POA: Diagnosis not present

## 2022-06-22 NOTE — Patient Instructions (Signed)
Medication Instructions:  The current medical regimen is effective;  continue present plan and medications.  *If you need a refill on your cardiac medications before your next appointment, please call your pharmacy*   Follow-Up: At [facility], you and your health needs are our priority.  As part of our continuing mission to provide you with exceptional heart care, we have created designated Provider Care Teams.  These Care Teams include your primary Cardiologist (physician) and Advanced Practice Providers (APPs -  Physician Assistants and Nurse Practitioners) who all work together to provide you with the care you need, when you need it.  We recommend signing up for the patient portal called "MyChart".  Sign up information is provided on this After Visit Summary.  MyChart is used to connect with patients for Virtual Visits (Telemedicine).  Patients are able to view lab/test results, encounter notes, upcoming appointments, etc.  Non-urgent messages can be sent to your provider as well.   To learn more about what you can do with MyChart, go to [email or web address].    Your next appointment:   After visit with Dr.Weldner   The format for your next appointment:   In Person  Provider:   Rollene Rotunda, MD

## 2022-06-23 LAB — HEPATIC FUNCTION PANEL
ALT: 23 IU/L (ref 0–44)
AST: 39 IU/L (ref 0–40)
Albumin: 4.6 g/dL (ref 3.8–4.8)
Alkaline Phosphatase: 105 IU/L (ref 44–121)
Bilirubin Total: 0.7 mg/dL (ref 0.0–1.2)
Bilirubin, Direct: 0.37 mg/dL (ref 0.00–0.40)
Total Protein: 7.2 g/dL (ref 6.0–8.5)

## 2022-06-25 ENCOUNTER — Telehealth: Payer: Self-pay

## 2022-06-25 DIAGNOSIS — I340 Nonrheumatic mitral (valve) insufficiency: Secondary | ICD-10-CM

## 2022-06-25 NOTE — Telephone Encounter (Signed)
Patient scheduled for heart cath on December 6th at 10:00 AM.   Patient aware to arrive at hospital at 8:00 AM.   Message sent via mychart.

## 2022-06-25 NOTE — Telephone Encounter (Signed)
Pt is returning call. Transferred to Julie, LPN. 

## 2022-06-25 NOTE — Telephone Encounter (Signed)
Spoke with patient, he is able to do the heart cath December 6th- he must have someone bring him, and this was the only day for this.    I will call and get cath scheduled for this day, patient is able to check his mychart, which he will do for instructions.

## 2022-06-25 NOTE — Telephone Encounter (Signed)
Called patient to get him scheduled for heart cath.   Patient will call his sister in law to see what dates she is available to take him.   Patient will call back and let us know, so we can go ahead with scheduling.

## 2022-06-27 ENCOUNTER — Telehealth: Payer: Self-pay | Admitting: *Deleted

## 2022-06-27 ENCOUNTER — Encounter: Payer: Self-pay | Admitting: Cardiology

## 2022-06-27 NOTE — Telephone Encounter (Addendum)
Called pt since he sent a message stating that he was having a heart catha and holding warfarin beginning 07/13/2022. Spoke with pt and thanked him for letting us know since we were not aware. Advised that since he is holding warfarin then he will not need the 07/17/2022 appt. Therefore, moved appt 1 week later since pt will be holding warfarin for 5 days. Also, advised to take an extra 1/2 tablet when he resumes his warfarin.

## 2022-07-16 ENCOUNTER — Telehealth: Payer: Self-pay | Admitting: *Deleted

## 2022-07-16 ENCOUNTER — Other Ambulatory Visit: Payer: Self-pay

## 2022-07-16 DIAGNOSIS — I340 Nonrheumatic mitral (valve) insufficiency: Secondary | ICD-10-CM

## 2022-07-16 NOTE — Telephone Encounter (Addendum)
Cardiac Catheterization scheduled at [facility] for: Wednesday July 18, 2022 10 AM Arrival time and place: [facility] Main Entrance A at: 8 AM  Nothing to eat after midnight prior to procedure, clear liquids until 5 AM day of procedure.  Medication instructions: -Hold:  Coumadin-none 07/13/22 until post procedure -Except hold medications usual morning medications can be taken with sips of water including aspirin 81 mg.  Confirmed patient has responsible adult to drive home post procedure and be with patient first 24 hours after arriving home.  Patient reports no new symptoms concerning for COVID-19 in the past 10 days.  Reviewed procedure instructions with patient-he plans to go to [facility] lab Tuesday morning for BMP/CBC.

## 2022-07-16 NOTE — Telephone Encounter (Signed)
Pt states that Thurston Hole called him again and is requesting call back.

## 2022-07-17 DIAGNOSIS — I340 Nonrheumatic mitral (valve) insufficiency: Secondary | ICD-10-CM | POA: Diagnosis not present

## 2022-07-17 LAB — BASIC METABOLIC PANEL
BUN/Creatinine Ratio: 22 (ref 10–24)
BUN: 17 mg/dL (ref 8–27)
CO2: 28 mmol/L (ref 20–29)
Calcium: 9.7 mg/dL (ref 8.6–10.2)
Chloride: 95 mmol/L — ABNORMAL LOW (ref 96–106)
Creatinine, Ser: 0.78 mg/dL (ref 0.76–1.27)
Glucose: 148 mg/dL — ABNORMAL HIGH (ref 70–99)
Potassium: 4.7 mmol/L (ref 3.5–5.2)
Sodium: 133 mmol/L — ABNORMAL LOW (ref 134–144)
eGFR: 91 mL/min/{1.73_m2} (ref >59)

## 2022-07-17 LAB — PROTIME-INR
INR: 1.4 — ABNORMAL HIGH (ref 0.9–1.2)
Prothrombin Time: 14.4 s — ABNORMAL HIGH (ref 9.1–12.0)

## 2022-07-17 LAB — CBC
Hematocrit: 36.3 % — ABNORMAL LOW (ref 37.5–51.0)
Hemoglobin: 12.5 g/dL — ABNORMAL LOW (ref 13.0–17.7)
MCH: 32.3 pg (ref 26.6–33.0)
MCHC: 34.4 g/dL (ref 31.5–35.7)
MCV: 94 fL (ref 79–97)
Platelets: 192 10*3/uL (ref 150–450)
RBC: 3.87 x10E6/uL — ABNORMAL LOW (ref 4.14–5.80)
RDW: 13.6 % (ref 11.6–15.4)
WBC: 6.2 10*3/uL (ref 3.4–10.8)

## 2022-07-17 NOTE — Telephone Encounter (Signed)
Spoke with patient, states he will go to [facility] lab between 9:30-10 AM this morning.

## 2022-07-18 ENCOUNTER — Telehealth: Payer: Self-pay

## 2022-07-18 ENCOUNTER — Ambulatory Visit (HOSPITAL_COMMUNITY)
Admission: RE | Disposition: A | Discharge status: Home / Self Care | Payer: Self-pay | Source: Ambulatory Visit | Attending: Interventional Cardiology

## 2022-07-18 ENCOUNTER — Ambulatory Visit (HOSPITAL_COMMUNITY)
Admission: RE | Admit: 2022-07-18 | Discharge: 2022-07-18 | Disposition: A | Discharge status: Home / Self Care | Payer: Medicare Other | Source: Ambulatory Visit | Attending: Interventional Cardiology | Admitting: Interventional Cardiology

## 2022-07-18 ENCOUNTER — Other Ambulatory Visit: Payer: Self-pay

## 2022-07-18 DIAGNOSIS — I771 Stricture of artery: Secondary | ICD-10-CM

## 2022-07-18 DIAGNOSIS — F1020 Alcohol dependence, uncomplicated: Secondary | ICD-10-CM | POA: Diagnosis not present

## 2022-07-18 DIAGNOSIS — I2510 Atherosclerotic heart disease of native coronary artery without angina pectoris: Secondary | ICD-10-CM | POA: Diagnosis not present

## 2022-07-18 DIAGNOSIS — I493 Ventricular premature depolarization: Secondary | ICD-10-CM | POA: Insufficient documentation

## 2022-07-18 DIAGNOSIS — Z7901 Long term (current) use of anticoagulants: Secondary | ICD-10-CM | POA: Insufficient documentation

## 2022-07-18 DIAGNOSIS — I340 Nonrheumatic mitral (valve) insufficiency: Secondary | ICD-10-CM | POA: Diagnosis present

## 2022-07-18 DIAGNOSIS — I4821 Permanent atrial fibrillation: Secondary | ICD-10-CM | POA: Diagnosis not present

## 2022-07-18 DIAGNOSIS — I272 Pulmonary hypertension, unspecified: Secondary | ICD-10-CM | POA: Insufficient documentation

## 2022-07-18 DIAGNOSIS — I10 Essential (primary) hypertension: Secondary | ICD-10-CM | POA: Diagnosis not present

## 2022-07-18 DIAGNOSIS — Z01812 Encounter for preprocedural laboratory examination: Secondary | ICD-10-CM

## 2022-07-18 HISTORY — PX: ABDOMINAL AORTOGRAM: CATH118222

## 2022-07-18 HISTORY — PX: RIGHT/LEFT HEART CATH AND CORONARY ANGIOGRAPHY: CATH118266

## 2022-07-18 LAB — POCT I-STAT 7, (LYTES, BLD GAS, ICA,H+H)
Acid-Base Excess: 0 mmol/L (ref 0.0–2.0)
Bicarbonate: 23.9 mmol/L (ref 20.0–28.0)
Calcium, Ion: 1.16 mmol/L (ref 1.15–1.40)
HCT: 32 % — ABNORMAL LOW (ref 39.0–52.0)
Hemoglobin: 10.9 g/dL — ABNORMAL LOW (ref 13.0–17.0)
O2 Saturation: 97 %
Potassium: 3.9 mmol/L (ref 3.5–5.1)
Sodium: 134 mmol/L — ABNORMAL LOW (ref 135–145)
TCO2: 25 mmol/L (ref 22–32)
pCO2 arterial: 35.7 mmHg (ref 32–48)
pH, Arterial: 7.434 (ref 7.35–7.45)
pO2, Arterial: 88 mmHg (ref 83–108)

## 2022-07-18 LAB — POCT I-STAT EG7
Acid-Base Excess: 0 mmol/L (ref 0.0–2.0)
Bicarbonate: 25.9 mmol/L (ref 20.0–28.0)
Calcium, Ion: 1.18 mmol/L (ref 1.15–1.40)
HCT: 33 % — ABNORMAL LOW (ref 39.0–52.0)
Hemoglobin: 11.2 g/dL — ABNORMAL LOW (ref 13.0–17.0)
O2 Saturation: 65 %
Potassium: 3.9 mmol/L (ref 3.5–5.1)
Sodium: 134 mmol/L — ABNORMAL LOW (ref 135–145)
TCO2: 27 mmol/L (ref 22–32)
pCO2, Ven: 45.4 mmHg (ref 44–60)
pH, Ven: 7.365 (ref 7.25–7.43)
pO2, Ven: 35 mmHg (ref 32–45)

## 2022-07-18 LAB — PROTIME-INR
INR: 1.4 — ABNORMAL HIGH (ref 0.8–1.2)
Prothrombin Time: 16.9 seconds — ABNORMAL HIGH (ref 11.4–15.2)

## 2022-07-18 SURGERY — RIGHT/LEFT HEART CATH AND CORONARY ANGIOGRAPHY
Anesthesia: LOCAL

## 2022-07-18 MED ORDER — HEPARIN SODIUM (PORCINE) 1000 UNIT/ML IJ SOLN
INTRAMUSCULAR | Status: AC
  Filled 2022-07-18: qty 10

## 2022-07-18 MED ORDER — HEPARIN (PORCINE) IN NACL 1000-0.9 UT/500ML-% IV SOLN
INTRAVENOUS | Status: AC
  Filled 2022-07-18: qty 1000

## 2022-07-18 MED ORDER — SODIUM CHLORIDE 0.9 % WEIGHT BASED INFUSION: 3.0000 mL/kg/h | INTRAVENOUS | Status: AC

## 2022-07-18 MED ORDER — LIDOCAINE HCL (PF) 1 % IJ SOLN
INTRAMUSCULAR | Status: AC
  Filled 2022-07-18: qty 30

## 2022-07-18 MED ORDER — SODIUM CHLORIDE 0.9 % IV SOLN: 250.0000 mL | INTRAVENOUS | Status: DC | PRN

## 2022-07-18 MED ORDER — MIDAZOLAM HCL 2 MG/2ML IJ SOLN
INTRAMUSCULAR | Status: AC
  Filled 2022-07-18: qty 2

## 2022-07-18 MED ORDER — ASPIRIN 81 MG PO CHEW: 81.0000 mg | CHEWABLE_TABLET | ORAL | Status: DC

## 2022-07-18 MED ORDER — FENTANYL CITRATE (PF) 100 MCG/2ML IJ SOLN
INTRAMUSCULAR | Status: AC
  Filled 2022-07-18: qty 2

## 2022-07-18 MED ORDER — ONDANSETRON HCL 4 MG/2ML IJ SOLN: 4.0000 mg | Freq: Four times a day (QID) | INTRAMUSCULAR | Status: DC | PRN

## 2022-07-18 MED ORDER — SODIUM CHLORIDE 0.9% FLUSH: 3.0000 mL | Freq: Two times a day (BID) | INTRAVENOUS | Status: DC

## 2022-07-18 MED ORDER — MIDAZOLAM HCL 2 MG/2ML IJ SOLN
INTRAMUSCULAR | Status: DC | PRN
  Administered 2022-07-18: 2 mg via INTRAVENOUS
  Administered 2022-07-18: 1 mg via INTRAVENOUS

## 2022-07-18 MED ORDER — FENTANYL CITRATE (PF) 100 MCG/2ML IJ SOLN
INTRAMUSCULAR | Status: DC | PRN
  Administered 2022-07-18 (×2): 25 ug via INTRAVENOUS

## 2022-07-18 MED ORDER — HEPARIN (PORCINE) IN NACL 1000-0.9 UT/500ML-% IV SOLN
INTRAVENOUS | Status: DC | PRN
  Administered 2022-07-18 (×2): 500 mL

## 2022-07-18 MED ORDER — VERAPAMIL HCL 2.5 MG/ML IV SOLN
INTRAVENOUS | Status: DC | PRN
  Administered 2022-07-18: 10 mL via INTRA_ARTERIAL

## 2022-07-18 MED ORDER — VERAPAMIL HCL 2.5 MG/ML IV SOLN
INTRAVENOUS | Status: AC
  Filled 2022-07-18: qty 2

## 2022-07-18 MED ORDER — LABETALOL HCL 5 MG/ML IV SOLN: 10.0000 mg | INTRAVENOUS | Status: DC | PRN

## 2022-07-18 MED ORDER — ACETAMINOPHEN 325 MG PO TABS: 650.0000 mg | ORAL_TABLET | ORAL | Status: DC | PRN

## 2022-07-18 MED ORDER — LIDOCAINE HCL (PF) 1 % IJ SOLN
INTRAMUSCULAR | Status: DC | PRN
  Administered 2022-07-18: 5 mL
  Administered 2022-07-18: 2 mL

## 2022-07-18 MED ORDER — SODIUM CHLORIDE 0.9% FLUSH: 3.0000 mL | INTRAVENOUS | Status: DC | PRN

## 2022-07-18 MED ORDER — HEPARIN SODIUM (PORCINE) 1000 UNIT/ML IJ SOLN
INTRAMUSCULAR | Status: DC | PRN
  Administered 2022-07-18: 3500 [IU] via INTRAVENOUS

## 2022-07-18 MED ORDER — IOHEXOL 350 MG/ML SOLN
INTRAVENOUS | Status: DC | PRN
  Administered 2022-07-18: 85 mL

## 2022-07-18 MED ORDER — SODIUM CHLORIDE 0.9 % IV SOLN: INTRAVENOUS | Status: AC

## 2022-07-18 MED ORDER — SODIUM CHLORIDE 0.9 % WEIGHT BASED INFUSION: 1.0000 mL/kg/h | INTRAVENOUS | Status: DC

## 2022-07-18 MED ORDER — HYDRALAZINE HCL 20 MG/ML IJ SOLN: 10.0000 mg | INTRAMUSCULAR | Status: DC | PRN

## 2022-07-18 SURGICAL SUPPLY — 15 items
CATH 5FR JL3.5 JR4 ANG PIG MP (CATHETERS) IMPLANT
CATH SWAN GANZ 7F STRAIGHT (CATHETERS) IMPLANT
DEVICE RAD COMP TR BAND LRG (VASCULAR PRODUCTS) IMPLANT
GLIDESHEATH SLEND SS 6F .021 (SHEATH) IMPLANT
GLIDESHEATH SLENDER 7FR .021G (SHEATH) IMPLANT
GUIDEWIRE INQWIRE 1.5J.035X260 (WIRE) IMPLANT
INQWIRE 1.5J .035X260CM (WIRE) ×1
KIT HEART LEFT (KITS) ×1 IMPLANT
PACK CARDIAC CATHETERIZATION (CUSTOM PROCEDURE TRAY) ×1 IMPLANT
SHEATH PINNACLE 5F 10CM (SHEATH) IMPLANT
SHEATH PROBE COVER 6X72 (BAG) IMPLANT
SYR MEDRAD MARK 7 150ML (SYRINGE) ×1 IMPLANT
TRANSDUCER W/STOPCOCK (MISCELLANEOUS) ×1 IMPLANT
TUBING CIL FLEX 10 FLL-RA (TUBING) ×1 IMPLANT
WIRE HI TORQ VERSACORE-J 145CM (WIRE) IMPLANT

## 2022-07-18 NOTE — Interval H&P Note (Signed)
History and Physical Interval Note:  07/18/2022 9:27 AM  Cameron Patton  has presented today for surgery, with the diagnosis of mitral valve reg.  The various methods of treatment have been discussed with the patient and family. After consideration of risks, benefits and other options for treatment, the patient has consented to  Procedure(s): RIGHT/LEFT HEART CATH AND CORONARY ANGIOGRAPHY (N/A) as a surgical intervention.  The patient's history has been reviewed, patient examined, no change in status, stable for surgery.  I have reviewed the patient's chart and labs.  Questions were answered to the patient's satisfaction.    Planned diagnostic procedure   Lance Muss

## 2022-07-18 NOTE — Interval H&P Note (Signed)
History and Physical Interval Note:  07/18/2022 9:26 AM  Cameron Patton  has presented today for surgery, with the diagnosis of mitral valve reg.  The various methods of treatment have been discussed with the patient and family. After consideration of risks, benefits and other options for treatment, the patient has consented to  Procedure(s): RIGHT/LEFT HEART CATH AND CORONARY ANGIOGRAPHY (N/A) as a surgical intervention.  The patient's history has been reviewed, patient examined, no change in status, stable for surgery.  I have reviewed the patient's chart and labs.  Questions were answered to the patient's satisfaction.     Lance Muss

## 2022-07-18 NOTE — Telephone Encounter (Signed)
Received lab results from 12/5. In Epic. Please review.

## 2022-07-18 NOTE — Progress Notes (Signed)
Pt and sister in law received d/c instructions written and verbal. All questions and concerns addressed. PIV removed and intact. TR band in place, no s/s of bleeding or hematoma noted.

## 2022-07-18 NOTE — Telephone Encounter (Signed)
Left message for patient that dr hochrein has reviewed and no changes.

## 2022-07-18 NOTE — Progress Notes (Signed)
TR band removed, new dressing in place along w/ arm brace. No s/s of bleeding or hematoma noted. will continue to monitor.

## 2022-07-19 ENCOUNTER — Encounter (HOSPITAL_COMMUNITY): Payer: Self-pay | Admitting: Interventional Cardiology

## 2022-07-19 MED FILL — Fentanyl Citrate Preservative Free (PF) Inj 100 MCG/2ML: INTRAMUSCULAR | Qty: 2 | Status: AC

## 2022-07-22 NOTE — Progress Notes (Unsigned)
[address]       [place] [address]             [phone]           Danielle L Baxley [facility] Medical Record [id number] Date of Birth: 16-Sep-1945  Vivi Barrack, MD Vivi Barrack, MD  Chief Complaint: Chronic Afib and Severe MR  History of Present Illness:     Pt is a pleasant 76 yo wm who has had long standing afib since 1997. Pt has been rate controlled and on coumadin. Pt has been followed by Dr Percival Spanish and the past visit pt reported no increase in symptoms of SOB, PND, or palpitations. Pt had a repeat Echo that  however was concerning for severe MR that was confirmed with TEE. 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 He has severely dilated Left and right atria and moderate TR. His EF is 60-65%. Pt today reports that he loves to walk and also has been to the gym but not so much lately. We had a conversation over his alcohol intake and he reports another episode of a fall 3 weeks ago after leaving a bar was not like past fall where he broke his ribs. He reports that he is still working on decreasing his drinking down from 5 days a week to 3 and to stop at 3 beers a night. He reports no liver issues or bleeding problems.      Past Medical History:  Diagnosis Date   Allergic rhinitis    Atrial fibrillation ([place])    History of ETOH abuse    Hx of degenerative disc disease     Past Surgical History:  Procedure Laterality Date   ABDOMINAL AORTOGRAM N/A 07/18/2022   Procedure: ABDOMINAL AORTOGRAM;  Surgeon: Jettie Booze, MD;  Location: [facility];  Service: Cardiovascular;  Laterality: N/A;   BUBBLE STUDY  05/30/2022   Procedure: BUBBLE STUDY;  Surgeon: Geralynn Rile, MD;  Location: [facility];  Service: Cardiovascular;;   CATARACT EXTRACTION BILATERAL W/ ANTERIOR VITRECTOMY Bilateral 2015   INGUINAL HERNIA REPAIR Right 1982   RIGHT/LEFT HEART CATH AND CORONARY ANGIOGRAPHY N/A  07/18/2022   Procedure: RIGHT/LEFT HEART CATH AND CORONARY ANGIOGRAPHY;  Surgeon: Jettie Booze, MD;  Location: [facility];  Service: Cardiovascular;  Laterality: N/A;   TEE WITHOUT CARDIOVERSION N/A 05/30/2022   Procedure: TRANSESOPHAGEAL ECHOCARDIOGRAM (TEE);  Surgeon: Geralynn Rile, MD;  Location: [facility];  Service: Cardiovascular;  Laterality: N/A;    Social History   Tobacco Use  Smoking Status Never  Smokeless Tobacco Never    Social History   Substance and Sexual Activity  Alcohol Use Yes   Alcohol/week: 13.0 standard drinks of alcohol   Types: 8 Cans of beer, 5 Shots of liquor per week   Comment: Daily (4-5 beers, 1-2 shots - 4-5 days a week)    Social History   Socioeconomic History   Marital status: Single    Spouse name: Not on file   Number of children: 2   Years of education: Not on file   Highest education level: Not on file  Occupational History   Occupation: Retired   Tobacco Use   Smoking status: Never   Smokeless tobacco: Never  Vaping Use   Vaping Use: Never used  Substance and Sexual Activity   Alcohol use: Yes    Alcohol/week: 13.0 standard drinks of alcohol  Types: 8 Cans of beer, 5 Shots of liquor per week    Comment: Daily (4-5 beers, 1-2 shots - 4-5 days a week)   Drug use: Never   Sexual activity: Yes    Partners: Female  Other Topics Concern   Not on file  Social History Narrative   ** Merged History Encounter **       Relocated to [place] from [place] 08/2015 months ago after significant hospital stay with UTI leading to sepsis. Went to rehab for a few months afterwards. Sister in law only family in [place]  Son lives in [place] Daughter lives in [place]    (addiction counselor)    Social Determinants of Health   Financial Resource Strain: Low Risk  (11/03/2020)   Overall Financial Resource Strain (CARDIA)    Difficulty of Paying Living Expenses: Not hard at all  Food Insecurity: No Food  Insecurity (11/03/2020)   Hunger Vital Sign    Worried About Running Out of Food in the Last Year: Never true    Ran Out of Food in the Last Year: Never true  Transportation Needs: No Transportation Needs (11/03/2020)   PRAPARE - Administrator, Civil Service (Medical): No    Lack of Transportation (Non-Medical): No  Physical Activity: Insufficiently Active (11/03/2020)   Exercise Vital Sign    Days of Exercise per Week: 4 days    Minutes of Exercise per Session: 30 min  Stress: No Stress Concern Present (11/03/2020)   [employer] of Occupational Health - Occupational Stress Questionnaire    Feeling of Stress : Only a little  Social Connections: Socially Isolated (11/03/2020)   Social Connection and Isolation Panel [NHANES]    Frequency of Communication with Friends and Family: Twice a week    Frequency of Social Gatherings with Friends and Family: Once a week    Attends Religious Services: Never    Database administrator or Organizations: No    Attends Banker Meetings: Never    Marital Status: Never married  Intimate Partner Violence: Not At Risk (11/03/2020)   Humiliation, Afraid, Rape, and Kick questionnaire    Fear of Current or Ex-Partner: No    Emotionally Abused: No    Physically Abused: No    Sexually Abused: No    No Known Allergies  Current Outpatient Medications  Medication Sig Dispense Refill   B Complex-C (B-COMPLEX WITH VITAMIN C) tablet Take 1 tablet by mouth daily.     digoxin (LANOXIN) 0.125 MG tablet Take 1 tablet (0.125 mg total) by mouth daily. 90 tablet 2   ibuprofen (ADVIL) 200 MG tablet Take 200 mg by mouth every 8 (eight) hours as needed (pain.).     phenylephrine (SUDAFED PE) 10 MG TABS tablet Take 10 mg by mouth daily.     warfarin (COUMADIN) 3 MG tablet TAKE 1/2 TO 1 TABLET DAILY AS DIRECTED by Anticoagulation Clinic. (Patient taking differently: Take 1.5-3 mg by mouth See admin instructions. Take 0.5 tablets (1.5 mg) by mouth  on Sundays, Tuesdays, & Thursdays Take 1 tablet (3 mg) by mouth on Mondays, Wednesdays, Fridays & Saturdays.) 90 tablet 0   No current facility-administered medications for this visit.     Family History  Problem Relation Age of Onset   Alcoholism Mother        Died age 65   Stroke Brother    Cancer Brother        Liver/lung       Physical Exam: HEENT:  teeth in relatively good repair Lungs: clear Card: IRR with 3/6 sem Ext: no edema Neuro: slow affect     Diagnostic Studies & Laboratory data: I have personally reviewed the following studies and agree with the findings TEE (05/2022)    IMPRESSIONS   1. Severe mitral valve regurgitation. 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 Regurgitation jet is central in the P2-P3  distribution. Systolic blunting in all pulmonary  veins. 2D PISA radius 0.9 cm, 2D ERO 0.33 cm2, R vol 65 cc, RF 57%, 3D VCA  0.50 cm2 (oval shape). All quantitation consistent with severe MR. MG 1  mmHG @ 75 bpm. MVA 7.1 cm2 by direct 3D planimetry. PMVL length ~19 mm.  Favorable IAS for transeptal  puncture. Mild posterior annular calcium is present. The mitral valve is  myxomatous. Severe mitral valve regurgitation. No evidence of mitral  stenosis.   2. Left ventricular ejection fraction, by estimation, is 60 to 65%. The  left ventricle has normal function.   3. Right ventricular systolic function is normal. The right ventricular  size is moderately enlarged.   4. Left atrial size was severely dilated. No left atrial/left atrial  appendage thrombus was detected. The LAA emptying velocity was 25 cm/s.   5. Right atrial size was severely dilated.   6. Tricuspid valve regurgitation is mild to moderate.   7. The aortic valve is tricuspid. Aortic valve regurgitation is not  visualized. Aortic valve sclerosis/calcification is present, without any  evidence of aortic  stenosis.   8. There is Moderate (Grade III) protruding plaque involving the aortic  arch and descending aorta.   9. Agitated saline contrast bubble study was negative, with no evidence  of any interatrial shunt.   FINDINGS   Left Ventricle: Left ventricular ejection fraction, by estimation, is 60  to 65%. The left ventricle has normal function. The left ventricular  internal cavity size was normal in size.   Right Ventricle: The right ventricular size is moderately enlarged. No  increase in right ventricular wall thickness. Right ventricular systolic  function is normal.   Left Atrium: Left atrial size was severely dilated. Spontaneous echo  contrast was present in the left atrium. No left atrial/left atrial  appendage thrombus was detected. The LAA emptying velocity was 25 cm/s.   Right Atrium: Right atrial size was severely dilated.   Pericardium: There is no evidence of pericardial effusion.   Mitral Valve: Severe mitral valve regurgitation. There is bileaflet  prolapse (Barlow's valve) with some element of annular dilation due to  Aifb with severely dilated LA. Regurgitation jet is central in the P2-P3  distribution. Systolic blunting in all  pulmonary veins. 2D PISA radius 0.9 cm, 2D ERO 0.33 cm2, R vol 65 cc, RF  57%, 3D VCA 0.50 cm2 (oval shape). All quantitation consistent with severe  MR. MG 1 mmHG @ 75 bpm. MVA 7.1 cm2 by direct 3D planimetry. PMVL length  ~19 mm. Favorable IAS for  transeptal puncture. Mild posterior annular calcium is present. The mitral  valve is myxomatous. Severe mitral valve regurgitation. No evidence of  mitral valve stenosis. MV peak gradient, 3.9 mmHg. The mean mitral valve  gradient is 1.0 mmHg with average  heart rate of 75 bpm.   Tricuspid Valve: The tricuspid valve is grossly normal. Tricuspid valve  regurgitation is mild to moderate. No evidence of tricuspid stenosis.   Aortic Valve: The aortic valve is  tricuspid. Aortic valve  regurgitation is  not visualized. Aortic valve sclerosis/calcification is present, without  any evidence of aortic stenosis. Aortic valve mean gradient measures 4.0  mmHg. Aortic valve peak gradient  measures 8.9 mmHg. Aortic valve area, by VTI measures 1.83 cm.   Pulmonic Valve: The pulmonic valve was grossly normal. Pulmonic valve  regurgitation is trivial. No evidence of pulmonic stenosis.   Aorta: The aortic root and ascending aorta are structurally normal, with  no evidence of dilitation. There is moderate (Grade III) protruding plaque  involving the aortic arch and descending aorta.   Venous: A systolic blunting flow pattern is recorded from the left upper  pulmonary vein, the left lower pulmonary vein, the right upper pulmonary  vein and the right lower pulmonary vein.   IAS/Shunts: No atrial level shunt detected by color flow Doppler. Agitated  saline contrast was given intravenously to evaluate for intracardiac  shunting. Agitated saline contrast bubble study was negative, with no  evidence of any interatrial shunt.     LEFT VENTRICLE  PLAX 2D  LVOT diam:     2.10 cm  LV SV:         49  LV SV Index:   26  LVOT Area:     3.46 cm     AORTIC VALVE  AV Area (Vmax):    1.84 cm  AV Area (Vmean):   2.04 cm  AV Area (VTI):     1.83 cm  AV Vmax:           149.00 cm/s  AV Vmean:          91.700 cm/s  AV VTI:            0.270 m  AV Peak Grad:      8.9 mmHg  AV Mean Grad:      4.0 mmHg  LVOT Vmax:         79.36 cm/s  LVOT Vmean:        54.136 cm/s  LVOT VTI:          0.143 m  LVOT/AV VTI ratio: 0.53    AORTA  Ao Root diam: 2.90 cm  Ao Asc diam:  3.34 cm   MITRAL VALVE                  TRICUSPID VALVE  MV Peak grad: 3.9 mmHg        TR Peak grad:   31.1 mmHg  MV Mean grad: 1.0 mmHg        TR Vmax:        279.00 cm/s  MV Vmax:      0.99 m/s  MV Vmean:     46.2 cm/s       SHUNTS  MR Peak grad:    134.6 mmHg   Systemic VTI:  0.14 m  MR Mean grad:    90.0 mmHg     Systemic Diam: 2.10 cm  MR Vmax:         580.00 cm/s  MR Vmean:        450.0 cm/s  MR PISA:         5.09 cm  MR PISA Eff ROA: 33 mm  MR PISA Radius:  0.90 cm   Cath (07/18/2022) Conclusion   Mid LAD lesion is 40% stenosed.   LV end diastolic pressure is mildly elevated.   Hemodynamic findings consistent with mild pulmonary hypertension.   There is no aortic valve stenosis.   Aortic  saturation 97%, PA saturation 65%, mean RA pressure 11 mmHg, PA pressure 46/20, mean PA pressure 34 mmHg, pulmonary capillary wedge pressure 23/31, mean pulmonary capillary wedge pressure 21 mmHg, significant V waves noted, cardiac output 4.68 L/min, cardiac index 2.44.   Heavily calcified right radial artery with difficulty removing as noted in the procedural narrative.  Although the catheterization could be completed from the right radial approach, would rec and a different Roche if future Authorization was needed.   No abdominal aortic aneurysm.  Very tortuous right common iliac artery.   Mild, nonobstructive coronary artery disease.  No abdominal aortic aneurysm.  Tortuous right common iliac.   Recent Radiology Findings:   No results found.    Recent Lab Findings: Lab Results  Component Value Date   WBC 6.2 07/17/2022   HGB 11.2 (L) 07/18/2022   HCT 33.0 (L) 07/18/2022   PLT 192 07/17/2022   GLUCOSE 148 (H) 07/17/2022   CHOL 173 12/05/2021   TRIG 71.0 12/05/2021   HDL 69.60 12/05/2021   LDLCALC 89 12/05/2021   ALT 23 06/22/2022   AST 39 06/22/2022   NA 134 (L) 07/18/2022   K 3.9 07/18/2022   CL 95 (L) 07/17/2022   CREATININE 0.78 07/17/2022   BUN 17 07/17/2022   CO2 28 07/17/2022   TSH 2.68 12/05/2021   INR 1.4 (H) 07/18/2022   HGBA1C 5.6 12/05/2021      Assessment / Plan:     Severe MR Pt with relatively few symptoms and EF > 60%. He has however severe MR and in time will soon be having issues from his MR. As an operative candidate, I am quite concerned about with his alcohol  issues, that it would be a higher risk situation and on discussing the surgery and recovery with the patient, he also agrees that he would not prefer to have surgery (Mitral valve repair/possible TV repair, closure of LA appendage). I would recommend having the mitral clip procedure perused initially and if not a candidate for the mitral clip, to reevaluate his surgical candidacy on the provision that his alcohol consumption be limited as he says he will accomplish.  I have spent 60 min in review of the records, viewing studies and in face to face with patient and in coordination of future care    Coralie Common 07/22/2022 11:56 AM

## 2022-07-23 ENCOUNTER — Institutional Professional Consult (permissible substitution): Payer: Medicare Other | Admitting: Thoracic Surgery (Cardiothoracic Vascular Surgery)

## 2022-07-23 ENCOUNTER — Encounter: Payer: Self-pay | Admitting: Thoracic Surgery (Cardiothoracic Vascular Surgery)

## 2022-07-23 VITALS — BP 130/70 | HR 88 | Resp 20 | Ht 69.0 in | Wt 166.0 lb

## 2022-07-23 DIAGNOSIS — I340 Nonrheumatic mitral (valve) insufficiency: Secondary | ICD-10-CM

## 2022-07-23 DIAGNOSIS — I4891 Unspecified atrial fibrillation: Secondary | ICD-10-CM | POA: Diagnosis not present

## 2022-07-23 NOTE — Patient Instructions (Signed)
Have assessed for mitral clip Decrease alcohol consumption. If not a candidate for mitral clip to reconsider surgery after controls alcohol consumption.

## 2022-07-25 ENCOUNTER — Ambulatory Visit: Payer: Medicare Other | Attending: Cardiovascular Disease

## 2022-07-25 DIAGNOSIS — I4891 Unspecified atrial fibrillation: Secondary | ICD-10-CM | POA: Diagnosis not present

## 2022-07-25 DIAGNOSIS — Z7901 Long term (current) use of anticoagulants: Secondary | ICD-10-CM

## 2022-07-25 LAB — POCT INR: INR: 2.1 (ref 2.0–3.0)

## 2022-07-25 NOTE — Patient Instructions (Addendum)
Description   Continue taking warfarin 1 tablet daily except 1/2 tablet on Sundays, Tuesdays and Thursdays. Repeat INR in 4 weeks.  Anticoagulation Clinic [phone]

## 2022-07-26 ENCOUNTER — Telehealth: Payer: Self-pay

## 2022-07-26 ENCOUNTER — Encounter: Payer: Self-pay | Admitting: *Deleted

## 2022-07-26 NOTE — Telephone Encounter (Signed)
Per [facility] review of 05/30/22 TEE:  "For patient Cameron Patton: This is secondary MR    This looks like a suitable valve for a MitraClip. The fossa looks approachable for transseptal puncture in the SAXB and Bicaval views. LA dimensions are large enough for device steering and straddle. The MR is caused by a large LA, dilated annulus & lack of central coaptation.  Posterior leaflet measures 1.2cm in the LVOT views.  MVA measures 6.4cm2, gradient not measured. I'd like to verify both in the procedure prior to the start.  Based on this information, I would start with a XTW placed just medial of A2/P2 & reassess for gradient & residual."

## 2022-08-23 ENCOUNTER — Ambulatory Visit: Payer: Medicare Other

## 2022-08-23 ENCOUNTER — Encounter: Payer: Self-pay | Admitting: Internal Medicine

## 2022-08-23 ENCOUNTER — Ambulatory Visit: Payer: Medicare Other | Attending: Internal Medicine | Admitting: Internal Medicine

## 2022-08-23 VITALS — BP 105/68 | HR 47 | Ht 69.0 in | Wt 163.6 lb

## 2022-08-23 DIAGNOSIS — I4811 Longstanding persistent atrial fibrillation: Secondary | ICD-10-CM | POA: Diagnosis not present

## 2022-08-23 DIAGNOSIS — I340 Nonrheumatic mitral (valve) insufficiency: Secondary | ICD-10-CM

## 2022-08-23 DIAGNOSIS — F1010 Alcohol abuse, uncomplicated: Secondary | ICD-10-CM | POA: Diagnosis not present

## 2022-08-23 DIAGNOSIS — I361 Nonrheumatic tricuspid (valve) insufficiency: Secondary | ICD-10-CM | POA: Diagnosis not present

## 2022-08-23 MED ORDER — APIXABAN 5 MG PO TABS: 5.0000 mg | ORAL_TABLET | Freq: Two times a day (BID) | ORAL | 5 refills | Status: DC

## 2022-08-23 NOTE — Progress Notes (Signed)
Patient ID: Cameron Patton MRN: [id number] DOB/AGE: 03-03-1946 77 y.o.  Primary Care Physician:Parker, Algis Greenhouse, MD Primary Cardiologist: Arley Phenix, MD Referring physician: Coralie Common, MD   FOCUSED CARDIOVASCULAR PROBLEM LIST:   1.  Atrial fibrillation on Coumadin 2.  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 3.  Mild to moderate tricuspid regurgitation 4.  Alcohol abuse   HISTORY OF PRESENT ILLNESS: The patient is a 77 y.o. male with the indicated medical history here for recommendations regarding his probably valvular disease.  The patient has been followed by Dr. Percival Spanish for several years.  A recent echocardiogram done last year demonstrated severe mitral regurgitation.  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  He also has mild to moderate tricuspid regurgitation.  Patient was seen by Dr. Lavonna Monarch who was considering a mitral and tricuspid valve repair as well as a left atrial appendage ligation procedure.  However he had concerns about the patient's recovery and doing well long-term after complex surgical repair given his very significant history of alcohol abuse.  This is a significant issue so much so that the patient has now started taking the bus to go to bars because he has had 2 or 3 DUIs.  He is also blacked out or passed out due to excessive alcohol intake at times.  Fortunately has not hit his head.  On review of his INR as he has been subtherapeutic on a number of occasions.  In terms of symptoms the patient tells me he is not really short of breath.  When pressed more he tells me when he goes on walks he is walking 2 dogs and he really starts and stops.  He says he can go up stairs without any issues.  He denies any paroxysmal nocturnal dyspnea, orthopnea, presyncope, syncope not related to alcohol use, or  exertional chest pain.  He does see a dentist on a regular basis and tells me that he needs some dental work done for an eroding tooth.  Past Medical History:  Diagnosis Date   Allergic rhinitis    Atrial fibrillation ([place])    History of ETOH abuse    Hx of degenerative disc disease     Past Surgical History:  Procedure Laterality Date   ABDOMINAL AORTOGRAM N/A 07/18/2022   Procedure: ABDOMINAL AORTOGRAM;  Surgeon: Jettie Booze, MD;  Location: [facility];  Service: Cardiovascular;  Laterality: N/A;   BUBBLE STUDY  05/30/2022   Procedure: BUBBLE STUDY;  Surgeon: Geralynn Rile, MD;  Location: [facility];  Service: Cardiovascular;;   CATARACT EXTRACTION BILATERAL W/ ANTERIOR VITRECTOMY Bilateral 2015   INGUINAL HERNIA REPAIR Right 1982   RIGHT/LEFT HEART CATH AND CORONARY ANGIOGRAPHY N/A 07/18/2022   Procedure: RIGHT/LEFT HEART CATH AND CORONARY ANGIOGRAPHY;  Surgeon: Jettie Booze, MD;  Location: [facility];  Service: Cardiovascular;  Laterality: N/A;   TEE WITHOUT CARDIOVERSION N/A 05/30/2022   Procedure: TRANSESOPHAGEAL ECHOCARDIOGRAM (TEE);  Surgeon: Geralynn Rile, MD;  Location: [facility];  Service: Cardiovascular;  Laterality: N/A;    Family History  Problem Relation Age of Onset   Alcoholism Mother        Died age 30   Stroke Brother    Cancer Brother        Liver/lung    Social History   Socioeconomic History   Marital status: Single  Spouse name: Not on file   Number of children: 2   Years of education: Not on file   Highest education level: Not on file  Occupational History   Occupation: Retired   Tobacco Use   Smoking status: Never   Smokeless tobacco: Never  Vaping Use   Vaping Use: Never used  Substance and Sexual Activity   Alcohol use: Yes    Alcohol/week: 13.0 standard drinks of alcohol    Types: 8 Cans of beer, 5 Shots of liquor per week    Comment: Daily (4-5 beers, 1-2 shots - 4-5 days a week)    Drug use: Never   Sexual activity: Yes    Partners: Female  Other Topics Concern   Not on file  Social History Narrative   ** Merged History Encounter **       Relocated to [place] from [place] 08/2015 months ago after significant hospital stay with UTI leading to sepsis. Went to rehab for a few months afterwards. Sister in law only family in [place]  Son lives in [place] Daughter lives in [place]    (addiction counselor)    Social Determinants of Health   Financial Resource Strain: Low Risk  (11/03/2020)   Overall Financial Resource Strain (CARDIA)    Difficulty of Paying Living Expenses: Not hard at all  Food Insecurity: No Food Insecurity (11/03/2020)   Hunger Vital Sign    Worried About Running Out of Food in the Last Year: Never true    Ran Out of Food in the Last Year: Never true  Transportation Needs: No Transportation Needs (11/03/2020)   PRAPARE - Administrator, Civil Service (Medical): No    Lack of Transportation (Non-Medical): No  Physical Activity: Insufficiently Active (11/03/2020)   Exercise Vital Sign    Days of Exercise per Week: 4 days    Minutes of Exercise per Session: 30 min  Stress: No Stress Concern Present (11/03/2020)   [employer] of Occupational Health - Occupational Stress Questionnaire    Feeling of Stress : Only a little  Social Connections: Socially Isolated (11/03/2020)   Social Connection and Isolation Panel [NHANES]    Frequency of Communication with Friends and Family: Twice a week    Frequency of Social Gatherings with Friends and Family: Once a week    Attends Religious Services: Never    Database administrator or Organizations: No    Attends Banker Meetings: Never    Marital Status: Never married  Intimate Partner Violence: Not At Risk (11/03/2020)   Humiliation, Afraid, Rape, and Kick questionnaire    Fear of Current or Ex-Partner: No    Emotionally Abused: No    Physically Abused: No    Sexually  Abused: No     Prior to Admission medications   Medication Sig Start Date End Date Taking? Authorizing Provider  B Complex-C (B-COMPLEX WITH VITAMIN C) tablet Take 1 tablet by mouth daily.    [provider]  digoxin (LANOXIN) 0.125 MG tablet Take 1 tablet (0.125 mg total) by mouth daily. 04/30/22   Rollene Rotunda, MD  ibuprofen (ADVIL) 200 MG tablet Take 200 mg by mouth every 8 (eight) hours as needed (pain.).    [provider]  phenylephrine (SUDAFED PE) 10 MG TABS tablet Take 10 mg by mouth daily.    [provider]  warfarin (COUMADIN) 3 MG tablet TAKE 1/2 TO 1 TABLET DAILY AS DIRECTED by Anticoagulation Clinic. Patient taking differently: Take 1.5-3 mg by  mouth See admin instructions. Take 0.5 tablets (1.5 mg) by mouth on Sundays, Tuesdays, & Thursdays Take 1 tablet (3 mg) by mouth on Mondays, Wednesdays, Fridays & Saturdays. 05/16/22   Rollene Rotunda, MD    No Known Allergies  REVIEW OF SYSTEMS:  General: no fevers/chills/night sweats Eyes: no blurry vision, diplopia, or amaurosis ENT: no sore throat or hearing loss Resp: no cough, wheezing, or hemoptysis CV: no edema or palpitations GI: no abdominal pain, nausea, vomiting, diarrhea, or constipation GU: no dysuria, frequency, or hematuria Skin: no rash Neuro: no headache, numbness, tingling, or weakness of extremities Musculoskeletal: no joint pain or swelling Heme: no bleeding, DVT, or easy bruising Endo: no polydipsia or polyuria  BP 105/68   Pulse (!) 47   Ht 5\' 9"  (1.753 m)   Wt 163 lb 9.6 oz (74.2 kg)   SpO2 99%   BMI 24.16 kg/m   PHYSICAL EXAM: GEN:  AO x 3 in no acute distress HEENT: normal Dentition: Normal Neck: JVP normal. +2 carotid upstrokes without bruits. No thyromegaly. Lungs: equal expansion, clear bilaterally CV: Apex is discrete and nondisplaced, regular rate and rhythm with 2 out of 6 holosystolic murmur best heard at the left sternum Abd: soft, non-tender,  non-distended; no bruit; positive bowel sounds Ext: no edema, ecchymoses, or cyanosis Vascular: 2+ femoral pulses, 2+ radial pulses       Skin: warm and dry without rash Neuro: CN II-XII grossly intact; motor and sensory grossly intact    DATA AND STUDIES:  EKG: November 2023 atrial fibrillation with a controlled ventricular rate  2D ECHO: September 2023  1. Billowing mitral valve. Centrally directed MR jet likely atrial  functional given normal appearance of leaflets with central jet. Severe  mitral regurgitation is present. The mitral valve is grossly normal.  Severe mitral valve regurgitation. No  evidence of mitral stenosis.   2. Left ventricular ejection fraction, by estimation, is 60 to 65%. The  left ventricle has normal function. The left ventricle has no regional  wall motion abnormalities. There is moderate concentric left ventricular  hypertrophy. Left ventricular  diastolic function could not be evaluated.   3. Right ventricular systolic function is normal. The right ventricular  size is moderately enlarged. There is mildly elevated pulmonary artery  systolic pressure. The estimated right ventricular systolic pressure is  44.4 mmHg.   4. Left atrial size was severely dilated.   5. Right atrial size was severely dilated.   6. Tricuspid valve regurgitation is mild to moderate.   7. The aortic valve is tricuspid. Aortic valve regurgitation is not  visualized. No aortic stenosis is present.   8. The inferior vena cava is dilated in size with <50% respiratory  variability, suggesting right atrial pressure of 15 mmHg.   9. Severe biatrial enlargement.   TEE:  October 2023  1. Severe mitral valve regurgitation. 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 Regurgitation jet is central in the P2-P3  distribution. Systolic blunting in all pulmonary  veins. 2D PISA radius 0.9 cm, 2D ERO 0.33 cm2, R  vol 65 cc, RF 57%, 3D VCA  0.50 cm2 (oval shape). All quantitation consistent with severe MR. MG 1  mmHG @ 75 bpm. MVA 7.1 cm2 by direct 3D planimetry. PMVL length ~19 mm.  Favorable IAS for transeptal  puncture. Mild posterior annular calcium is present. The mitral valve is  myxomatous. Severe mitral valve regurgitation. No evidence  of mitral  stenosis.   2. Left ventricular ejection fraction, by estimation, is 60 to 65%. The  left ventricle has normal function.   3. Right ventricular systolic function is normal. The right ventricular  size is moderately enlarged.   4. Left atrial size was severely dilated. No left atrial/left atrial  appendage thrombus was detected. The LAA emptying velocity was 25 cm/s.   5. Right atrial size was severely dilated.   6. Tricuspid valve regurgitation is mild to moderate.   7. The aortic valve is tricuspid. Aortic valve regurgitation is not  visualized. Aortic valve sclerosis/calcification is present, without any  evidence of aortic stenosis.   8. There is Moderate (Grade III) protruding plaque involving the aortic  arch and descending aorta.   9. Agitated saline contrast bubble study was negative, with no evidence  of any interatrial shunt.   CARDIAC CATH: December 2023 demonstrates minimal obstructive coronary artery disease with a cardiac output of 4.68 L/min and index of 2.44 L/min/m with a wedge pressure of 21 mmHg and V waves to 31 mmHg  STS RISK CALCULATOR: pending  NHYA CLASS: 1    ASSESSMENT AND PLAN:   Severe mitral regurgitation  Longstanding persistent atrial fibrillation (HCC)  Nonrheumatic tricuspid valve regurgitation  Alcohol abuse  The patient has developed severe mitral regurgitation that looks to be from bileaflet prolapse.  He does have a large left atrium however as well.  I had a long talk with the patient about his treatment options.  Given his longstanding and significant history of alcohol abuse he is not thought to  be an optimal surgical candidate however Dr. Lavonna Monarch is willing to pursue this if necessary and the patient can curb his alcohol intake.  Right now the patient is asymptomatic but I not sure whether he is modulating his activity.  I will check a BNP and refer the patient for an exercise treadmill stress test.  It looks like his valve is amenable to mitral transcatheter edge-to-edge repair.  In preparation for this or either a surgical procedure I have asked the patient to contact his dentist office and have his dental work performed.  Additionally given subtherapeutic INR's on Coumadin I will discontinue the patient's Coumadin and started him on Eliquis 5 mg twice daily.  I will also review the patient's TEE with our structural heart imagers but again it looks amenable to mitral transcatheter edge-to-edge repair.  Certainly with mitral transcatheter edge-to-edge repair with a Barlow's valve can be unpredictable and we could get a suboptimal and unacceptable results.  I will speak with Dr. Lavonna Monarch about this possibility.  I also talked at great length about this with the patient.  However I think it does make sense in this particular patient to consider mitral transcatheter edge-to-edge repair initially.  I have personally reviewed the patients imaging data as summarized above.  I have reviewed the natural history of mitral regurgitation with the patient and family members who are present today. We have discussed the limitations of medical therapy and the poor prognosis associated with symptomatic mitral regurgitation. We have also reviewed potential treatment options, including palliative medical therapy, conventional mitral surgery, and transcatheter mitral edge-to-edge repair. We discussed treatment options in the context of this patient's specific comorbid medical conditions.   All of the patient's questions were answered today. Will make further recommendations based on the results of studies outlined  above.   Total time spent with patient today 60 minutes. This includes reviewing records, evaluating the patient  and coordinating care.   Orbie Pyo, MD  08/23/2022 10:28 AM    [facility] [address] [place], [place], [place]  [address] Phone: [phone]; Fax: [phone]

## 2022-08-23 NOTE — Patient Instructions (Addendum)
Medication Instructions:  Your physician has recommended you make the following change in your medication:  1.) stop warfarin 2.) ON SATURDAY 08/25/22 - start apixaban (Eliquis) 5 mg - one tablet twice a day  *If you need a refill on your cardiac medications before your next appointment, please call your pharmacy*   Lab Work: Today: BNP  If you have labs (blood work) drawn today and your tests are completely normal, you will receive your results only by: [place] (if you have MyChart) OR A paper copy in the mail If you have any lab test that is abnormal or we need to change your treatment, we will call you to review the results.   Testing/Procedures: Your physician has requested that you have an exercise tolerance test. For further information please visit [email or web address]. Please also follow instruction sheet, as given.   Follow-Up: Per Structural Heart Team

## 2022-08-24 LAB — PRO B NATRIURETIC PEPTIDE: NT-Pro BNP: 950 pg/mL — ABNORMAL HIGH (ref 0–486)

## 2022-08-27 ENCOUNTER — Telehealth: Payer: Self-pay | Admitting: Cardiology

## 2022-08-27 NOTE — Telephone Encounter (Signed)
   Patient Name: Cameron Patton  DOB: 05-09-1946 MRN: [id number]  Primary Cardiologist: Minus Breeding, MD  Chart reviewed as part of pre-operative protocol coverage.  Simple dental extractions (i.e. 1-2 teeth) are considered low risk procedures per guidelines and generally do not require any specific cardiac clearance. It is also generally accepted that for simple extractions and dental cleanings, there is no need to interrupt blood thinner therapy.   SBE prophylaxis is not required for the patient from a cardiac standpoint.  I will route this recommendation to the requesting party via Epic fax function and remove from pre-op pool.  Please call with questions.  Mable Fill, Marissa Nestle, NP 08/27/2022, 3:37 PM

## 2022-08-27 NOTE — Telephone Encounter (Signed)
   Pre-operative Risk Assessment    Patient Name: Cameron Patton  DOB: 1946/06/02 MRN: [id number]     Request for Surgical Clearance    Procedure:  Dental Extraction - Amount of Teeth to be Pulled:  1 - Tooth #15 and filling on Tooth #20  Date of Surgery:  Clearance TBD                                 Surgeon:  Dr. Posey Pronto Surgeon's Group or Practice Name:   [facility] Phone number:  [phone]  Fax number:  [phone]   Type of Clearance Requested:   - Medical  - Pharmacy:  Hold Apixaban (Eliquis)     Type of Anesthesia:   Articaine with 4%   Additional requests/questions:   Caller stated they would also need to know if the patient would need to be treated with antibiotics prior to the procedure.  Signed, Heloise Beecham   08/27/2022, 3:29 PM

## 2022-08-28 ENCOUNTER — Ambulatory Visit: Payer: Medicare Other | Attending: Internal Medicine

## 2022-08-28 DIAGNOSIS — I361 Nonrheumatic tricuspid (valve) insufficiency: Secondary | ICD-10-CM

## 2022-08-28 DIAGNOSIS — I340 Nonrheumatic mitral (valve) insufficiency: Secondary | ICD-10-CM

## 2022-08-29 LAB — EXERCISE TOLERANCE TEST
Angina Index: 0
Duke Treadmill Score: 5
Estimated workload: 6.4
Exercise duration (min): 4 min
Exercise duration (sec): 31 s
MPHR: 142 {beats}/min
Peak HR: 171 {beats}/min
Percent HR: 120 %
RPE: 15
Rest HR: 60 {beats}/min
ST Depression (mm): 0 mm

## 2022-08-31 NOTE — Progress Notes (Addendum)
Patient ID: Cameron Patton MRN: [id number] DOB/AGE: 1946-04-09 77 y.o.  Primary Care Physician:Parker, Katina Degree, MD Primary Cardiologist: Sarina Ser, MD Referring physician: Eugenio Hoes, MD   FOCUSED CARDIOVASCULAR PROBLEM LIST:   1.  Atrial fibrillation on Eliquis 2.  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 3.  Mild to moderate tricuspid regurgitation 4.  Alcohol abuse 5.  Aortic atherosclerosis on CT A/P 2021  HISTORY OF PRESENT ILLNESS:  08/23/22 consultation:  The patient is a 77 y.o. male with the indicated medical history here for recommendations regarding his probably valvular disease.  The patient has been followed by Dr. Antoine Poche for several years.  A recent echocardiogram done last year demonstrated severe mitral regurgitation.  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  He also has mild to moderate tricuspid regurgitation.  Patient was seen by Dr. Leafy Ro who was considering a mitral and tricuspid valve repair as well as a left atrial appendage ligation procedure.  However he had concerns about the patient's recovery and doing well long-term after complex surgical repair given his very significant history of alcohol abuse.  This is a significant issue so much so that the patient has now started taking the bus to go to bars because he has had 2 or 3 DUIs.  He is also blacked out or passed out due to excessive alcohol intake at times.  Fortunately has not hit his head.  On review of his INR as he has been subtherapeutic on a number of occasions.  In terms of symptoms the patient tells me he is not really short of breath.  When pressed more he tells me when he goes on walks he is walking 2 dogs and he really starts and stops.  He says he can go up stairs without any issues.  He denies any paroxysmal nocturnal dyspnea,  orthopnea, presyncope, syncope not related to alcohol use, or exertional chest pain.  He does see a dentist on a regular basis and tells me that he needs some dental work done for an eroding tooth.  Plan:  ETT and check BNP.  Today: The patient returns for follow-up after his exercise treadmill test.  He was unable to perform to age-appropriate norms.  Additionally his BNP was elevated.  He tells me that he is relatively unchanged versus last time I saw him.  On his own account he does not do much in a day.  In fact he told me that he had a long night on Monday night and he was at the bar till 2 AM and he slept till about 3 PM the next day.  On further query he tells me that he was able to walk with his dog when he was visiting family in [place] but again he had to stop many times because the dog wanted to stop.  He denies any severe bleeding or bruising while on Eliquis.  He has had no issues with this medication aside from missing 1 dose few days ago.  He denies any paroxysmal nocturnal dyspnea or orthopnea.  He did see his dentist and does not need dental work done to extract an eroding tooth.  This is scheduled for February 1.  Past Medical History:  Diagnosis Date   Allergic rhinitis    Atrial fibrillation (HCC)    History of ETOH abuse    Hx of degenerative disc  disease     Past Surgical History:  Procedure Laterality Date   ABDOMINAL AORTOGRAM N/A 07/18/2022   Procedure: ABDOMINAL AORTOGRAM;  Surgeon: Jettie Booze, MD;  Location: [facility];  Service: Cardiovascular;  Laterality: N/A;   BUBBLE STUDY  05/30/2022   Procedure: BUBBLE STUDY;  Surgeon: Geralynn Rile, MD;  Location: [facility];  Service: Cardiovascular;;   CATARACT EXTRACTION BILATERAL W/ ANTERIOR VITRECTOMY Bilateral 2015   INGUINAL HERNIA REPAIR Right 1982   RIGHT/LEFT HEART CATH AND CORONARY ANGIOGRAPHY N/A 07/18/2022   Procedure: RIGHT/LEFT HEART CATH AND CORONARY ANGIOGRAPHY;  Surgeon:  Jettie Booze, MD;  Location: [facility];  Service: Cardiovascular;  Laterality: N/A;   TEE WITHOUT CARDIOVERSION N/A 05/30/2022   Procedure: TRANSESOPHAGEAL ECHOCARDIOGRAM (TEE);  Surgeon: Geralynn Rile, MD;  Location: [facility];  Service: Cardiovascular;  Laterality: N/A;    Family History  Problem Relation Age of Onset   Alcoholism Mother        Died age 86   Stroke Brother    Cancer Brother        Liver/lung    Social History   Socioeconomic History   Marital status: Single    Spouse name: Not on file   Number of children: 2   Years of education: Not on file   Highest education level: Not on file  Occupational History   Occupation: Retired   Tobacco Use   Smoking status: Never   Smokeless tobacco: Never  Vaping Use   Vaping Use: Never used  Substance and Sexual Activity   Alcohol use: Yes    Alcohol/week: 13.0 standard drinks of alcohol    Types: 8 Cans of beer, 5 Shots of liquor per week    Comment: Daily (4-5 beers, 1-2 shots - 4-5 days a week)   Drug use: Never   Sexual activity: Yes    Partners: Female  Other Topics Concern   Not on file  Social History Narrative   ** Merged History Encounter **       Relocated to [place] from [place] 08/2015 months ago after significant hospital stay with UTI leading to sepsis. Went to rehab for a few months afterwards. Sister in law only family in [place]  Son lives in [place] Daughter lives in [place]    (addiction counselor)    Social Determinants of Health   Financial Resource Strain: Low Risk  (11/03/2020)   Overall Financial Resource Strain (CARDIA)    Difficulty of Paying Living Expenses: Not hard at all  Food Insecurity: No Food Insecurity (11/03/2020)   Hunger Vital Sign    Worried About Running Out of Food in the Last Year: Never true    [place] in the Last Year: Never true  Transportation Needs: No Transportation Needs (11/03/2020)   PRAPARE - Civil engineer, contracting (Medical): No    Lack of Transportation (Non-Medical): No  Physical Activity: Insufficiently Active (11/03/2020)   Exercise Vital Sign    Days of Exercise per Week: 4 days    Minutes of Exercise per Session: 30 min  Stress: No Stress Concern Present (11/03/2020)   [place]    Feeling of Stress : Only a little  Social Connections: Socially Isolated (11/03/2020)   Social Connection and Isolation Panel [NHANES]    Frequency of Communication with Friends and Family: Twice a week    Frequency of Social Gatherings with Friends and Family:  Once a week    Attends Religious Services: Never    Active Member of Clubs or Organizations: No    Attends Banker Meetings: Never    Marital Status: Never married  Intimate Partner Violence: Not At Risk (11/03/2020)   Humiliation, Afraid, Rape, and Kick questionnaire    Fear of Current or Ex-Partner: No    Emotionally Abused: No    Physically Abused: No    Sexually Abused: No     Prior to Admission medications   Medication Sig Start Date End Date Taking? Authorizing Provider  B Complex-C (B-COMPLEX WITH VITAMIN C) tablet Take 1 tablet by mouth daily.    [provider]  digoxin (LANOXIN) 0.125 MG tablet Take 1 tablet (0.125 mg total) by mouth daily. 04/30/22   Rollene Rotunda, MD  ibuprofen (ADVIL) 200 MG tablet Take 200 mg by mouth every 8 (eight) hours as needed (pain.).    [provider]  phenylephrine (SUDAFED PE) 10 MG TABS tablet Take 10 mg by mouth daily.    [provider]  warfarin (COUMADIN) 3 MG tablet TAKE 1/2 TO 1 TABLET DAILY AS DIRECTED by Anticoagulation Clinic. Patient taking differently: Take 1.5-3 mg by mouth See admin instructions. Take 0.5 tablets (1.5 mg) by mouth on Sundays, Tuesdays, & Thursdays Take 1 tablet (3 mg) by mouth on Mondays, Wednesdays, Fridays & Saturdays. 05/16/22   Rollene Rotunda, MD    No  Known Allergies  REVIEW OF SYSTEMS:  General: no fevers/chills/night sweats Eyes: no blurry vision, diplopia, or amaurosis ENT: no sore throat or hearing loss Resp: no cough, wheezing, or hemoptysis CV: no edema or palpitations GI: no abdominal pain, nausea, vomiting, diarrhea, or constipation GU: no dysuria, frequency, or hematuria Skin: no rash Neuro: no headache, numbness, tingling, or weakness of extremities Musculoskeletal: no joint pain or swelling Heme: no bleeding, DVT, or easy bruising Endo: no polydipsia or polyuria  BP 132/60   Pulse 73   Ht 5\' 9"  (1.753 m)   Wt 165 lb 6.4 oz (75 kg)   SpO2 100%   BMI 24.43 kg/m   PHYSICAL EXAM: GEN:  AO x 3 in no acute distress HEENT: normal Dentition: Normal Neck: JVP normal. +2 carotid upstrokes without bruits. No thyromegaly. Lungs: equal expansion, clear bilaterally CV: Apex is discrete and nondisplaced, regular rate and rhythm with 2 out of 6 holosystolic murmur best heard at the left sternum Abd: soft, non-tender, non-distended; no bruit; positive bowel sounds Ext: no edema, ecchymoses, or cyanosis Vascular: 2+ femoral pulses, 2+ radial pulses       Skin: warm and dry without rash Neuro: CN II-XII grossly intact; motor and sensory grossly intact   DATA AND STUDIES:  EKG: January 2024 atrial fibrillation with occasional PVCs  2D ECHO: September 2023  1. Billowing mitral valve. Centrally directed MR jet likely atrial  functional given normal appearance of leaflets with central jet. Severe  mitral regurgitation is present. The mitral valve is grossly normal.  Severe mitral valve regurgitation. No  evidence of mitral stenosis.   2. Left ventricular ejection fraction, by estimation, is 60 to 65%. The  left ventricle has normal function. The left ventricle has no regional  wall motion abnormalities. There is moderate concentric left ventricular  hypertrophy. Left ventricular  diastolic function could not be evaluated.    3. Right ventricular systolic function is normal. The right ventricular  size is moderately enlarged. There is mildly elevated pulmonary artery  systolic pressure. The estimated right ventricular  systolic pressure is  35.3 mmHg.   4. Left atrial size was severely dilated.   5. Right atrial size was severely dilated.   6. Tricuspid valve regurgitation is mild to moderate.   7. The aortic valve is tricuspid. Aortic valve regurgitation is not  visualized. No aortic stenosis is present.   8. The inferior vena cava is dilated in size with <50% respiratory  variability, suggesting right atrial pressure of 15 mmHg.   9. Severe biatrial enlargement.   TEE:  October 2023  1. Severe mitral valve regurgitation. 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 Regurgitation jet is central in the P2-P3  distribution. Systolic blunting in all pulmonary  veins. 2D PISA radius 0.9 cm, 2D ERO 0.33 cm2, R vol 65 cc, RF 57%, 3D VCA  0.50 cm2 (oval shape). All quantitation consistent with severe MR. MG 1  mmHG @ 75 bpm. MVA 7.1 cm2 by direct 3D planimetry. PMVL length ~19 mm.  Favorable IAS for transeptal  puncture. Mild posterior annular calcium is present. The mitral valve is  myxomatous. Severe mitral valve regurgitation. No evidence of mitral  stenosis.   2. Left ventricular ejection fraction, by estimation, is 60 to 65%. The  left ventricle has normal function.   3. Right ventricular systolic function is normal. The right ventricular  size is moderately enlarged.   4. Left atrial size was severely dilated. No left atrial/left atrial  appendage thrombus was detected. The LAA emptying velocity was 25 cm/s.   5. Right atrial size was severely dilated.   6. Tricuspid valve regurgitation is mild to moderate.   7. The aortic valve is tricuspid. Aortic valve regurgitation is not  visualized. Aortic valve sclerosis/calcification  is present, without any  evidence of aortic stenosis.   8. There is Moderate (Grade III) protruding plaque involving the aortic  arch and descending aorta.   9. Agitated saline contrast bubble study was negative, with no evidence  of any interatrial shunt.   CARDIAC CATH: December 2023 demonstrates minimal obstructive coronary artery disease with a cardiac output of 4.68 L/min and index of 2.44 L/min/m with a wedge pressure of 21 mmHg and V waves to 31 mmHg  STS RISK CALCULATOR:  MVR Procedure Type: Isolated MVR Perioperative Outcome Estimate % Operative Mortality 2.22% Morbidity & Mortality 9.98% Stroke 1.52% Renal Failure 1.22% Reoperation 4.63% Prolonged Ventilation 4.56% Deep Sternal Wound Infection 0.037% [facility] Hospital Stay (>14 days) 5.21% Short Hospital Stay (<6 days)* 32.5%  MV Repair  Procedure Type: Isolated MVr Perioperative Outcome Estimate % Operative Mortality 0.934% Morbidity & Mortality 5.52% Stroke 1.24% Renal Failure 0.645% Reoperation 2.8% Prolonged Ventilation 2.2% Deep Sternal Wound Infection 0.024% [facility] Hospital Stay (>14 days) 2.66% Short Hospital Stay (<6 days)* 54.9%   NHYA CLASS: 2  ASSESSMENT AND PLAN:   Severe mitral regurgitation  Longstanding persistent atrial fibrillation ([place]) - Plan: EKG 12-Lead  Nonrheumatic tricuspid valve regurgitation  Alcohol abuse  Dental erosion  Aortic atherosclerosis ([place])  I did have long discussion with the patient about the results of his exercise treadmill stress test and elevated BNP levels.  He admits that he probably does not exert himself to a high degree on a daily basis.  He wants to stay as healthy as possible for as long as possible.  I think it makes sense to proceed with some mitral transcatheter edge-to-edge repair procedure.  We will plan for this after the patient's  dental work upcoming.  I will review the patient's TEE with our structural heart disease group.  Given that he has  bileaflet prolapse and Barlow's pathology the results can be more variable and somewhat unpredictable.  I have personally reviewed the patients imaging data as summarized above.  I have reviewed the natural history of mitral regurgitation with the patient and family members who are present today. We have discussed the limitations of medical therapy and the poor prognosis associated with symptomatic mitral regurgitation. We have also reviewed potential treatment options, including palliative medical therapy, conventional mitral surgery, and transcatheter mitral edge-to-edge repair. We discussed treatment options in the context of this patient's specific comorbid medical conditions.   All of the patient's questions were answered today. Will make further recommendations based on the results of studies outlined above.   Total time spent with patient today 40 minutes. This includes reviewing records, evaluating the patient and coordinating care.   Orbie Pyo, MD  09/05/2022 9:00 AM    [facility] [address] [place], [place], [place]  [address] Phone: [phone]; Fax: [phone]

## 2022-09-05 ENCOUNTER — Encounter: Payer: Self-pay | Admitting: Internal Medicine

## 2022-09-05 ENCOUNTER — Ambulatory Visit: Payer: Medicare Other | Attending: Internal Medicine | Admitting: Internal Medicine

## 2022-09-05 VITALS — BP 132/60 | HR 73 | Ht 69.0 in | Wt 165.4 lb

## 2022-09-05 DIAGNOSIS — I700 Atherosclerosis of aorta: Secondary | ICD-10-CM | POA: Diagnosis not present

## 2022-09-05 DIAGNOSIS — I4811 Longstanding persistent atrial fibrillation: Secondary | ICD-10-CM

## 2022-09-05 DIAGNOSIS — F1010 Alcohol abuse, uncomplicated: Secondary | ICD-10-CM

## 2022-09-05 DIAGNOSIS — I361 Nonrheumatic tricuspid (valve) insufficiency: Secondary | ICD-10-CM | POA: Diagnosis not present

## 2022-09-05 DIAGNOSIS — I340 Nonrheumatic mitral (valve) insufficiency: Secondary | ICD-10-CM

## 2022-09-05 DIAGNOSIS — K032 Erosion of teeth: Secondary | ICD-10-CM

## 2022-09-05 NOTE — Patient Instructions (Addendum)
Medication Instructions:  No changes *If you need a refill on your cardiac medications before your next appointment, please call your pharmacy*   Lab Work: none   Testing/Procedures: See appointment below for pre admission testing appointment  Mitra Clip Procedure - date and instructions to follow   Follow-Up: Per Structural Heart Team--Katy Lynelle Smoke, RN will reach out to you with details.

## 2022-09-18 ENCOUNTER — Telehealth: Payer: Self-pay

## 2022-09-19 ENCOUNTER — Telehealth: Payer: Self-pay

## 2022-09-19 NOTE — Telephone Encounter (Signed)
Discussed the patient's imaging in Valve Team meeting this week. 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 Per Dr. Ali Lowe, will send message to the CHF Team for evaluation for tentative mTEER 2/29. The patient understands the plan and was grateful for call.  While on the phone, confirmed the patient had his dental extractions last week.

## 2022-09-20 ENCOUNTER — Other Ambulatory Visit: Payer: Self-pay

## 2022-09-20 ENCOUNTER — Telehealth (HOSPITAL_COMMUNITY): Payer: Self-pay | Admitting: Vascular Surgery

## 2022-09-20 DIAGNOSIS — I340 Nonrheumatic mitral (valve) insufficiency: Secondary | ICD-10-CM

## 2022-09-20 NOTE — Telephone Encounter (Signed)
Lvm to make new chf appt 2/16 @ 9 w/ db or 2/22

## 2022-09-20 NOTE — Telephone Encounter (Signed)
The patient has been scheduled with Dr. Haroldine Laws 2/16.

## 2022-09-27 ENCOUNTER — Telehealth: Payer: Self-pay

## 2022-09-27 NOTE — Telephone Encounter (Signed)
Due to limiting number of cases on 2/29 to two, the patient understands his mTEER will be postponed to 3/14. Pre-procedure visit rescheduled to 3/11. Apologized for inconvenience. He was very understanding and agreed with plan.

## 2022-09-28 ENCOUNTER — Encounter (HOSPITAL_COMMUNITY): Payer: Medicare Other | Admitting: Internal Medicine

## 2022-09-28 ENCOUNTER — Encounter (HOSPITAL_COMMUNITY): Payer: Self-pay

## 2022-10-05 ENCOUNTER — Encounter (HOSPITAL_COMMUNITY): Payer: Self-pay | Admitting: Internal Medicine

## 2022-10-05 ENCOUNTER — Ambulatory Visit (HOSPITAL_COMMUNITY)
Admission: RE | Admit: 2022-10-05 | Discharge: 2022-10-05 | Disposition: A | Discharge status: Home / Self Care | Payer: Medicare Other | Source: Ambulatory Visit | Attending: Internal Medicine | Admitting: Internal Medicine

## 2022-10-05 VITALS — BP 110/70 | HR 66 | Wt 161.6 lb

## 2022-10-05 DIAGNOSIS — I340 Nonrheumatic mitral (valve) insufficiency: Secondary | ICD-10-CM | POA: Diagnosis not present

## 2022-10-05 DIAGNOSIS — F1010 Alcohol abuse, uncomplicated: Secondary | ICD-10-CM | POA: Diagnosis not present

## 2022-10-05 DIAGNOSIS — I493 Ventricular premature depolarization: Secondary | ICD-10-CM | POA: Diagnosis not present

## 2022-10-05 DIAGNOSIS — I341 Nonrheumatic mitral (valve) prolapse: Secondary | ICD-10-CM | POA: Insufficient documentation

## 2022-10-05 DIAGNOSIS — I482 Chronic atrial fibrillation, unspecified: Secondary | ICD-10-CM | POA: Insufficient documentation

## 2022-10-05 DIAGNOSIS — I4811 Longstanding persistent atrial fibrillation: Secondary | ICD-10-CM | POA: Diagnosis not present

## 2022-10-05 NOTE — Progress Notes (Signed)
ADVANCED HF CLINIC CONSULT NOTE  Referring Physician: Ardith Dark, MD Primary Care: Ardith Dark, MD Primary Cardiologist: Rollene Rotunda, MD  HPI:  Mr. Bickmore is  a 77 y/o male with chronic AF, ETOH abuse and mitral regurgitation referred by Dr. Lynnette Caffey for further evaluation prior to possible mitraClip procedure  The patient has been followed by Dr. Antoine Poche for several years due to his chronic AF. He was last seen in 10/23. Echo in 9/23 showed EF 60-65% with normal size LV with bileaflet MV prolapse and severe mitral regurgitation. (Echo 1/18 EF 55-60% with mild MR by report - I was unable to pull up). He denied any exertional symptoms at the time. He was subsequently referred for TEE/cath and evaluation by Dr. Leafy Ro for possible MVR.  TEE 10/23 LVEF 60-65% Normal LV size RV ok. Severely dilated LAE. MV with bileaflet prolapse. 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 Mild to moderate TR  Cath 12/23 LAD 40% RA 11 PA 46/20 (34) PCWP 21 (v-wave 31) Fick CO 4.7/2.4   Patient was seen by Dr. Leafy Ro who was considering a mitral and tricuspid valve repair as well as a left atrial appendage ligation procedure.  However he had concerns about the patient's recovery and doing well long-term after complex surgical repair given his very significant history of alcohol abuse.    Referred to Dr. Lynnette Caffey for consideration of mTEER. At the time it was difficult to elucidate degree of patient's symptoms as patient's history telling is a bit circumlocutous -> so he was sent for ETT. Patient walked for 4:31 on [place] (6.4 METS). He denied symptoms. Duke score 5 (low risk) but noted to have decreased exercise tolerance for his age. TEE reviewed with Structural team and felt that morphology of valve more c/w atrial MR than Barlow's. So m TEER felt to be option for him.   He presents to discuss mTEER.    He is here by himself. 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 Ran many  marathons including [place] and [place]. He gave up marathoning years ago but says he would still walk frequently. Now he says he is much less active and he says the most walking he does is walking to a bar which can taking him 30 minutes or more. He denies SOB with this. Says he drinks regularly but has tried to cut back. Goes to bar 4-5 days/week and drinks 3-4 beers and 1-2 shots. Has had multiple DUIs and now walking to the bar or taking the bus when it is cold. Says he drinks because he is lonely. His mother died due to ETOH at age 53. Denies HF symptoms - no LE edema, orthopnea, PND, exertional dyspnea. Developed AF in the 90s and this has been chronic. He does not notice palpitations   Review of Systems: [y] = yes, [ ]  = no   General: Weight gain [ ] ; Weight loss [ ] ; Anorexia [ ] ; Fatigue [ ] ; Fever [ ] ; Chills [ ] ; Weakness [ ]   Cardiac: Chest pain/pressure [ ] ; Resting SOB [ ] ; Exertional SOB [ ] ; Orthopnea [ ] ; Pedal Edema [ ] ; Palpitations [ ] ; Syncope [ ] ; Presyncope [ ] ; Paroxysmal nocturnal dyspnea[ ]   Pulmonary: Cough [ ] ; Wheezing[ ] ; Hemoptysis[ ] ; Sputum [ ] ; Snoring [ ]   GI: Vomiting[ ] ; Dysphagia[ ] ; Melena[ ] ; Hematochezia [ ] ; Heartburn[ ] ; Abdominal pain [ ] ; Constipation [ ] ; Diarrhea [ ] ; BRBPR [ ]   GU: Hematuria[ ] ; Dysuria [ ] ; Nocturia[ ]   Vascular: Pain in legs with walking [ ] ; Pain in feet with lying flat [ ] ; Non-healing sores [ ] ; Stroke [ ] ; TIA [ ] ; Slurred speech [ ] ;  Neuro: Headaches[ ] ; Vertigo[ ] ; Seizures[ ] ; Paresthesias[ ] ;Blurred vision [ ] ; Diplopia [ ] ; Vision changes [ ]   Ortho/Skin: Arthritis [ y]; Joint pain [ ] ; Muscle pain [ ] ; Joint swelling [ ] ; Back Pain [ ] ; Rash [ ]   Psych: Depression[y ]; Anxiety[y ]  Heme: Bleeding problems [ ] ; Clotting disorders [ ] ; Anemia [ ]   Endocrine: Diabetes [ ] ; Thyroid dysfunction[ ]    Past Medical History:  Diagnosis Date   Allergic rhinitis    Atrial fibrillation (HCC)    History of ETOH abuse    Hx of  degenerative disc disease     Current Outpatient Medications  Medication Sig Dispense Refill   apixaban (ELIQUIS) 5 MG TABS tablet Take 1 tablet (5 mg total) by mouth 2 (two) times daily. 60 tablet 5   B Complex-C (B-COMPLEX WITH VITAMIN C) tablet Take 1 tablet by mouth daily.     digoxin (LANOXIN) 0.125 MG tablet Take 1 tablet (0.125 mg total) by mouth daily. 90 tablet 2   ibuprofen (ADVIL) 200 MG tablet Take 200 mg by mouth every 8 (eight) hours as needed (pain.).     phenylephrine (SUDAFED PE) 10 MG TABS tablet Take 10 mg by mouth daily.     No current facility-administered medications for this encounter.    No Known Allergies    Social History   Socioeconomic History   Marital status: Single    Spouse name: Not on file   Number of children: 2   Years of education: Not on file   Highest education level: Not on file  Occupational History   Occupation: Retired   Tobacco Use   Smoking status: Never   Smokeless tobacco: Never  Vaping Use   Vaping Use: Never used  Substance and Sexual Activity   Alcohol use: Yes    Alcohol/week: 13.0 standard drinks of alcohol    Types: 8 Cans of beer, 5 Shots of liquor per week    Comment: Daily (4-5 beers, 1-2 shots - 4-5 days a week)   Drug use: Never   Sexual activity: Yes    Partners: Female  Other Topics Concern   Not on file  Social History Narrative   ** Merged History Encounter **       Relocated to [place] from [place] 08/2015 months ago after significant hospital stay with UTI leading to sepsis. Went to rehab for a few months afterwards. Sister in law only family in [place]  Son lives in [place] Daughter lives in [place]    (addiction counselor)    Social Determinants of Health   Financial Resource Strain: Low Risk  (11/03/2020)   Overall Financial Resource Strain (CARDIA)    Difficulty of Paying Living Expenses: Not hard at all  Food Insecurity: No Food Insecurity (11/03/2020)   Hunger Vital Sign    Worried  About Running Out of Food in the Last Year: Never true    Ran Out of Food in the Last Year: Never true  Transportation Needs: No Transportation Needs (11/03/2020)   PRAPARE - Administrator, Civil Service (Medical): No    Lack of Transportation (Non-Medical): No  Physical Activity: Insufficiently Active (11/03/2020)   Exercise Vital Sign    Days of Exercise per Week: 4  days    Minutes of Exercise per Session: 30 min  Stress: No Stress Concern Present (11/03/2020)   [employer] of Occupational Health - Occupational Stress Questionnaire    Feeling of Stress : Only a little  Social Connections: Socially Isolated (11/03/2020)   Social Connection and Isolation Panel [NHANES]    Frequency of Communication with Friends and Family: Twice a week    Frequency of Social Gatherings with Friends and Family: Once a week    Attends Religious Services: Never    Database administrator or Organizations: No    Attends Banker Meetings: Never    Marital Status: Never married  Intimate Partner Violence: Not At Risk (11/03/2020)   Humiliation, Afraid, Rape, and Kick questionnaire    Fear of Current or Ex-Partner: No    Emotionally Abused: No    Physically Abused: No    Sexually Abused: No      Family History  Problem Relation Age of Onset   Alcoholism Mother        Died age 69   Stroke Brother    Cancer Brother        Liver/lung    Vitals:   10/05/22 1121  BP: 110/70  Pulse: 66  SpO2: 97%  Weight: 73.3 kg (161 lb 9.6 oz)    PHYSICAL EXAM: General:  Walked into clinic No respiratory difficulty HEENT: normal Neck: supple. no JVD. Carotids 2+ bilat; no bruits. No lymphadenopathy or thryomegaly appreciated. Cor: PMI nondisplaced. Irregular rate & rhythm.2/6 MR Lungs: clear Abdomen: soft, nontender, nondistended. No hepatosplenomegaly. No bruits or masses. Good bowel sounds. Extremities: no cyanosis, clubbing, rash, edema Neuro: alert & oriented x 3, cranial  nerves grossly intact. moves all 4 extremities w/o difficulty. Affect pleasant.  ECG: AF 77 with frequent PVCs   ASSESSMENT & PLAN:  1. Severe mitral regurgitation - Echo 3/18 EF 55-60% mild MR. No prolapse mentioned (I am unable to pull up study in [place]) - TEE 10/23 LVEF 60-65% normal size RV ok. Severe LAE. MV with bileaflet prolapse.and severe MR. Mild to moderate TR  - seen by Dr. Leafy Ro for possible MV repair and TV ring -> felt not to be surgical candidate due to ETOH abuse -  in looking at TEE this looks to be a combination of LA dilation and Barlow's valve. I reviewed with Dr. Flora Lipps personally who feels that it is most c/w with Barlow's physiology but there has been some disagreement about this with other imagers - The patient's LV is not dilated and he seems to be denies HF or other exertional symptoms. Hurley Cisco said, ETT shows mildly decreased exercise tolerance in the setting of severe ETOH abuse - Previously our experience with Barlow's valve and mTEER has not been optimal. Given what seems to be progressive MR compared to echo 3/18, if primary pathology is felt to be LA dilation MR and it is felt that patient's decreased exercise tolerance can be improved with clipping then it seems reasonable to proceed with mTEER. On the other hand, if Barlows physiology is the predominant factor and/or patient's functional limitations not felt to be related to his MR then I would be less inclined to proceed with mTEER and continue with ongoing follow-up with an attempt to get him to eventual surgery.   2. Chronic AF - due to severe LAE - rate controlled - need to be careful with AC in setting of ETOH abuse  3. ETOH abuse - this is severe  - encouraged  counseling  4. Frequent PVCs - consider Zio patch to quantify  Total time spent 60 minutes. Over half that time spent discussing above.    Arvilla Meres, MD  11:50 AM

## 2022-10-05 NOTE — Patient Instructions (Signed)
CONGRATULATIONS YOU HAVE GRADUATED FROM THE HEART FAILURE CLINIC.  There has been no changes to your medications.  Your physician recommends that you schedule a follow-up appointment in: AS NEEDED.  If you have any questions or concerns before your next appointment please send [place] a message through [place] or call our office at [phone].    TO LEAVE A MESSAGE FOR THE NURSE SELECT OPTION 2, PLEASE LEAVE A MESSAGE INCLUDING: YOUR NAME DATE OF BIRTH CALL BACK NUMBER REASON FOR CALL**this is important as we prioritize the call backs  YOU WILL RECEIVE A CALL BACK THE SAME DAY AS LONG AS YOU CALL BEFORE 4:00 PM  At the [facility], you and your health needs are our priority. As part of our continuing mission to provide you with exceptional heart care, we have created designated Provider Care Teams. These Care Teams include your primary Cardiologist (physician) and Advanced Practice Providers (APPs- Physician Assistants and Nurse Practitioners) who all work together to provide you with the care you need, when you need it.   You may see any of the following providers on your designated Care Team at your next follow up: Dr Glori Bickers Dr Loralie Champagne Dr. Roxana Hires, NP Lyda Jester, [place] [facility] [place], [place] Forestine Na, NP Audry Riles, PharmD   Please be sure to bring in all your medications bottles to every appointment.    Thank you for choosing [facility]

## 2022-10-08 ENCOUNTER — Other Ambulatory Visit (HOSPITAL_COMMUNITY): Payer: Medicare Other

## 2022-10-11 DIAGNOSIS — I340 Nonrheumatic mitral (valve) insufficiency: Secondary | ICD-10-CM

## 2022-10-22 ENCOUNTER — Encounter (HOSPITAL_COMMUNITY)
Admission: RE | Admit: 2022-10-22 | Discharge: 2022-10-22 | Disposition: A | Discharge status: Home / Self Care | Payer: Medicare Other | Source: Ambulatory Visit | Attending: Internal Medicine | Admitting: Internal Medicine

## 2022-10-22 ENCOUNTER — Ambulatory Visit (HOSPITAL_COMMUNITY)
Admission: RE | Admit: 2022-10-22 | Discharge: 2022-10-22 | Disposition: A | Discharge status: Home / Self Care | Payer: Medicare Other | Source: Ambulatory Visit | Attending: Internal Medicine | Admitting: Internal Medicine

## 2022-10-22 DIAGNOSIS — I340 Nonrheumatic mitral (valve) insufficiency: Secondary | ICD-10-CM | POA: Insufficient documentation

## 2022-10-22 DIAGNOSIS — Z1152 Encounter for screening for COVID-19: Secondary | ICD-10-CM | POA: Insufficient documentation

## 2022-10-22 DIAGNOSIS — Z823 Family history of stroke: Secondary | ICD-10-CM | POA: Diagnosis not present

## 2022-10-22 DIAGNOSIS — Z006 Encounter for examination for normal comparison and control in clinical research program: Secondary | ICD-10-CM | POA: Diagnosis not present

## 2022-10-22 DIAGNOSIS — Z954 Presence of other heart-valve replacement: Secondary | ICD-10-CM | POA: Diagnosis not present

## 2022-10-22 DIAGNOSIS — Z01818 Encounter for other preprocedural examination: Secondary | ICD-10-CM | POA: Insufficient documentation

## 2022-10-22 DIAGNOSIS — I5032 Chronic diastolic (congestive) heart failure: Secondary | ICD-10-CM | POA: Diagnosis not present

## 2022-10-22 DIAGNOSIS — Z7901 Long term (current) use of anticoagulants: Secondary | ICD-10-CM | POA: Diagnosis not present

## 2022-10-22 DIAGNOSIS — Z811 Family history of alcohol abuse and dependence: Secondary | ICD-10-CM | POA: Diagnosis not present

## 2022-10-22 DIAGNOSIS — Z809 Family history of malignant neoplasm, unspecified: Secondary | ICD-10-CM | POA: Diagnosis not present

## 2022-10-22 DIAGNOSIS — I110 Hypertensive heart disease with heart failure: Secondary | ICD-10-CM | POA: Diagnosis not present

## 2022-10-22 DIAGNOSIS — Z79899 Other long term (current) drug therapy: Secondary | ICD-10-CM | POA: Diagnosis not present

## 2022-10-22 DIAGNOSIS — I4821 Permanent atrial fibrillation: Secondary | ICD-10-CM | POA: Diagnosis not present

## 2022-10-22 DIAGNOSIS — I4891 Unspecified atrial fibrillation: Secondary | ICD-10-CM | POA: Diagnosis not present

## 2022-10-22 DIAGNOSIS — F1010 Alcohol abuse, uncomplicated: Secondary | ICD-10-CM | POA: Diagnosis present

## 2022-10-22 DIAGNOSIS — I493 Ventricular premature depolarization: Secondary | ICD-10-CM | POA: Diagnosis not present

## 2022-10-22 LAB — CBC
HCT: 38.1 % — ABNORMAL LOW (ref 39.0–52.0)
Hemoglobin: 12.6 g/dL — ABNORMAL LOW (ref 13.0–17.0)
MCH: 31.8 pg (ref 26.0–34.0)
MCHC: 33.1 g/dL (ref 30.0–36.0)
MCV: 96.2 fL (ref 80.0–100.0)
Platelets: 208 10*3/uL (ref 150–400)
RBC: 3.96 MIL/uL — ABNORMAL LOW (ref 4.22–5.81)
RDW: 13.8 % (ref 11.5–15.5)
WBC: 5.1 10*3/uL (ref 4.0–10.5)
nRBC: 0 % (ref 0.0–0.2)

## 2022-10-22 LAB — COMPREHENSIVE METABOLIC PANEL
ALT: 24 U/L (ref 0–44)
AST: 37 U/L (ref 15–41)
Albumin: 3.8 g/dL (ref 3.5–5.0)
Alkaline Phosphatase: 80 U/L (ref 38–126)
Anion gap: 10 (ref 5–15)
BUN: 15 mg/dL (ref 8–23)
CO2: 26 mmol/L (ref 22–32)
Calcium: 9.4 mg/dL (ref 8.9–10.3)
Chloride: 99 mmol/L (ref 98–111)
Creatinine, Ser: 1.03 mg/dL (ref 0.61–1.24)
GFR, Estimated: >60 mL/min (ref >60)
Glucose, Bld: 193 mg/dL — ABNORMAL HIGH (ref 70–99)
Potassium: 3.9 mmol/L (ref 3.5–5.1)
Sodium: 135 mmol/L (ref 135–145)
Total Bilirubin: 1.3 mg/dL — ABNORMAL HIGH (ref 0.3–1.2)
Total Protein: 7.1 g/dL (ref 6.5–8.1)

## 2022-10-22 LAB — SURGICAL PCR SCREEN
MRSA, PCR: NEGATIVE
Staphylococcus aureus: NEGATIVE

## 2022-10-22 LAB — TYPE AND SCREEN
ABO/RH(D): O POS
Antibody Screen: NEGATIVE

## 2022-10-22 LAB — PROTIME-INR
INR: 1.7 — ABNORMAL HIGH (ref 0.8–1.2)
Prothrombin Time: 19.4 seconds — ABNORMAL HIGH (ref 11.4–15.2)

## 2022-10-22 LAB — BRAIN NATRIURETIC PEPTIDE: B Natriuretic Peptide: 334.2 pg/mL — ABNORMAL HIGH (ref 0.0–100.0)

## 2022-10-22 LAB — SARS CORONAVIRUS 2 (TAT 6-24 HRS): SARS Coronavirus 2: NEGATIVE

## 2022-10-22 NOTE — Progress Notes (Addendum)
Patient signed all consents at PAT lab appointment. CHG soap and instructions were given to patient. CHG surgical prep reviewed with patient and all questions answered.  Med rec completed at pre-op lab appointment. Pt unable to give urine sample. Will need to be collected DOS and order was changed to reflect.

## 2022-10-25 ENCOUNTER — Other Ambulatory Visit: Payer: Self-pay

## 2022-10-25 ENCOUNTER — Inpatient Hospital Stay (HOSPITAL_COMMUNITY): Payer: Medicare Other | Admitting: Physician Assistant

## 2022-10-25 ENCOUNTER — Inpatient Hospital Stay (HOSPITAL_COMMUNITY)
Admission: RE | Admit: 2022-10-25 | Discharge: 2022-10-26 | DRG: 267 | Disposition: A | Discharge status: Home / Self Care | Payer: Medicare Other | Source: Ambulatory Visit | Attending: Internal Medicine | Admitting: Internal Medicine

## 2022-10-25 ENCOUNTER — Encounter (HOSPITAL_COMMUNITY): Payer: Self-pay | Admitting: Internal Medicine

## 2022-10-25 ENCOUNTER — Inpatient Hospital Stay (HOSPITAL_COMMUNITY): Payer: Medicare Other | Admitting: Certified Registered Nurse Anesthetist

## 2022-10-25 ENCOUNTER — Inpatient Hospital Stay (HOSPITAL_COMMUNITY): Payer: Medicare Other

## 2022-10-25 ENCOUNTER — Encounter (HOSPITAL_COMMUNITY)
Admission: RE | Disposition: A | Discharge status: Home / Self Care | Payer: Self-pay | Source: Ambulatory Visit | Attending: Internal Medicine

## 2022-10-25 DIAGNOSIS — E8779 Other fluid overload: Secondary | ICD-10-CM | POA: Insufficient documentation

## 2022-10-25 DIAGNOSIS — Z79899 Other long term (current) drug therapy: Secondary | ICD-10-CM | POA: Diagnosis not present

## 2022-10-25 DIAGNOSIS — I5032 Chronic diastolic (congestive) heart failure: Secondary | ICD-10-CM | POA: Diagnosis present

## 2022-10-25 DIAGNOSIS — Z006 Encounter for examination for normal comparison and control in clinical research program: Secondary | ICD-10-CM

## 2022-10-25 DIAGNOSIS — I493 Ventricular premature depolarization: Secondary | ICD-10-CM | POA: Diagnosis present

## 2022-10-25 DIAGNOSIS — Z7901 Long term (current) use of anticoagulants: Secondary | ICD-10-CM | POA: Diagnosis not present

## 2022-10-25 DIAGNOSIS — Z809 Family history of malignant neoplasm, unspecified: Secondary | ICD-10-CM

## 2022-10-25 DIAGNOSIS — I4821 Permanent atrial fibrillation: Secondary | ICD-10-CM | POA: Diagnosis present

## 2022-10-25 DIAGNOSIS — Z1152 Encounter for screening for COVID-19: Secondary | ICD-10-CM

## 2022-10-25 DIAGNOSIS — Z811 Family history of alcohol abuse and dependence: Secondary | ICD-10-CM

## 2022-10-25 DIAGNOSIS — Z9889 Other specified postprocedural states: Secondary | ICD-10-CM

## 2022-10-25 DIAGNOSIS — I110 Hypertensive heart disease with heart failure: Secondary | ICD-10-CM | POA: Diagnosis present

## 2022-10-25 DIAGNOSIS — I340 Nonrheumatic mitral (valve) insufficiency: Secondary | ICD-10-CM

## 2022-10-25 DIAGNOSIS — Z954 Presence of other heart-valve replacement: Secondary | ICD-10-CM | POA: Diagnosis not present

## 2022-10-25 DIAGNOSIS — I4891 Unspecified atrial fibrillation: Secondary | ICD-10-CM | POA: Diagnosis present

## 2022-10-25 DIAGNOSIS — Z823 Family history of stroke: Secondary | ICD-10-CM

## 2022-10-25 DIAGNOSIS — F1010 Alcohol abuse, uncomplicated: Secondary | ICD-10-CM | POA: Diagnosis present

## 2022-10-25 HISTORY — DX: Cardiac arrhythmia, unspecified: I49.9

## 2022-10-25 HISTORY — DX: Depression, unspecified: F32.A

## 2022-10-25 HISTORY — DX: Other specified postprocedural states: Z98.890

## 2022-10-25 HISTORY — PX: TRANSCATHETER MITRAL EDGE TO EDGE REPAIR: CATH118311

## 2022-10-25 HISTORY — PX: TEE WITHOUT CARDIOVERSION: SHX5443

## 2022-10-25 LAB — ECHO TEE
MV M vel: 5.81 m/s
MV Peak grad: 135 mmHg
Radius: 0.65 cm

## 2022-10-25 LAB — POCT ACTIVATED CLOTTING TIME
Activated Clotting Time: 266 seconds
Activated Clotting Time: 293 seconds
Activated Clotting Time: 298 seconds
Activated Clotting Time: 298 seconds
Activated Clotting Time: 331 seconds
Activated Clotting Time: 293 seconds
Activated Clotting Time: 320 seconds

## 2022-10-25 LAB — ABO/RH: ABO/RH(D): O POS

## 2022-10-25 SURGERY — MITRAL VALVE REPAIR
Anesthesia: General

## 2022-10-25 MED ORDER — CHLORHEXIDINE GLUCONATE 0.12 % MT SOLN
15.0000 mL | Freq: Once | OROMUCOSAL | Status: AC
  Administered 2022-10-25: 15 mL via OROMUCOSAL
  Filled 2022-10-25: qty 15

## 2022-10-25 MED ORDER — LORAZEPAM 0.5 MG PO TABS: 1.0000 mg | ORAL_TABLET | ORAL | Status: DC | PRN

## 2022-10-25 MED ORDER — LABETALOL HCL 5 MG/ML IV SOLN: 10.0000 mg | INTRAVENOUS | Status: DC | PRN

## 2022-10-25 MED ORDER — SODIUM CHLORIDE 0.9 % IV SOLN: INTRAVENOUS | Status: DC

## 2022-10-25 MED ORDER — THIAMINE MONONITRATE 100 MG PO TABS
100.0000 mg | ORAL_TABLET | Freq: Every day | ORAL | Status: DC
  Administered 2022-10-26: 100 mg via ORAL
  Filled 2022-10-25: qty 1

## 2022-10-25 MED ORDER — SODIUM CHLORIDE 0.9% FLUSH: 3.0000 mL | INTRAVENOUS | Status: DC | PRN

## 2022-10-25 MED ORDER — ADULT MULTIVITAMIN W/MINERALS CH
1.0000 | ORAL_TABLET | Freq: Every day | ORAL | Status: DC
  Administered 2022-10-26: 1 via ORAL
  Filled 2022-10-25: qty 1

## 2022-10-25 MED ORDER — DIGOXIN 125 MCG PO TABS
0.1250 mg | ORAL_TABLET | Freq: Every day | ORAL | Status: DC
  Administered 2022-10-26: 0.125 mg via ORAL
  Filled 2022-10-25: qty 1

## 2022-10-25 MED ORDER — HYDRALAZINE HCL 20 MG/ML IJ SOLN: 5.0000 mg | INTRAMUSCULAR | Status: DC | PRN

## 2022-10-25 MED ORDER — PHENYLEPHRINE HCL-NACL 20-0.9 MG/250ML-% IV SOLN
INTRAVENOUS | Status: DC | PRN
  Administered 2022-10-25: 15 ug/min via INTRAVENOUS

## 2022-10-25 MED ORDER — LORAZEPAM 2 MG/ML IJ SOLN: 1.0000 mg | INTRAMUSCULAR | Status: DC | PRN

## 2022-10-25 MED ORDER — EPHEDRINE SULFATE-NACL 50-0.9 MG/10ML-% IV SOSY
PREFILLED_SYRINGE | INTRAVENOUS | Status: DC | PRN
  Administered 2022-10-25: 10 mg via INTRAVENOUS
  Administered 2022-10-25 (×3): 5 mg via INTRAVENOUS

## 2022-10-25 MED ORDER — PROTAMINE SULFATE 10 MG/ML IV SOLN
INTRAVENOUS | Status: DC | PRN
  Administered 2022-10-25: 30 mg via INTRAVENOUS

## 2022-10-25 MED ORDER — CHLORHEXIDINE GLUCONATE 4 % EX LIQD: 30.0000 mL | CUTANEOUS | Status: DC

## 2022-10-25 MED ORDER — ACETAMINOPHEN 500 MG PO TABS
1000.0000 mg | ORAL_TABLET | Freq: Once | ORAL | Status: AC
  Administered 2022-10-25: 1000 mg via ORAL
  Filled 2022-10-25: qty 2

## 2022-10-25 MED ORDER — LACTATED RINGERS IV SOLN: INTRAVENOUS | Status: DC | PRN

## 2022-10-25 MED ORDER — FOLIC ACID 1 MG PO TABS
1.0000 mg | ORAL_TABLET | Freq: Every day | ORAL | Status: DC
  Administered 2022-10-26: 1 mg via ORAL
  Filled 2022-10-25: qty 1

## 2022-10-25 MED ORDER — SODIUM CHLORIDE 0.9 % IV SOLN
0.1500 ug/kg/min | Freq: Once | INTRAVENOUS | Status: AC
  Administered 2022-10-25: .1 ug/kg/min via INTRAVENOUS
  Filled 2022-10-25: qty 2000

## 2022-10-25 MED ORDER — ACETAMINOPHEN 325 MG PO TABS: 650.0000 mg | ORAL_TABLET | ORAL | Status: DC | PRN

## 2022-10-25 MED ORDER — ONDANSETRON HCL 4 MG/2ML IJ SOLN: 4.0000 mg | Freq: Four times a day (QID) | INTRAMUSCULAR | Status: DC | PRN

## 2022-10-25 MED ORDER — HEPARIN (PORCINE) IN NACL 2000-0.9 UNIT/L-% IV SOLN
INTRAVENOUS | Status: DC | PRN
  Administered 2022-10-25: 1000 mL

## 2022-10-25 MED ORDER — CHLORHEXIDINE GLUCONATE 4 % EX LIQD: 60.0000 mL | Freq: Once | CUTANEOUS | Status: DC

## 2022-10-25 MED ORDER — PROPOFOL 10 MG/ML IV BOLUS
INTRAVENOUS | Status: DC | PRN
  Administered 2022-10-25: 50 mg via INTRAVENOUS
  Administered 2022-10-25: 150 mg via INTRAVENOUS

## 2022-10-25 MED ORDER — CEFAZOLIN SODIUM-DEXTROSE 2-4 GM/100ML-% IV SOLN
2.0000 g | INTRAVENOUS | Status: AC
  Administered 2022-10-25: 2 g via INTRAVENOUS
  Filled 2022-10-25: qty 100

## 2022-10-25 MED ORDER — ROCURONIUM BROMIDE 10 MG/ML (PF) SYRINGE
PREFILLED_SYRINGE | INTRAVENOUS | Status: DC | PRN
  Administered 2022-10-25: 50 mg via INTRAVENOUS
  Administered 2022-10-25: 20 mg via INTRAVENOUS
  Administered 2022-10-25: 10 mg via INTRAVENOUS
  Administered 2022-10-25 (×2): 20 mg via INTRAVENOUS

## 2022-10-25 MED ORDER — ONDANSETRON HCL 4 MG/2ML IJ SOLN
INTRAMUSCULAR | Status: DC | PRN
  Administered 2022-10-25: 4 mg via INTRAVENOUS

## 2022-10-25 MED ORDER — THIAMINE HCL 100 MG/ML IJ SOLN: 100.0000 mg | Freq: Every day | INTRAMUSCULAR | Status: DC

## 2022-10-25 MED ORDER — LIDOCAINE 2% (20 MG/ML) 5 ML SYRINGE
INTRAMUSCULAR | Status: DC | PRN
  Administered 2022-10-25: 60 mg via INTRAVENOUS

## 2022-10-25 MED ORDER — HEPARIN (PORCINE) IN NACL 1000-0.9 UT/500ML-% IV SOLN
INTRAVENOUS | Status: DC | PRN
  Administered 2022-10-25 (×2): 500 mL

## 2022-10-25 MED ORDER — DEXAMETHASONE SODIUM PHOSPHATE 10 MG/ML IJ SOLN
INTRAMUSCULAR | Status: DC | PRN
  Administered 2022-10-25: 5 mg via INTRAVENOUS

## 2022-10-25 MED ORDER — FENTANYL CITRATE (PF) 250 MCG/5ML IJ SOLN
INTRAMUSCULAR | Status: DC | PRN
  Administered 2022-10-25: 100 ug via INTRAVENOUS

## 2022-10-25 MED ORDER — SUGAMMADEX SODIUM 200 MG/2ML IV SOLN
INTRAVENOUS | Status: DC | PRN
  Administered 2022-10-25: 140 mg via INTRAVENOUS

## 2022-10-25 MED ORDER — APIXABAN 5 MG PO TABS
5.0000 mg | ORAL_TABLET | Freq: Two times a day (BID) | ORAL | Status: DC
  Administered 2022-10-26: 5 mg via ORAL
  Filled 2022-10-25: qty 1

## 2022-10-25 MED ORDER — HEPARIN SODIUM (PORCINE) 1000 UNIT/ML IJ SOLN
INTRAMUSCULAR | Status: DC | PRN
  Administered 2022-10-25: 2000 [IU] via INTRAVENOUS
  Administered 2022-10-25: 1000 [IU] via INTRAVENOUS
  Administered 2022-10-25: 2000 [IU] via INTRAVENOUS
  Administered 2022-10-25: 10000 [IU] via INTRAVENOUS
  Administered 2022-10-25: 4000 [IU] via INTRAVENOUS

## 2022-10-25 MED ORDER — SODIUM CHLORIDE 0.9 % IV SOLN: 250.0000 mL | INTRAVENOUS | Status: DC | PRN

## 2022-10-25 MED ORDER — FENTANYL CITRATE (PF) 100 MCG/2ML IJ SOLN
INTRAMUSCULAR | Status: AC
  Filled 2022-10-25: qty 2

## 2022-10-25 MED ORDER — SODIUM CHLORIDE 0.9% FLUSH
3.0000 mL | Freq: Two times a day (BID) | INTRAVENOUS | Status: DC
  Administered 2022-10-25 – 2022-10-26 (×2): 3 mL via INTRAVENOUS

## 2022-10-25 SURGICAL SUPPLY — 22 items
CLOSURE PERCLOSE PROSTYLE (VASCULAR PRODUCTS) IMPLANT
KIT MICROPUNCTURE NIT STIFF (SHEATH) IMPLANT
KIT VERSACROSS LRG ACCESS (CATHETERS) IMPLANT
PACK CARDIAC CATHETERIZATION (CUSTOM PROCEDURE TRAY) ×1 IMPLANT
PAD ELECT DEFIB RADIOL ZOLL (MISCELLANEOUS) IMPLANT
PROTECTION STATION PRESSURIZED (MISCELLANEOUS) ×1
RAIL STABILIZER SYS PASCAL (SYSTAGENIX WOUND MANAGEMENT) IMPLANT
SHEATH DILAT COONS TAPER 22F (SHEATH) IMPLANT
SHEATH DRYSEAL FLEX 24FR 33CM (SHEATH) IMPLANT
SHEATH PASCAL GUIDE 22F (SHEATH) IMPLANT
SHEATH PINNACLE 8F 10CM (SHEATH) IMPLANT
SHEATH PROBE COVER 6X72 (BAG) ×1 IMPLANT
STATION PROTECTION PRESSURIZED (MISCELLANEOUS) IMPLANT
STOPCOCK MORSE 400PSI 3WAY (MISCELLANEOUS) ×6 IMPLANT
SYST IMPL PASCAL ACE PRECISION (Prosthesis & Implant Heart) ×3 IMPLANT
SYSTEM IMPL PSCL ACE PRECISION (Prosthesis & Implant Heart) IMPLANT
SYSTEM PASCAL MITRAL REPAIR (SYSTAGENIX WOUND MANAGEMENT) IMPLANT
TRANSDUCER W/STOPCOCK (MISCELLANEOUS) ×1 IMPLANT
TUBING ART PRESS 72  MALE/FEM (TUBING) ×1
TUBING ART PRESS 72 MALE/FEM (TUBING) ×1 IMPLANT
WIRE AMPLATZ SS-J .035X180CM (WIRE) IMPLANT
WIRE EMERALD 3MM-J .035X150CM (WIRE) IMPLANT

## 2022-10-25 NOTE — Anesthesia Procedure Notes (Signed)
Procedure Name: Intubation Date/Time: 10/25/2022 8:03 AM  Performed by: Darletta Moll, CRNAPre-anesthesia Checklist: Patient identified, Emergency Drugs available, Suction available and Patient being monitored Patient Re-evaluated:Patient Re-evaluated prior to induction Oxygen Delivery Method: Circle system utilized Preoxygenation: Pre-oxygenation with 100% oxygen Induction Type: IV induction Ventilation: Mask ventilation without difficulty and Oral airway inserted - appropriate to patient size Laryngoscope Size: Mac and 4 Grade View: Grade I Tube type: Oral Tube size: 7.5 mm Number of attempts: 1 Airway Equipment and Method: Stylet and Oral airway Placement Confirmation: ETT inserted through vocal cords under direct vision, positive ETCO2 and breath sounds checked- equal and bilateral Secured at: 23 cm Tube secured with: Tape Dental Injury: Teeth and Oropharynx as per pre-operative assessment

## 2022-10-25 NOTE — Transfer of Care (Signed)
Immediate Anesthesia Transfer of Care Note  Patient: Cameron Patton  Procedure(s) Performed: MITRAL VALVE REPAIR TRANSESOPHAGEAL ECHOCARDIOGRAM  Patient Location: PACU  Anesthesia Type:General  Level of Consciousness: drowsy and patient cooperative  Airway & Oxygen Therapy: Patient Spontanous Breathing and Patient connected to nasal cannula oxygen  Post-op Assessment: Report given to RN, Post -op Vital signs reviewed and stable, and Patient moving all extremities X 4  Post vital signs: Reviewed and stable  Last Vitals:  Vitals Value Taken Time  BP 167/103 10/25/22 1210  Temp    Pulse 76 10/25/22 1211  Resp 15 10/25/22 1211  SpO2 100 % 10/25/22 1211  Vitals shown include unvalidated device data.  Last Pain:  Vitals:   10/25/22 0640  TempSrc: Oral  PainSc:          Complications: There were no known notable events for this encounter.

## 2022-10-25 NOTE — Anesthesia Preprocedure Evaluation (Signed)
Anesthesia Evaluation  Patient identified by MRN, date of birth, ID band Patient awake    Reviewed: Allergy & Precautions, NPO status , Patient's Chart, lab work & pertinent test results  Airway Mallampati: II  TM Distance: >3 FB Neck ROM: Full    Dental  (+) Dental Advisory Given   Pulmonary neg pulmonary ROS   breath sounds clear to auscultation       Cardiovascular + dysrhythmias Atrial Fibrillation + Valvular Problems/Murmurs MR  Rhythm:Regular Rate:Normal     Neuro/Psych negative neurological ROS     GI/Hepatic negative GI ROS, Neg liver ROS,,,  Endo/Other  negative endocrine ROS    Renal/GU negative Renal ROS     Musculoskeletal   Abdominal   Peds  Hematology negative hematology ROS (+)   Anesthesia Other Findings   Reproductive/Obstetrics                             Anesthesia Physical Anesthesia Plan  ASA: 4  Anesthesia Plan: General   Post-op Pain Management: Tylenol PO (pre-op)*   Induction: Intravenous  PONV Risk Score and Plan: 2 and Dexamethasone, Ondansetron and Treatment may vary due to age or medical condition  Airway Management Planned: Oral ETT  Additional Equipment: Arterial line  Intra-op Plan:   Post-operative Plan: Extubation in OR  Informed Consent: I have reviewed the patients History and Physical, chart, labs and discussed the procedure including the risks, benefits and alternatives for the proposed anesthesia with the patient or authorized representative who has indicated his/her understanding and acceptance.     Dental advisory given  Plan Discussed with: CRNA  Anesthesia Plan Comments:         Anesthesia Quick Evaluation

## 2022-10-25 NOTE — Op Note (Signed)
PROCEDURE:  Transcatheter edge to edge mitral valve repair (TEER) INDICATION: Severe symptomatic mitral regurgitation (Stage D)  SURGEON:  Lenna Sciara, MD  CO-SURGEON: Sherren Mocha, MD  IMAGER: Rudean Haskell, MD  PROCEDURAL DETAILS: General anesthesia is induced.  The patient is prepped and draped.  Baseline transesophageal echo images are obtained and confirm appropriate anatomy for transcatheter edge-to-edge mitral valve repair.  Using vascular ultrasound guidance, the right common femoral vein is accessed via a front wall puncture.  2 Perclose sutures are deployed and an 8 [place] sheath is inserted.  A 24 Fr Dry Seal sheath is inserted over and Amplatz superstiff wire. A versa cross wire is advanced into the SVC.  Heparin is administered and a therapeutic ACT is achieved.  Transseptal puncture is performed over the mid posterior portion of the fossa.  The septum is dilated while the steerable guide catheter is prepped.  The 68 [place] guide catheter is inserted and advanced towards the interatrial septum, but thrombus is visualized on the dilator on the RA side of the septum. The dilator/sheath/transseptal wire are immediately removed from the body and the sheath aspirated. The ACT was therapeutic at this time. Additional heparin is administered and the transseptal puncture is performed again with an ACT of approximately 300 seconds. The dilator and the versa cross wire were carefully removed and the guide tip is appropriately positioned approximately 2 cm across the interatrial septum.  A PASCAL ACE device is prepped per protocol.  The implant system consisting of the PASCAL device, steerable catheter, and implant catheters is inserted through the guide catheter.  The PASCAL ACE device is positioned above the mitral valve after applying appropriate manuevers to the guide and steerable catheters.  The PASCAL ACE device is then placed in the capture ready position and oriented coaxially with  the anterior and posterior leaflets of the mitral valve.  Low tidal volume ventilation is initiated.  The clip device is advanced across the mitral valve and pulled back until capture of both the anterior and posterior leaflets is achieved over the medial aspect of the valve. Clasps are dropped and the device is closed under TEE guidance.  Careful TEE assessment is performed and reduction in mitral valve regurgitation is felt to be appropriate.  Leaflet insertion is verified using 3D imaging.  The PASCAL ACE device is deployed using normal technique.  The residual MR jet is lateral to the initial Pascal device, and we decided to place another device adjacent to the first Pascal. Using similar technique, a second Pascal ACE device is placed just lateral to the first device in the center of A2/P2, in the same orientation as the first device. Once optimal leaflet insertion is obtained, this device is released using the same technique. There is residual 1-2+ MR with mild jets between the first 2 devices and another mild jet just lateral to the second device. Gradients remained low with a mean gradient of 2 mmHg. A 3rd Pascal ACE is then placed lateral to the second device, with caution taken to avoid interaction. After full TEE assessment, the procedural result is felt to be adequate with reduction of mitral regurgitation to 1+. Mean transvalvular gradient remains low at 3 mmHg and there is no pericardial effusion at the completion of the case. The patient remains hemodynamically stable. The residual ASD is left-to-right.   PROCEDURE COMPLETION: The guide catheter is pulled back into the right atrium and the interatrial septum is assessed with TEE.  There is no significant septal  injury seen and no right to left shunting.  The guide catheter is removed and the Perclose sutures are tightened.  Protamine is administered.  CONCLUSION: Successful transcatheter edge-to-edge mitral valve repair under fluoroscopic and echo  guidance, reducing baseline 4+ mitral regurgitation to 1+, with placement of 3 Pascal ACE devices, positioned A3/P3, A2/P2, and lateral A2/P2. Final mean transmitral gradient 3 mmHg. No immediate complication.  Sherren Mocha 10/25/2022 3:00 PM

## 2022-10-25 NOTE — Progress Notes (Signed)
LOCATION: left RADIAL  DRESSING APPLIED: a-line removed and manual pressure held for approximately 6-7 minutes  SITE UPON ARRIVAL: LEVEL 0  SITE AFTER BAND REMOVAL: LEVEL 0  CIRCULATION SENSATION AND MOVEMENT: +1 bilateral radial pulse noted, + movement noted, slight bruising noted around insertion site  COMMENTS: instruction given related to right groin and left wrist access sites, drsg x24 hrs, no sitting in water x1 week, no hot tubs, bath tubs, pools, shower only, no heavy lifting, no running, jumping, etc

## 2022-10-25 NOTE — Anesthesia Procedure Notes (Signed)
Arterial Line Insertion Start/End3/14/2024 7:15 AM Performed by: Josephine Igo, CRNA, CRNA  Patient location: Pre-op. Preanesthetic checklist: patient identified, IV checked, site marked, risks and benefits discussed, surgical consent, monitors and equipment checked, pre-op evaluation, timeout performed and anesthesia consent Lidocaine 1% used for infiltration Left, radial was placed Catheter size: 20 G Hand hygiene performed  and maximum sterile barriers used   Attempts: 1 Procedure performed without using ultrasound guided technique. Following insertion, dressing applied and Biopatch. Post procedure assessment: normal and unchanged  Patient tolerated the procedure well with no immediate complications.

## 2022-10-25 NOTE — H&P (Signed)
Cardiology Admission History and Physical   Patient ID: Cameron Patton MRN: [id number]; DOB: September 28, 1945   Admission date: 10/25/2022  PCP:  Vivi Barrack, MD   [facility] Prov[facility] Cardiologist:  Minus Breeding, MD        Chief Complaint:  Dyspnea    History of Present Illness:   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  He was seen by cardiothoracic surgery and not thought to be an optimal surgical candidate.  For this reason he is referred for mitral transcatheter edge-to-edge repair.  Today the patient is well and reports unchanged level in his dyspnea.  He has had no bleeding or bruising issues, chest pain, presyncope or syncope.   Past Medical History:  Diagnosis Date   Allergic rhinitis    Atrial fibrillation ([place])    Depression    Dysrhythmia    History of ETOH abuse    Hx of degenerative disc disease     Past Surgical History:  Procedure Laterality Date   ABDOMINAL AORTOGRAM N/A 07/18/2022   Procedure: ABDOMINAL AORTOGRAM;  Surgeon: Jettie Booze, MD;  Location: [facility];  Service: Cardiovascular;  Laterality: N/A;   BUBBLE STUDY  05/30/2022   Procedure: BUBBLE STUDY;  Surgeon: Geralynn Rile, MD;  Location: [facility];  Service: Cardiovascular;;   CATARACT EXTRACTION BILATERAL W/ ANTERIOR VITRECTOMY Bilateral 2015   INGUINAL HERNIA REPAIR Right 1982   RIGHT/LEFT HEART CATH AND CORONARY ANGIOGRAPHY N/A 07/18/2022   Procedure: RIGHT/LEFT HEART CATH AND CORONARY ANGIOGRAPHY;  Surgeon: Jettie Booze, MD;  Location: [facility];  Service: Cardiovascular;  Laterality: N/A;    TEE WITHOUT CARDIOVERSION N/A 05/30/2022   Procedure: TRANSESOPHAGEAL ECHOCARDIOGRAM (TEE);  Surgeon: Geralynn Rile, MD;  Location: [facility];  Service: Cardiovascular;  Laterality: N/A;     Medications Prior to Admission: Prior to Admission medications   Medication Sig Start Date End Date Taking? Authorizing Provider  Acetaminophen-DM (COLD & COUGH DAYTIME PO) Take 1 tablet by mouth daily.   Yes [provider]  apixaban (ELIQUIS) 5 MG TABS tablet Take 1 tablet (5 mg total) by mouth 2 (two) times daily. 08/25/22  Yes Early Osmond, MD  B Complex-C (B-COMPLEX WITH VITAMIN C) tablet Take 1 tablet by mouth daily.   Yes [provider]  digoxin (LANOXIN) 0.125 MG tablet Take 1 tablet (0.125 mg total) by mouth daily. 04/30/22  Yes Minus Breeding, MD  Doxylamine Succinate, Sleep, (SLEEP AID PO) Take 1-2 tablets by mouth at bedtime as needed (sleep).   Yes [provider]  ibuprofen (ADVIL) 200 MG tablet Take 200 mg by mouth as needed for headache.   Yes [provider]     Allergies:   No Known Allergies  Social History:   Social History   Socioeconomic History   Marital status: Single    Spouse name: Not on file   Number of children: 2   Years of education: Not on file   Highest education level: Not on file  Occupational History   Occupation: Retired   Tobacco Use   Smoking status: Never   Smokeless tobacco: Never  Vaping Use   Vaping Use: Never used  Substance and  Sexual Activity   Alcohol use: Yes    Alcohol/week: 13.0 standard drinks of alcohol    Types: 8 Cans of beer, 5 Shots of liquor per week    Comment: Daily (4-5 beers, 1-2 shots - 4-5 days a week)   Drug use: Never   Sexual activity: Yes    Partners: Female  Other Topics Concern   Not on file  Social History Narrative   ** Merged History Encounter **       Relocated to [place] from [place] 08/2015 months ago after significant hospital stay with UTI leading to  sepsis. Went to rehab for a few months afterwards. Sister in law only family in [place]  Son lives in [place] Daughter lives in [place]    (addiction counselor)    Social Determinants of Health   Financial Resource Strain: Low Risk  (11/03/2020)   Overall Financial Resource Strain (CARDIA)    Difficulty of Paying Living Expenses: Not hard at all  Food Insecurity: No Food Insecurity (11/03/2020)   Hunger Vital Sign    Worried About Running Out of Food in the Last Year: Never true    [place] in the Last Year: Never true  Transportation Needs: No Transportation Needs (11/03/2020)   PRAPARE - Hydrologist (Medical): No    Lack of Transportation (Non-Medical): No  Physical Activity: Insufficiently Active (11/03/2020)   Exercise Vital Sign    Days of Exercise per Week: 4 days    Minutes of Exercise per Session: 30 min  Stress: No Stress Concern Present (11/03/2020)   [place]    Feeling of Stress : Only a little  Social Connections: Socially Isolated (11/03/2020)   Social Connection and Isolation Panel [NHANES]    Frequency of Communication with Friends and Family: Twice a week    Frequency of Social Gatherings with Friends and Family: Once a week    Attends Religious Services: Never    Marine scientist or Organizations: No    Attends Archivist Meetings: Never    Marital Status: Never married  Intimate Partner Violence: Not At Risk (11/03/2020)   Humiliation, Afraid, Rape, and Kick questionnaire    Fear of Current or Ex-Partner: No    Emotionally Abused: No    Physically Abused: No    Sexually Abused: No    Family History:   The patient's family history includes Alcoholism in his mother; Cancer in his brother; Stroke in his brother.    ROS:  Please see the history of present illness.  All other ROS reviewed and negative.     Physical Exam/Data:   Vitals:    10/25/22 0640  BP: (!) 156/64  Pulse: (!) 52  Resp: 18  Temp: 98.7 F (37.1 C)  TempSrc: Oral  SpO2: 100%  Weight: 73.5 kg  Height: '5\' 9"'$  (1.753 m)   No intake or output data in the 24 hours ending 10/25/22 0705    10/25/2022    6:40 AM 10/05/2022   11:21 AM 09/05/2022    8:25 AM  Last 3 Weights  Weight (lbs) 162 lb 161 lb 9.6 oz 165 lb 6.4 oz  Weight (kg) 73.483 kg 73.301 kg 75.025 kg     Body mass index is 23.92 kg/m.  General:  Well nourished, well developed, in no acute distress HEENT: normal Neck: no JVD Vascular: No carotid bruits; Distal pulses 2+ bilaterally   Cardiac: Irregular rate  and rhythm normal S1, S2; 2/6 holosystolic murmur Lungs:  clear to auscultation bilaterally, no wheezing, rhonchi or rales  Abd: soft, nontender, no hepatomegaly  Ext: no edema Musculoskeletal:  No deformities, BUE and BLE strength normal and equal Skin: warm and dry  Neuro:  CNs 2-12 intact, no focal abnormalities noted Psych:  Normal affect    EKG:  The ECG that was done February 2024 was personally reviewed and demonstrates atrial fibrillation with PVCs  Relevant CV Studies:  2D ECHO: September 2023  1. Billowing mitral valve. Centrally directed MR jet likely atrial  functional given normal appearance of leaflets with central jet. Severe  mitral regurgitation is present. The mitral valve is grossly normal.  Severe mitral valve regurgitation. No  evidence of mitral stenosis.   2. Left ventricular ejection fraction, by estimation, is 60 to 65%. The  left ventricle has normal function. The left ventricle has no regional  wall motion abnormalities. There is moderate concentric left ventricular  hypertrophy. Left ventricular  diastolic function could not be evaluated.   3. Right ventricular systolic function is normal. The right ventricular  size is moderately enlarged. There is mildly elevated pulmonary artery  systolic pressure. The estimated right ventricular systolic  pressure is  [id number] mmHg.   4. Left atrial size was severely dilated.   5. Right atrial size was severely dilated.   6. Tricuspid valve regurgitation is mild to moderate.   7. The aortic valve is tricuspid. Aortic valve regurgitation is not  visualized. No aortic stenosis is present.   8. The inferior vena cava is dilated in size with <50% respiratory  variability, suggesting right atrial pressure of 15 mmHg.   9. Severe biatrial enlargement.    TEE:  October 2023  1. Severe mitral valve regurgitation. 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 Regurgitation jet is central in the P2-P3  distribution. Systolic blunting in all pulmonary  veins. 2D PISA radius 0.9 cm, 2D ERO 0.33 cm2, R vol 65 cc, RF 57%, 3D VCA  0.50 cm2 (oval shape). All quantitation consistent with severe MR. MG 1  mmHG @ 75 bpm. MVA 7.1 cm2 by direct 3D planimetry. PMVL length ~19 mm.  Favorable IAS for transeptal  puncture. Mild posterior annular calcium is present. The mitral valve is  myxomatous. Severe mitral valve regurgitation. No evidence of mitral  stenosis.   2. Left ventricular ejection fraction, by estimation, is 60 to 65%. The  left ventricle has normal function.   3. Right ventricular systolic function is normal. The right ventricular  size is moderately enlarged.   4. Left atrial size was severely dilated. No left atrial/left atrial  appendage thrombus was detected. The LAA emptying velocity was 25 cm/s.   5. Right atrial size was severely dilated.   6. Tricuspid valve regurgitation is mild to moderate.   7. The aortic valve is tricuspid. Aortic valve regurgitation is not  visualized. Aortic valve sclerosis/calcification is present, without any  evidence of aortic stenosis.   8. There is Moderate (Grade III) protruding plaque involving the aortic  arch and descending aorta.   9. Agitated saline contrast bubble study was  negative, with no evidence  of any interatrial shunt.    CARDIAC CATH: December 2023 demonstrates minimal obstructive coronary artery disease with a cardiac output of 4.68 L/min and index of 2.44 L/min/m with a wedge pressure of 21 mmHg and V waves to 31  mmHg  Laboratory Data:  High Sensitivity Troponin:  No results for input(s): "TROPONINIHS" in the last 720 hours.    Chemistry Recent Labs  Lab 10/22/22 0759  NA 135  K 3.9  CL 99  CO2 26  GLUCOSE 193*  BUN 15  CREATININE 1.03  CALCIUM 9.4  GFRNONAA >60  ANIONGAP 10    Recent Labs  Lab 10/22/22 0759  PROT 7.1  ALBUMIN 3.8  AST 37  ALT 24  ALKPHOS 80  BILITOT 1.3*   Lipids No results for input(s): "CHOL", "TRIG", "HDL", "LABVLDL", "LDLCALC", "CHOLHDL" in the last 168 hours. Hematology Recent Labs  Lab 10/22/22 0759  WBC 5.1  RBC 3.96*  HGB 12.6*  HCT 38.1*  MCV 96.2  MCH 31.8  MCHC 33.1  RDW 13.8  PLT 208   Thyroid No results for input(s): "TSH", "FREET4" in the last 168 hours. BNP Recent Labs  Lab 10/22/22 0759  BNP 334.2*    DDimer No results for input(s): "DDIMER" in the last 168 hours.   Radiology/Studies:  No results found.   Assessment and Plan:   Severe mitral regurgitation: Referred for elective mitral transcatheter edge-to-edge repair today with [facility] system. Atrial fibrillation: Will continue Eliquis and plan on Plavix following the procedure History of alcohol abuse: Will initiate CIWA protocol following the procedure   Risk Assessment/Risk Scores:         CHA2DS2-VASc Score = 3   This indicates a 3.2% annual risk of stroke. The patient's score is based upon: CHF History: 1 HTN History: 0 Diabetes History: 0 Stroke History: 0 Vascular Disease History: 0 Age Score: 2 Gender Score: 0      Severity of Illness: The appropriate patient status for this patient is INPATIENT. Inpatient status is judged to be reasonable and necessary in order to provide the required  intensity of service to ensure the patient's safety. The patient's presenting symptoms, physical exam findings, and initial radiographic and laboratory data in the context of their chronic comorbidities is felt to place them at high risk for further clinical deterioration. Furthermore, it is not anticipated that the patient will be medically stable for discharge from the hospital within 2 midnights of admission.   * I certify that at the point of admission it is my clinical judgment that the patient will require inpatient hospital care spanning beyond 2 midnights from the point of admission due to high intensity of service, high risk for further deterioration and high frequency of surveillance required.*   For questions or updates, please contact [facility] Please consult [email or web address] for contact info under     Signed, Early Osmond, MD  10/25/2022 7:05 AM

## 2022-10-25 NOTE — Progress Notes (Signed)
  [facility]   MULTIDISCIPLINARY HEART VALVE TEAM  Patient doing well s/p TEER. He is hemodynamically stable. Groin site is stable. Plan for early ambulation after bedrest completed and hopeful discharge over the next 24-48 hours.   Verdon Ferrante NP-C Structural Heart Team  Pager: [phone] Phone: [phone]  

## 2022-10-25 NOTE — Op Note (Signed)
HEART AND [facility]ASCULAR CENTER   MULTIDISCIPLINARY HEART TEAM  Date of Procedure:  10/25/2022  Preoperative Diagnosis: Severe Symptomatic Mitral Regurgitation (Stage D)  Postoperative Diagnosis: Same   Procedure Performed: Ultrasound-guided right transfemoral venous access Double PreClose right femoral vein Transseptal puncture using Bailess RF needle Mitral edge to edge repair with ACE x 3  Surgeon: Lenna Sciara, MD Co-Surgeon: Blane Ohara, MD Echocardiographer: Rudean Haskell, MD  Anesthesiologist: Suzette Battiest, MD  Device Implant: PASCAL ACE x 3  (Serial #s [id number], and [id number]) placed at medial to central aspect of A2/P2  Procedural Indication: Severe Non-rheumatic Mitral Regurgitation (Stage D)   Brief History: The patient is a 77 year old male with a history of atrial fibrillation on Eliquis, severe symptomatic mixed etiology mitral regurgitation secondary to atrial functional mitral regurgitation and bileaflet prolapse with NYHA class II symptoms, mild to moderate tricuspid regurgitation, and alcohol abuse who underwent a multidisciplinary review to formulate the best treatment plan regarding his severe mitral regurgitation.  He was seen by Dr. Lavonna Monarch who did not think he was a optimal surgical candidate.  For this reason and after advanced heart failure opinion, he is referred for elective transcatheter mitral edge-to-edge repair with the [facility] system.  NYHA class: 2  Echo Findings: Preop:  Normal LV systolic function Severe mixed etiology MR secondary to bileaflet prolapse and atrial mitral regurgitation, Grade 4+ Post-op:  Unchanged LV systolic function Trace residual MR  Procedural Details: Prep The patient is brought to the cardiac catheterization lab in the fasting state. General anesthesia is induced. The patient is prepped from the groin to chin. A foley catheter is placed. Hemodynamics are monitored via a radial artery line.    Venous Access Using ultrasound guidance, the right femoral vein is punctured. Ultrasound images are captured and stored in the patient's chart. The vein is dilated and 2 Perclose devices are deplyed at 10' and 2' positions to 'Preclose' the femoral vein. An 8 Fr sheath is inserted.  Transseptal Puncture A Baylis Versacross wire is advanced into the SVC A Baylis transseptal dilator is advanced into the SVC, and the VersaCross RF wire is retracted into the dilator  The transseptal sheath is retracted into the RA under fluoroscopic and echo guidance to obtain position on the posterior fossa where echo measurements are made to assure appropriate access to the mitral valve. Once proper position is confirmed by echo, RF energy is delivered and the VersaCross wire is advanced into the LA without resistance. The dilator and sheath are advanced over the wire where proper position is confirmed by echo and pressure measurement Weight based IV heparin is administered and a therapeutic ACT > 250 is confirmed Initial puncture position was found to be at a height of around 6 cm which was not optimal.  We therefore repeated the transseptal puncture with a more anterior position.  Due to the size of the atria I could not be definitively measured however we thought it to be around 5 cm.  Guide Catheter Insertion The VersaCross wire is positioned at the left upper pulmonary vein The femoral vein is progressively dilated and the 22 Fr guide catheter is inserted and then directed across the interatrial septum over the wire. Position is confirmed approximately 2 cm into the left atrium.  The dilator and wire is then removed and the guide catheter is locked into place on the stabilizer rail platform system.  PASCAL ACE Insertion The PASCAL ACE  is prepped per protocol and inserted via the introducer  into the guide catheter The implant system (consisting of the steerable and implant catheters) is through the loader, the  loader is removed, and 45cc of blood is aspirated and the system is flushed with an equal volume of saline. The implant system is advanced under fluoro and echo guidance so that it exits the guide catheter into the left atrium.  The device is then closed and the clasps are retracted.  PASCAL ACE Positioning in the Left Atrium (Supravalvular Alignment) Using flexion and torquing of the steerable catheter as well as advancement and retraction of the guide catheter, the device is centered above the target of therapy and with an optimized trajectory 2D and 3D TEE imaging is performed in multiple planes and the device is positioned and aligned above the valve using standard steering techniques   Entry into the Left Ventricle and Mitral Valve Leaflet Grasp The device is opened to the capture-ready configuration The clasps are then independently lowered to test functionality The device is advanced across the mitral valve into the LV, maintaining proper orientation The Clip arms are opened to 180 degrees and the device is slowly retracted  Capture of both the anterior and posterior leaflets are visualized by echo and the clasps are dropped  PASCAL ACE Deployment After extensive echo evaluation, reduction in mitral regurgitation is felt to be adequate The suture locks are then unthreaded and removed from the lock base The implant lock is then unthreaded and removed from the lock base  After TEE evaluation we saw that there were 2 significant areas of residual mitral regurgitation lateral to the first device and we therefore pursued additional device implantation immediately lateral to the initial device.  Placement of PASCAL ACE (second device) PASCAL Insertion The PASCAL ACE is prepped per protocol and inserted via the introducer into the steerable guide catheter The implant system is advanced under fluoro and echo guidance so that it exits the guide catheter into the left atrium.  The device is then  closed and the clasps are retracted.  PASCAL Positioning in the Left Atrium (Supravalvular Alignment) Using flexion and torquing of the steerable catheter as well as advancement and retraction of the guide catheter, the device is centered above the target of therapy and with an optimized trajectory 2D and 3D TEE imaging is performed in multiple planes and the device is positioned and aligned above the valve using standard steering techniques   Entry into the Left Ventricle and Mitral Valve Leaflet Grasp The PASCAL ACE is advanced across the mitral valve into the LV, maintaining proper orientation and with caution taken to avoid contact with the first PASCAL device Capture of both the anterior and posterior leaflets are visualized by echo and the clasps are dropped  PASCAL ACE Deployment After extensive echo evaluation, reduction in mitral regurgitation is felt to be adequate The suture locks are then unthreaded and removed from the lock base The implant lock is then unthreaded and removed from the lock base  TEE evaluation at this point demonstrated residual moderate mitral regurgitation lateral to the first 2 devices.  The mean gradient at this point was 2 mmHg.  We elected to pursue an additional device placement lateral to the second device.  Placement of PASCAL ACE (third device) PASCAL Insertion The PASCAL ACE is prepped per protocol and inserted via the introducer into the steerable guide catheter The implant system is advanced under fluoro and echo guidance so that it exits the guide catheter into the left atrium.  The device  is then closed and the clasps are retracted.  PASCAL Positioning in the Left Atrium (Supravalvular Alignment) Using flexion and torquing of the steerable catheter as well as advancement and retraction of the guide catheter, the device is centered above the target of therapy and with an optimized trajectory 2D and 3D TEE imaging is performed in multiple planes and  the device is positioned and aligned above the valve using standard steering techniques   Entry into the Left Ventricle and Mitral Valve Leaflet Grasp The PASCAL ACE is advanced across the mitral valve into the LV, maintaining proper orientation and with caution taken to avoid contact with the first PASCAL device Capture of both the anterior and posterior leaflets are visualized by echo and the clasps are dropped  PASCAL ACE Deployment After extensive echo evaluation, reduction in mitral regurgitation is felt to be adequate The suture locks are then unthreaded and removed from the lock base The implant lock is then unthreaded and removed from the lock base  Device Removal The implant system is removed under echo guidance The guide catheter is retracted into the right atrium and the interatrial septum is assessed by echo without evidence of right-to-left shunting or large ASD  Hemostasis The guide catheter is removed over a 0.035" wire and the Perclose sutures are tightened with complete hemostasis and no evidence of hematoma  Estimated blood loss: minimal  There are no immediate procedural complications. The patient is transferred to the post-procedure recovery area in stable condition.   Early Osmond 10/25/2022 12:37 PM

## 2022-10-25 NOTE — Anesthesia Postprocedure Evaluation (Signed)
Anesthesia Post Note  Patient: Cameron Patton  Procedure(s) Performed: MITRAL VALVE REPAIR TRANSESOPHAGEAL ECHOCARDIOGRAM     Patient location during evaluation: PACU Anesthesia Type: General Level of consciousness: awake and alert Pain management: pain level controlled Vital Signs Assessment: post-procedure vital signs reviewed and stable Respiratory status: spontaneous breathing, nonlabored ventilation, respiratory function stable and patient connected to nasal cannula oxygen Cardiovascular status: blood pressure returned to baseline and stable Postop Assessment: no apparent nausea or vomiting Anesthetic complications: no  There were no known notable events for this encounter.  Last Vitals:  Vitals:   10/25/22 1310 10/25/22 1315  BP: (!) 147/79 (!) 157/94  Pulse: (!) 53 67  Resp: 17 15  Temp:    SpO2: 99% (!) 88%    Last Pain:  Vitals:   10/25/22 1240  TempSrc: Temporal  PainSc: 0-No pain                 Tiajuana Amass

## 2022-10-26 ENCOUNTER — Encounter (HOSPITAL_COMMUNITY): Payer: Self-pay | Admitting: Internal Medicine

## 2022-10-26 ENCOUNTER — Inpatient Hospital Stay (HOSPITAL_COMMUNITY): Payer: Medicare Other

## 2022-10-26 DIAGNOSIS — Z954 Presence of other heart-valve replacement: Secondary | ICD-10-CM

## 2022-10-26 DIAGNOSIS — Z95818 Presence of other cardiac implants and grafts: Secondary | ICD-10-CM

## 2022-10-26 DIAGNOSIS — E8779 Other fluid overload: Secondary | ICD-10-CM | POA: Insufficient documentation

## 2022-10-26 DIAGNOSIS — Z9889 Other specified postprocedural states: Secondary | ICD-10-CM

## 2022-10-26 LAB — BASIC METABOLIC PANEL
Anion gap: 9 (ref 5–15)
BUN: 16 mg/dL (ref 8–23)
CO2: 26 mmol/L (ref 22–32)
Calcium: 9 mg/dL (ref 8.9–10.3)
Chloride: 98 mmol/L (ref 98–111)
Creatinine, Ser: 1.05 mg/dL (ref 0.61–1.24)
GFR, Estimated: >60 mL/min (ref >60)
Glucose, Bld: 210 mg/dL — ABNORMAL HIGH (ref 70–99)
Potassium: 4.3 mmol/L (ref 3.5–5.1)
Sodium: 133 mmol/L — ABNORMAL LOW (ref 135–145)

## 2022-10-26 LAB — CBC
HCT: 35 % — ABNORMAL LOW (ref 39.0–52.0)
Hemoglobin: 11.7 g/dL — ABNORMAL LOW (ref 13.0–17.0)
MCH: 31.6 pg (ref 26.0–34.0)
MCHC: 33.4 g/dL (ref 30.0–36.0)
MCV: 94.6 fL (ref 80.0–100.0)
Platelets: 156 10*3/uL (ref 150–400)
RBC: 3.7 MIL/uL — ABNORMAL LOW (ref 4.22–5.81)
RDW: 13.8 % (ref 11.5–15.5)
WBC: 5 10*3/uL (ref 4.0–10.5)
nRBC: 0 % (ref 0.0–0.2)

## 2022-10-26 LAB — ECHOCARDIOGRAM COMPLETE
Area-P 1/2: 1.24 cm2
Est EF: 55
Height: 69 in
MV M vel: 4.92 m/s
MV Peak grad: 96.8 mmHg
MV VTI: 1.17 cm2
Radius: 0.3 cm
S' Lateral: 3.3 cm
Weight: 2444.46 oz

## 2022-10-26 LAB — MAGNESIUM: Magnesium: 1.7 mg/dL (ref 1.7–2.4)

## 2022-10-26 MED ORDER — METOPROLOL SUCCINATE ER 25 MG PO TB24: 12.5000 mg | ORAL_TABLET | Freq: Every day | ORAL | Status: DC

## 2022-10-26 MED ORDER — FUROSEMIDE 20 MG PO TABS: 20.0000 mg | ORAL_TABLET | Freq: Every day | ORAL | 2 refills | Status: DC

## 2022-10-26 MED ORDER — METOPROLOL SUCCINATE ER 25 MG PO TB24: 12.5000 mg | ORAL_TABLET | Freq: Once | ORAL | Status: DC

## 2022-10-26 MED ORDER — FUROSEMIDE 20 MG PO TABS
20.0000 mg | ORAL_TABLET | Freq: Every day | ORAL | Status: DC
  Administered 2022-10-26: 20 mg via ORAL
  Filled 2022-10-26: qty 1

## 2022-10-26 MED ORDER — METOPROLOL SUCCINATE ER 25 MG PO TB24: 12.5000 mg | ORAL_TABLET | Freq: Every day | ORAL | 2 refills | Status: DC

## 2022-10-26 MED ORDER — GLUCOSE 40 % PO GEL
ORAL | Status: AC
  Filled 2022-10-26: qty 1.21

## 2022-10-26 NOTE — TOC Transition Note (Signed)
Transition of Care [facility]) - CM/SW Discharge Note   Patient Details  Name: Cameron Patton MRN: [id number] Date of Birth: 1946-06-19  [facility] CM/SW Contact:  Zenon Mayo, RN Phone Number: 10/26/2022, 2:09 PM   Clinical Narrative:    Patient is for dc today, has no needs.         Patient Goals and CMS Choice      Discharge Placement                         Discharge Plan and Services Additional resources added to the After Visit Summary for                                       Social Determinants of Health (SDOH) Interventions SDOH Screenings   Food Insecurity: No Food Insecurity (11/03/2020)  Housing: Low Risk  (11/03/2020)  Transportation Needs: No Transportation Needs (11/03/2020)  Depression (PHQ2-9): Low Risk  (12/05/2021)  Financial Resource Strain: Low Risk  (11/03/2020)  Physical Activity: Insufficiently Active (11/03/2020)  Social Connections: Socially Isolated (11/03/2020)  Stress: No Stress Concern Present (11/03/2020)  Tobacco Use: Low Risk  (10/26/2022)     Readmission Risk Interventions     No data to display

## 2022-10-26 NOTE — TOC Progression Note (Signed)
Transition of Care [facility]) - Progression Note    Patient Details  Name: Cameron Patton MRN: [id number] Date of Birth: [birth year]  Transition [facility]) CM/SW Contact  Zenon Mayo, RN Phone Number: 10/26/2022, 11:25 AM  Clinical Narrative:     s/p mitral clip, for echo today.  TOC following.        Expected Discharge Plan and Services                                               Social Determinants of Health (SDOH) Interventions SDOH Screenings   Food Insecurity: No Food Insecurity (11/03/2020)  Housing: Low Risk  (11/03/2020)  Transportation Needs: No Transportation Needs (11/03/2020)  Depression (PHQ2-9): Low Risk  (12/05/2021)  Financial Resource Strain: Low Risk  (11/03/2020)  Physical Activity: Insufficiently Active (11/03/2020)  Social Connections: Socially Isolated (11/03/2020)  Stress: No Stress Concern Present (11/03/2020)  Tobacco Use: Low Risk  (10/26/2022)    Readmission Risk Interventions     No data to display

## 2022-10-26 NOTE — Progress Notes (Signed)
Discharge paper work reviewed with patient. All of patient's questions were answered. All IV's removed. Patient wheeled to main entrance and helped into private vehicle.

## 2022-10-26 NOTE — Discharge Instructions (Signed)
Intensive Outpatient Programs  [facility]    [facility] [place] [address]     [place] [address] [place],  [place], [place]      [place]  (Inpatient and outpatient)  [phone] (Suboxone and Methadone) [address]           [phone]           ADS: Alcohol & Drug Services    [facility] - Intensive Outpatient [address]     [address] [place] [id number] [place], [place] [address]     [place], [place]      [id number]  [facility] [place] (Outpatient, Inpatient, Chemical  [facility] (Groups and Residental) (insurance only) [phone]    [place], [place]       [facility]    [facility] (for caregivers and family) [address]     [address] [place], [place], [place]      [phone]  Residential Treatment Programs  [place]  Work [place](2 years) Residential: 3 days)  [facility] ([place] [place], [place], [place] [id number]      [phone] or [phone]  [facility] [place]    [facility] [address]      [place], [place], [place]      [phone]  [place]   [facility] Treatment Services (RTS) [place]     [address] [place], [place] [address]     [place], [place]      [phone] Admissions: 8am-3pm M-F  [facility] Program: Residential Program [phone] Days)              [facility]C: [facility]  [place], [place], [place] or [phone]    (Walk in Hours over the weekend or by referral)   [place]obil Crisis: [facility] Alternatives:[phone] (for crisis  response 24 hours a day)        Outpatient Psychiatry and Counseling  [facility]: Mobile Crisis Management:  [place] (Formerly known as [facility] [employer])         [address] [place], [place] [address] [phone]  [place] sliding scale fee and walk in schedule: M-F 8am-12pm/1pm-3pm [place], [place] [address] [place] [facility]/ Intensive Outpatient Therapy Program [place], [place] [address] [phone]  [facility]   [facility] [address]   [address], [place], [place] [address]    [place],  [id number]     [phone]  [facility] [address] [place] [address]  [place] [address]     [place] [address] [phone]     [phone]  [place]  [place], [place] [address]    [place] [address]    [place] [address] [place]  [facility]   [facility] [address] [place]     [place], [place]     [place] [phone] E. [place], MD [place], [place] [address] [place] [phone]     [place], [place] [address] [phone] Family Solutions: ([place] speakin) [phone]  [facility]    [facility] [address]    [place], [place] [address]    [place],  [address] [phone]     [phone]   INTRODUCTION In an infectious disease outbreak, when social distancing and self-quarantine are needed to limit and control the  spread of the disease, continued social connectedness to maintain recovery is critically important. Virtual resources can and should be used during this time. Even after a pandemic, virtual support may still exist and still be necessary. This tip sheet describes resources that can be used to virtually support recovery from mental/ substance use disorders as well as other resources. VIRTUAL RECOVERY PROGRAMS Alcoholics Anonymous: Offers online support  https://aa-intergroup.org/  Cocaine Anonymous: Offers online support and services  https://www.ca-online.org/  LifeRing: LifeRing Secular Recovery offers online support        http://cox.biz/  In The Rooms - Online Recovery Meetings: Provides online support through live meetings and discussion groups   https://www.intherooms.com/home/  Marijuana Anonymous: Sports administrator support        https://ma-online.org/  Narcotics Anonymous: Offers a Dentist and skype meeting options  WarningSounds.pl  Reddit Recovery: Offers a virtual hang out and support during recovery https://www. MarketingSpree.co.uk   STAYING CONNECTED IS IMPORTANT:  [place] Recovery: Provides online and virtual support    https://www.refugerecovery.org/home  Self-Management and Recovery Training (SMART) Recovery: Offers global community of mutual-support groups, forums including a chat room and message board    https://www.smartrecovery.org/community/  SoberCity: Offers an Medical laboratory scientific officer and recovery community  RoofingBuilder.uy  Sobergrid: Offers an Water engineer to help anyone get sober and stay sober    https://www.sobergrid.com/  Soberistas: Provides a Public affairs consultant community  https://soberistas.com/  Sober Recovery: Provides an International aid/development worker for those in recovery and their friends and family https://www.soberrecovery.com/forums/  We Connect Recovery:  Provides daily online recovery groups for those with substance use   and mental illness         https://www.weconnectrecovery.com/freeonline-support-meetings RECOVERY RESOURCES AND SUPPORTS AA Beyond Belief A program for the agnostics, atheists, and free thinkers interested in [place] RecyclingBulbs.co.uk Al-Anon Family Groups Providing help and hope for families and friends of alcoholics https://al-anon.org/ Toll-Free: 1-877-SAMHSA-7 734-258-4701)  Info@samhsa .SamedayNews.es  http://store.SamedayNews.com.cy  Association of Recovery in Higher Education Represents collegiate recovery programs and collegiate recovery communities, the faculty and staff who support them and the students who represent them https://collegiaterecovery.org/ Buddhist Recovery Network Promotes the use of Buddhist teachings and practices to help people recovery from the suffering caused by addictive behaviors https://www.buddhistrecovery.org/ Celebrate Recovery A Christ-centered 12-step recovery program https://www.celebraterecovery.com/ Crystal Meth Anonymous Fellowship of people who share their experience, strength and hope with each other so they may solve their common problem and  help others to recovery from addiction to crystal meth http://www.green.org/ [employer]  ([place] Peer Organization) virtual clubhouse with links to virtual "living rooms" and other community-focused virtual spaces SignatureLawyer.fi [place] Abide [employer], blogs, mobile apps, social media groups, and movie suggestions, including the online support community, The Daily Pledge: https://www. hazeldenbettyford.org/recovery/tools/dailypledge and "Tips for Staying Connected and Safeguarding Your Addiction Recovery": https://www.hazeldenbettyford.org/about-us/ news-media/tips-for-staying-connected HelpGuide A small non-profit organization that manages one of the world's top ten mental health  websites https://www.helpguide.org/ Latinx Therapy  Founded to destigmatize mental health issues in the Latinx community https://latinxtherapy.com/ [place] and Covid-19 Information and Resources PurpleChip.dk Moderation Management  [place] dedicated to reducing the harm cause by the abuse of alcohol https://moderation.org/ [place] for Mental Illness (NAMI) HelpLine Coronavirus Information and Resources Guide AirplaneReviews.fr- Chief of Staff for Recovery Residences Compiled best practices and resources https://beard-kelley.com/ One Sprint [employer] A national resource center for [place] and [place] Native health, education and research. Dedicated to quality health care across [place] country http://www.oneskycenter.org/ Peer Inman of Excellence Provides training, technical assistance and resources on peer support services, recovery community organization capacity building, peer workforce development and evidence based practice utilization PrintStats.tn Project LETS Peer-led communities of support education and advocacy for folks with live experience of mental illness, trauma, disability, and/or neurodivergence MyFabulous.co.nz Recovery Dharma Method of freeing from suffering of addiction using  Buddhist practices and principles AirplaneReviews.fr Rural Communities Opioid Response Program Technical Assistance June 2020 [facility] "Sustaining Recovery Support Services During the COVID-19 Pandemic" https://www.rcorp-ta.org/sites/default/ files/2020-08/RCORP-Newsbrief-SustainingRecovery-02-20-2019-508_0.pdf Shatterproof  "How I'm Coping with COVID-19 and Social Isolation as a Person in Long-Term Recovery" provides helpful suggestions  http://bit.ly/shatterproof-coping She Recovers An international movement of self-identified women in or seeking recovery from a range of issues, including substance use disorders, trauma, abuse, codependency, grief etc. https://sherecovers.org/ Toll-Free: 1-877-SAMHSA-7 (678)357-1491)  Info@samhsa .SamedayNews.es  http://store.SamedayNews.com.cy  Page 2 [facility] "Anxious About the Coronavirus? Here Are Eight Practical Tips on How to Stay Calm and Support Your Immune System." http://bit.ly/Chopra-calm The [place] (fitness oriented recovery support) Now live streaming fitness classes through Zoom http://www.foster.info/ The [employer] Corporation The leading national organization providing crisis intervention and suicide prevention services to LGBTQ young people under age 43 http://www.nolan.info/ Therapy for Black Men Provides an Museum/gallery exhibitions officer for men to find a therapist to fit their needs by searching by location, specialization, and other detailed criteria  https://neal-mccoy.info/ Advertising account executive trans peer support for community, run by and for trans people CreditClassification.com.pt VetChange Free, confidential online program to help Veterans cut back or stop drinking alcohol and learn to manage post-traumatic stress disorder symptoms without using alcohol http://www.brewer-callahan.com/ VIRTUAL RECOVERY RESOURCES We R Native Targeted to Automatic Data youth, including all genders who have questions or difficulties GreatestFeeling.tn Thrivent Financial or Caremark Rx sobriety, recovery, addictions prevention and wellness/wellbriety 12-step learning resources to the [employer]  Native community nationwide http://www.weber.com/ Women for Sobriety An organization and self-help program also called the [employer] for women with substance use disorders Ellustrate.fi HELPFUL RESOURCES HOTLINES  SAMHSA's Disaster Distress  Helpline   Call or Text: [phone] (English and espaol)  Website: https://www.disasterdistress.SamedayNews.com.cy  SAMHSA's [employer]: 1-800-662-HELP (24/7/365 Treatment Referral Information Service in [place] and espaol)  Website:     ePompanoBeach.com.br National Suicide Prevention Lifeline   Toll-Free ([place]): 1-800-273-TALK (365) 254-8034)  Toll-Free (espaol): (517)206-3102  TTY: (364)[phone])  Website (English):   https://www.suicidepreventionlifeline.org  Website (espaol):  http://www.peck.info/ Treatment  Locator Behavioral Health Treatment Services Locator Website:  SubTravel.com.au  For help finding treatment 1-800-662-HELP 316-717-2630) SubTravel.com.au  [place]  Toll-Free: 321 226 0090   Email:  DTAC@samhsa .SamedayNews.es   Website: ResumeQuery.de  Note: Inclusion or mention of a resource in this fact sheet does not imply endorsement by the Substance Abuse and [place], or the U.S. Department of Health and [employer].

## 2022-10-26 NOTE — Social Work (Signed)
CSW acknowledges the consult for Substance Use resources. CSW met with pt at bedside, pt confirming he has struggled with alcohol use since 2017. He states he has had 3 DUI's and has been to residential treatment and other treatments several times. He states he experiences loneliness and discussed his social situation with CSW. CSW and pt discussed a plan to work on sobriety, and the medical implications this could have. Pt notes a plan to increase social activity in the community and cut down on his time at bars. Counseling, AA, and therapy were discussed and recommended as well. Pt agreeable to resources being added to AVS. CSW added resources and provided support to pt. TOC will be available for further needs.

## 2022-10-26 NOTE — Progress Notes (Signed)
CARDIAC REHAB PHASE I    Pt resting in bed feeling well today. Just returned to bed after walk with RN, reports tolerating well with no dizziness, CP or SOB. Post mitral clip education including heart healthy diet, restrictions, risk factors, exercise guidelines, site care and CRP2 reviewed. All questions and concerns addressed. [facility] refer to [facility] for CRP2. Plan for home later today.   [id number] Vanessa Barbara, RN BSN 10/26/2022 12:09 PM

## 2022-10-26 NOTE — Progress Notes (Signed)
Echocardiogram 2D Echocardiogram has been performed.  Oneal Deputy Natsuko Kelsay RDCS 10/26/2022, 11:06 AM

## 2022-10-26 NOTE — Discharge Summary (Addendum)
[facility]  Discharge Summary    Patient ID: Cameron Patton MRN: [id number]; DOB: May 31, 1946  Admit date: 10/25/2022 Discharge date: 10/26/2022  Primary Care Provider: Vivi Barrack, MD  Primary Cardiologist: Minus Breeding, MD/ Dr. Ali Lowe, MD and Dr. Burt Knack, MD (TEER)   Discharge Diagnoses    Principal Problem:   S/P mitral valve clip implantation Active Problems:   Atrial fibrillation [facility])   Alcohol abuse   Severe mitral regurgitation   Cardiac volume overload  Allergies No Known Allergies  Diagnostic Studies/Procedures    TEER 10/26/22:  CONCLUSION: Successful transcatheter edge-to-edge mitral valve repair under fluoroscopic and echo guidance, reducing baseline 4+ mitral regurgitation to 1+, with placement of 3 Pascal ACE devices, positioned A3/P3, A2/P2, and lateral A2/P2. Final mean transmitral gradient 3 mmHg. No immediate complication.  _____________   Echo 10/26/22:    1. Left ventricular ejection fraction, by estimation, is 55%. The left  ventricle has normal function. The left ventricle has no regional wall  motion abnormalities. Left ventricular diastolic parameters are  indeterminate. There is the interventricular  septum is flattened in systole and diastole, consistent with right  ventricular pressure and volume overload.   2. Right ventricular systolic function is low normal. The right  ventricular size is moderately enlarged. There is mildly elevated  pulmonary artery systolic pressure. The estimated right ventricular  systolic pressure is 123456 mmHg.   3. Left atrial size was severely dilated.   4. Right atrial size was severely dilated.   5. The mitral valve has been repaired with three PASCAL ACE devices.  Attachmen is preserved.   6. Mild to moderate mitral valve regurgitation, see, at worst, in the [employer]. The mean mitral valve gradient is 5.0 mmHg with average heart rate  of 74 bpm.   7.  Tricuspid valve regurgitation is moderate.   8. The aortic valve is abnormal. Aortic valve regurgitation is not  visualized. No aortic stenosis is present.   9. The inferior vena cava is dilated in size with <50% respiratory  variability, suggesting right atrial pressure of 15 mmHg.  10. Evidence of atrial level shunting detected by color flow Doppler. Post  procedure there are two residual ASDs.   History of Present Illness     Cameron Patton is a 77 y.o. male with a history of permanent AF on Eliquis, current ETOH abuse, frequent PVCs, and severe MR who presented to [facility] on 10/25/22 for planned TEER.  Cameron Patton is followed by Dr. Percival Spanish for his cardiology care. He has been noted to have mitral valve disease for quite some time however in the mild to moderate range. More recently he was seen with an echocardiogram that showed MR in the severe range with dilated annulus causing a defect at medical A2/P2 with a broad jet originating from medial A2/P2. AP gap 0.3cm, anterior leaflet length 2.2cm, posterior leaflet length 1.4cm. Mean gradient 1.82mmHg. Estimated TSP height 5.1cm. Subsequent TEE performed 05/30/22 which confirmed severe MR with an LVEF 60-65%, MV area: 7.1cm2. Mean gradient: 1.89mmHg, Peak gradient: 3.71mmHg, Jet: Central. MR PISA radius: 0.9cm, ERO 0.33 cm2, R vol 65 cc, RF 57%, 3D VCA 0.50 cm2 (oval shape).   In consultation with Dr. Ali Lowe he reported mild symptoms but when questioned further, he stated issues with walking his dogs, often needing frequent stops. Plan was an exercise tolerance test which showed overall mild to moderate risk exercise treadmill test mostly secondary to decreased  exercise effort felt to be secondary to severe MR.   The patient has been evaluated by the multidisciplinary valve team and felt to have severe, symptomatic mitral regurgitation and to be a suitable candidate for TEER, which was set up for 10/25/22.     Hospital Course     Severe MR: s/p  successful transcatheter edge-to-edge mitral valve repair under fluoroscopic and echo guidance, reducing baseline 4+ mitral regurgitation to 1+, with placement of 3 Pascal ACE devices, positioned A3/P3, A2/P2, and lateral A2/P2. Final mean transmitral gradient 3 mmHg. No immediate complication. POD echocardiogram normal LVEF at 55% with mildly elevated PASP at 44.37mmHg, PASCAL ACE devices present with mild to moderate MR post implant. There is moderate TR with atrial shunting with two residual ASDs noted. He was started on los dose Lasix which he will continue at discharge. Post op instructions reviewed with understanding. He has ambulated with CRI without issues. Plan for TOC follow up next week then one month office visit with repeat echocardiogram. He will require lifelong dental SBE; to be RX'ed at follow up next week.   Permanent atrial fibrillation with frequent PVCs: Know to have frequent PVCs. Currently taking digoxin and anticoagulated with Eliquis 5mg  BID. Toprol 12.5mg  QD added to regimen.   Alcohol abuse: CIWA protocol initiated while hospitalized with no evidence of withdrawal. Cessation discussed.   Acute on chronic CHF: Noted to have acute volume overload on post op echo with worsening MR. Started on low dose Lasix and will follow OP with BMET/Mg+.   Consultants: None    The patient has been seen and examined by Dr. Ali Lowe who feels that he is stable and ready for discharge after echocardiogram and patient ambulates with CR.  _____________  Discharge Vitals Blood pressure (!) 143/78, pulse 72, temperature 98.1 F (36.7 C), temperature source Oral, resp. rate 18, height 5\' 9"  (1.753 m), weight 69.3 kg, SpO2 99 %.  Filed Weights   10/25/22 0640 10/25/22 1541  Weight: 73.5 kg 69.3 kg   General: Well developed, well nourished, NAD Skin: Warm, dry, intact  Head: Normocephalic, atraumatic, sclera non-icteric, no xanthomas, clear, moist mucus membranes. Neck: Negative for carotid  bruits. No JVD Lungs:Clear to ausculation bilaterally. No wheezes, rales, or rhonchi. Breathing is unlabored. Cardiovascular: Irregularly irregular. + murmur Abdomen: Soft, non-tender, non-distended. No obvious abdominal masses. Extremities: No edema. Groin and radial sites stable.  Neuro: Alert and oriented. No focal deficits. No facial asymmetry. MAE spontaneously. Psych: Responds to questions appropriately with normal affect.    Labs & Radiologic Studies    CBC Recent Labs    10/25/22 2328  WBC 5.0  HGB 11.7*  HCT 35.0*  MCV 94.6  PLT [id number]   Basic Metabolic Panel Recent Labs    10/25/22 2328 10/26/22 1002  NA 133*  --   K 4.3  --   CL 98  --   CO2 26  --   GLUCOSE 210*  --   BUN 16  --   CREATININE 1.05  --   CALCIUM 9.0  --   MG  --  1.7   Liver Function Tests No results for input(s): "AST", "ALT", "ALKPHOS", "BILITOT", "PROT", "ALBUMIN" in the last 72 hours. No results for input(s): "LIPASE", "AMYLASE" in the last 72 hours. Cardiac Enzymes No results for input(s): "CKTOTAL", "CKMB", "CKMBINDEX", "TROPONINI" in the last 72 hours. BNP Invalid input(s): "POCBNP" D-Dimer No results for input(s): "DDIMER" in the last 72 hours. Hemoglobin A1C No results for input(s): "HGBA1C" in the  last 72 hours. Fasting Lipid Panel No results for input(s): "CHOL", "HDL", "LDLCALC", "TRIG", "CHOLHDL", "LDLDIRECT" in the last 72 hours. Thyroid Function Tests No results for input(s): "TSH", "T4TOTAL", "T3FREE", "THYROIDAB" in the last 72 hours.  Invalid input(s): "FREET3" _____________  ECHOCARDIOGRAM COMPLETE  Result Date: 10/26/2022    ECHOCARDIOGRAM REPORT   Patient Name:   Cameron Patton Date of Exam: 10/26/2022 Medical Rec #:  [id number]     Height:       69.0 in Accession #:    [id number]    Weight:       152.8 lb Date of Birth:  April 17, 1946     BSA:          1.843 m Patient Age:    36 years      BP:           137/87 mmHg Patient Gender: M             HR:           65 bpm. Exam  Location:  Inpatient Procedure: 2D Echo, 3D Echo, Color Doppler and Cardiac Doppler Indications:     Post Mitral TEER  History:         Patient has prior history of Echocardiogram examinations, most                  recent 10/25/2022. Risk Factors:ETOH.  Sonographer:     Raquel Sarna Senior RDCS Referring Phys:  Alphonsa Overall MCDANIEL Diagnosing Phys: Rudean Haskell MD IMPRESSIONS  1. Left ventricular ejection fraction, by estimation, is 55%. The left ventricle has normal function. The left ventricle has no regional wall motion abnormalities. Left ventricular diastolic parameters are indeterminate. There is the interventricular septum is flattened in systole and diastole, consistent with right ventricular pressure and volume overload.  2. Right ventricular systolic function is low normal. The right ventricular size is moderately enlarged. There is mildly elevated pulmonary artery systolic pressure. The estimated right ventricular systolic pressure is 123456 mmHg.  3. Left atrial size was severely dilated.  4. Right atrial size was severely dilated.  5. The mitral valve has been repaired with three PASCAL ACE devices. Attachmen is preserved.  6. Mild to moderate mitral valve regurgitation, see, at worst, in the [employer]. The mean mitral valve gradient is 5.0 mmHg with average heart rate of 74 bpm.  7. Tricuspid valve regurgitation is moderate.  8. The aortic valve is abnormal. Aortic valve regurgitation is not visualized. No aortic stenosis is present.  9. The inferior vena cava is dilated in size with <50% respiratory variability, suggesting right atrial pressure of 15 mmHg. 10. Evidence of atrial level shunting detected by color flow Doppler. Post procedure there are two residual ASDs. FINDINGS  Left Ventricle: Left ventricular ejection fraction, by estimation, is 55%. The left ventricle has normal function. The left ventricle has no regional wall motion abnormalities. The left ventricular internal cavity size was normal in  size. There is no left ventricular hypertrophy. The interventricular septum is flattened in systole and diastole, consistent with right ventricular pressure and volume overload. Left ventricular diastolic parameters are indeterminate. Right Ventricle: The right ventricular size is moderately enlarged. Right vetricular wall thickness was not well visualized. Right ventricular systolic function is low normal. There is mildly elevated pulmonary artery systolic pressure. The tricuspid regurgitant velocity is 2.73 m/s, and with an assumed right atrial pressure of 15 mmHg, the estimated right ventricular systolic pressure is 123456 mmHg. Left Atrium: Left atrial size was  severely dilated. Right Atrium: Right atrial size was severely dilated. Pericardium: There is no evidence of pericardial effusion. Mitral Valve: Normal right sided pulmoanry vein signal. The mitral valve has been repaired/replaced. Mild to moderate mitral valve regurgitation. MV peak gradient, 11.6 mmHg. The mean mitral valve gradient is 5.0 mmHg with average heart rate of 74 bpm. Tricuspid Valve: The tricuspid valve is normal in structure. Tricuspid valve regurgitation is moderate . No evidence of tricuspid stenosis. Aortic Valve: The aortic valve is abnormal. Aortic valve regurgitation is not visualized. No aortic stenosis is present. Pulmonic Valve: The pulmonic valve was normal in structure. Pulmonic valve regurgitation is not visualized. No evidence of pulmonic stenosis. Aorta: The aortic root is normal in size and structure. Venous: The inferior vena cava is dilated in size with less than 50% respiratory variability, suggesting right atrial pressure of 15 mmHg. IAS/Shunts: Evidence of atrial level shunting detected by color flow Doppler.  LEFT VENTRICLE PLAX 2D LVIDd:         4.40 cm LVIDs:         3.30 cm LV PW:         1.00 cm LV IVS:        0.90 cm LVOT diam:     2.20 cm LV SV:         63 LV SV Index:   34 LVOT Area:     3.80 cm  RIGHT VENTRICLE  RV S prime:     6.96 cm/s TAPSE (M-mode): 1.0 cm LEFT ATRIUM              Index         RIGHT ATRIUM           Index LA diam:        6.20 cm  3.36 cm/m    RA Area:     31.70 cm LA Vol (A2C):   194.0 ml 105.29 ml/m  RA Volume:   97.00 ml  52.64 ml/m LA Vol (A4C):   161.0 ml 87.38 ml/m LA Biplane Vol: 178.0 ml 96.61 ml/m  AORTIC VALVE LVOT Vmax:   102.70 cm/s LVOT Vmean:  62.367 cm/s LVOT VTI:    0.165 m  AORTA Ao Root diam: 2.90 cm Ao Asc diam:  3.20 cm MITRAL VALVE                  TRICUSPID VALVE MV Area (PHT): 1.24 cm       TR Peak grad:   29.8 mmHg MV Area VTI:   1.17 cm       TR Vmax:        273.00 cm/s MV Peak grad:  11.6 mmHg MV Mean grad:  5.0 mmHg       SHUNTS MV Vmax:       1.70 m/s       Systemic VTI:  0.16 m MV Vmean:      92.2 cm/s      Systemic Diam: 2.20 cm MR Peak grad:    96.8 mmHg MR Mean grad:    68.0 mmHg MR Vmax:         492.00 cm/s MR Vmean:        401.0 cm/s MR PISA:         0.57 cm MR PISA Eff ROA: 5 mm MR PISA Radius:  0.30 cm Rudean Haskell MD Electronically signed by Rudean Haskell MD Signature Date/Time: 10/26/2022/11:19:22 AM    Final    ECHO TEE  Result Date: 10/25/2022  TRANSESOPHOGEAL ECHO REPORT   Patient Name:   Cameron Patton Date of Exam: 10/25/2022 Medical Rec #:  [id number]     Height:       69.0 in Accession #:    [id number]    Weight:       162.0 lb Date of Birth:  1945/09/27     BSA:          1.889 m Patient Age:    12 years      BP:           162/86 mmHg Patient Gender: M             HR:           90 bpm. Exam Location:  Inpatient Procedure: Transesophageal Echo, 3D Echo, Color Doppler and Cardiac Doppler Indications:     Mitral Regurgitation i34.0  History:         Patient has prior history of Echocardiogram examinations, most                  recent 05/30/2022. Arrythmias:Atrial Fibrillation; Risk                  Factors:ETOH.  Sonographer:     Raquel Sarna Senior RDCS Referring Phys:  [id number] Early Osmond Diagnosing Phys: Rudean Haskell MD   Sonographer Comments: Mitral TEER with 3 Pascal clips PROCEDURE: After discussion of the risks and benefits of a TEE, an informed consent was obtained from the patient. The transesophogeal probe was passed without difficulty through the esophogus of the patient. Sedation performed by different physician. The patient was monitored while under deep sedation. The patient developed no complications during the procedure.  IMPRESSIONS  1. Prior to procedure there was severe mitral valve regurgitation. There was rare systolic flow reversal of the right sided pulmonary veins. 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 There wer multiple jets with the largest being medial ~ A3/P3. Left ventricular ejection fraction, by estimation, is 60 to 65%. The left ventricle has normal function. Mean gradient of 1 mm Hg with normal 3D MVA.  2. A transeptal puncture was performed by with an overly posterior approach. A more mid-mid approach was performed and but prior to guide moving to the left atrium clot was seen. System was removed and the mid-mid approach was performed.  3. A Pascal ACE was placed in the medial A3/P3. This lateralized the rest of the regurgitation. Mean gradient of 1 mm Hg.  4. A second Pascal ACE awas placed A2/P2 next to the original device. Mild to modreate mitral regurgitation, MVA 6 cm2 with mean gradient of 9m.  5. A third Pascal ACE was placed lateral to the prior two. Trace MR. Mean gradient of 3.5 mm Hg.  6. No change in pericardial effusion.  7. Right ventricular systolic function is normal. The right ventricular size is moderately enlarged.  8. Left atrial size was severely dilated. No left atrial/left atrial appendage thrombus was detected.  9. Right atrial size was severely dilated. 10. Tricuspid valve regurgitation is mild to moderate. 11. The aortic valve is tricuspid. Aortic valve regurgitation is not visualized. No aortic stenosis is present. 12. There is Moderate (Grade III)  plaque involving the descending aorta. 13. Evidence of atrial level shunting detected by color flow Doppler. Conclusion(s)/Recommendation(s): Successful placement of 3 Pascal devices. FINDINGS  Left Ventricle: Left ventricular ejection fraction, by estimation, is 60 to 65%. The left ventricle has normal function. The left ventricular internal cavity  size was normal in size. Right Ventricle: The right ventricular size is moderately enlarged. Right vetricular wall thickness was not well visualized. Right ventricular systolic function is normal. Left Atrium: Left atrial size was severely dilated. No left atrial/left atrial appendage thrombus was detected. Right Atrium: Right atrial size was severely dilated. Pericardium: Trivial pericardial effusion is present. The pericardial effusion is surrounding the apex. Mitral Valve: The mitral valve has been repaired/replaced. Trivial mitral valve regurgitation. MV peak gradient, 4.4 mmHg. The mean mitral valve gradient is 1.3 mmHg. Tricuspid Valve: The tricuspid valve is normal in structure. Tricuspid valve regurgitation is mild to moderate. No evidence of tricuspid stenosis. Aortic Valve: The aortic valve is tricuspid. Aortic valve regurgitation is not visualized. No aortic stenosis is present. Pulmonic Valve: The pulmonic valve was grossly normal. Pulmonic valve regurgitation is trivial. Aorta: The aortic root and ascending aorta are structurally normal, with no evidence of dilitation. There is moderate (Grade III) plaque involving the descending aorta. IAS/Shunts: Evidence of atrial level shunting detected by color flow Doppler. Additional Comments: Spectral Doppler performed. AORTIC VALVE LVOT Vmax:   56.50 cm/s LVOT Vmean:  36.800 cm/s LVOT VTI:    0.116 m MITRAL VALVE MV Peak grad: 4.4 mmHg        SHUNTS MV Mean grad: 1.3 mmHg        Systemic VTI: 0.12 m MV Vmax:      1.05 m/s MV Vmean:     50.6 cm/s MR Peak grad:    135.0 mmHg MR Mean grad:    85.0 mmHg MR Vmax:          581.00 cm/s MR Vmean:        430.0 cm/s MR PISA:         2.65 cm MR PISA Eff ROA: 18 mm MR PISA Radius:  0.65 cm Rudean Haskell MD Electronically signed by Rudean Haskell MD Signature Date/Time: 10/25/2022/1:14:24 PM    Final    CARDIAC CATHETERIZATION  Result Date: 10/25/2022  Successful mitral transcatheter edge-to-edge repair with 3 PASCAL ACE devices reducing severe to trace MR with residual mean gradient of 68mmHg (at HR of 50bpm).   EP STUDY  Result Date: 10/25/2022  Successful mitral transcatheter edge-to-edge repair with 3 PASCAL ACE devices reducing severe to trace MR with residual mean gradient of 65mmHg (at HR of 50bpm).   DG Chest 2 View  Result Date: 10/22/2022 CLINICAL DATA:  Preoperative evaluation for MVR EXAM: CHEST - 2 VIEW COMPARISON:  05/31/2020 FINDINGS: Enlargement of cardiac silhouette. Mediastinal contours and pulmonary vascularity normal. Mild chronic elevation of LEFT diaphragm. Lungs otherwise clear. No acute infiltrate, pleural effusion, or pneumothorax. Osseous structures unremarkable. IMPRESSION: Enlargement of cardiac silhouette. No acute abnormalities. Electronically Signed   By: Lavonia Dana M.D.   On: 10/22/2022 10:08   Disposition   Pt is being discharged home today in good condition.  Follow-up Plans & Appointments    Follow-up Information     Tommie Raymond, NP Follow up on 11/02/2022.   Specialty: Cardiology Why: @ 10am. Please arrive to your appointment at 9:45am. Contact information: [place] [address] [phone]                Discharge Instructions     Amb Referral to Cardiac Rehabilitation   Complete by: As directed    Diagnosis: Valve Repair   Valve: Mitral   After initial evaluation and assessments completed: Virtual Based Care may be provided alone or in conjunction  with Phase 2 Cardiac Rehab based on patient barriers.: Yes   Intensive Cardiac Rehabilitation ([place]) [place] location only OR  Traditional Cardiac Rehabilitation (TCR) *If criteria for ICR are not met will enroll in [facility] only): Yes   Call MD for:  difficulty breathing, headache or visual disturbances   Complete by: As directed    Call MD for:  extreme fatigue   Complete by: As directed    Call MD for:  hives   Complete by: As directed    Call MD for:  persistant dizziness or light-headedness   Complete by: As directed    Call MD for:  persistant nausea and vomiting   Complete by: As directed    Call MD for:  redness, tenderness, or signs of infection (pain, swelling, redness, odor or green/yellow discharge around incision site)   Complete by: As directed    Call MD for:  severe uncontrolled pain   Complete by: As directed    Call MD for:  temperature >100.4   Complete by: As directed    Diet - low sodium heart healthy   Complete by: As directed    Discharge instructions   Complete by: As directed    Home Care Following Your MitraClip Procedure      If you have any questions or concerns you can call the structural heart office at [phone] during normal business hours 8am-4pm. If you have an urgent need after hours or on the weekend, please call [phone] to talk to the on call provider for general cardiology. If you have an emergency that requires immediate attention, please call 911.   Groin Site Care Refer to this sheet in the next few weeks. These instructions provide you with information on caring for yourself after your procedure. Your caregiver may also give you more specific instructions. Your treatment has been planned according to current medical practices, but problems sometimes occur. Call your caregiver if you have any problems or questions after your procedure. HOME CARE INSTRUCTIONS You may shower 24 hours after the procedure. Remove the bandage (dressing) and gently wash the site with plain soap and water. Gently pat the site dry.  Do not apply powder or lotion to the site.  Do not  sit in a bathtub, swimming pool, or whirlpool for 5 to 7 days.  No bending, squatting, or lifting anything over 10 pounds (4.5 kg) as directed by your caregiver.  Inspect the site at least twice daily.  Do not drive home if you are discharged the same day of the procedure. Have someone else drive you.  You may drive 72 hours after the procedure unless otherwise instructed by your caregiver.  What to expect: Any bruising will usually fade within 1 to 2 weeks.  Blood that collects in the tissue (hematoma) may be painful to the touch. It should usually decrease in size and tenderness within 1 to 2 weeks.  SEEK IMMEDIATE MEDICAL CARE IF: You have unusual pain at the groin site or down the affected leg.  You have redness, warmth, swelling, or pain at the groin site.  You have drainage (other than a small amount of blood on the dressing).  You have chills.  You have a fever or persistent symptoms for more than 72 hours.  You have a fever and your symptoms suddenly get worse.  Your leg becomes pale, cool, tingly, or numb.  You have bleeding from the site. Hold pressure on the site until it subsides.    After  MitraClip Checklist  Check  Test Description  Follow up appointment in 1-2 weeks  Most of our patients will see our structural heart APP or your primary cardiologist within 1-2 weeks. Your incision site will be checked and you will be cleared to resume all normal activities if you are doing well.    1 month echo and follow up  You will have an echo to check on your heart valve clip and be seen back in the office by our structural heart APP  Follow up with your primary cardiologist You will need to be seen by your primary cardiologist in the following 3-6 months after your 1 month appointment in the valve clinic. Often times your blood thinners will be changed. This is decided on a case by case basis.   1 year echo and follow up You will have another echo to check on your heart valve after one  year and be seen back in the office by our structural heart APP. This your last structural heart visit.  Bacterial endocarditis prophylaxis  You will have to take antibiotics for the rest of your life before all dental procedures (even dental cleanings) to protect your heart valve from potential infection. Antibiotics are also required before some surgeries. Please check with your cardiologist before scheduling any surgeries. Also, please make sure to tell us if you have a penicillin allergy as you will require an alternative antibiotic.   ______________  Your Implant Identification Card Following your procedure, you will receive an Implant Identification Card, which your doctor will fill out and which you must carry with you at all times. Show your Implant Identification Card if you report to an emergency room. This card identifies you as a patient who has had a MitraClip device implanted. If you require a magnetic resonance imaging (MRI) scan, tell your doctor or MRI technician that you have a MitraClip device implanted. Test results indicate that patients with the MitraClip device can safely undergo MRI scans under certain conditions described on the card.   Increase activity slowly   Complete by: As directed       Discharge Medications   Allergies as of 10/26/2022   No Known Allergies      Medication List     STOP taking these medications    ibuprofen 200 MG tablet Commonly known as: ADVIL       TAKE these medications    apixaban 5 MG Tabs tablet Commonly known as: ELIQUIS Take 1 tablet (5 mg total) by mouth 2 (two) times daily.   B-complex with vitamin C tablet Take 1 tablet by mouth daily.   COLD & COUGH DAYTIME PO Take 1 tablet by mouth daily.   digoxin 0.125 MG tablet Commonly known as: LANOXIN Take 1 tablet (0.125 mg total) by mouth daily.   furosemide 20 MG tablet Commonly known as: LASIX Take 1 tablet (20 mg total) by mouth daily. Start taking on: October 27, 2022   metoprolol succinate 25 MG 24 hr tablet Commonly known as: TOPROL-XL Take 0.5 tablets (12.5 mg total) by mouth at bedtime.   SLEEP AID PO Take 1-2 tablets by mouth at bedtime as needed (sleep).       Outstanding Labs/Studies   BMET, Mg+  Duration of Discharge Encounter   Greater than 30 minutes including physician time.  Signed, Kathyrn Drown, NP 10/26/2022, 1:17 PM [phone]   ATTENDING ATTESTATION:  After conducting a review of all available clinical information with the care team, interviewing the  patient, and performing a physical exam, I agree with the findings and plan described in this note.   GEN: No acute distress.   HEENT:  MMM, no JVD, no scleral icterus Cardiac: RRR with occ ectopy, with short holosystolic murmur Respiratory: Clear to auscultation bilaterally. GI: Soft, nontender, non-distended  MS: No edema; No deformity. Neuro:  Nonfocal  Vasc:  +2 radial pulses; right common femoral venous site clean dry and intact  The patient is doing well after uncomplicated mitral transcatheter edge-to-edge repair with 3 Pascal ACE devices with resultant mild mitral regurgitation.  Will start Lasix 20 mg twice daily and follow electrolytes.  Discharge today with close hospital follow-up.  Lenna Sciara, MD Pager [phone]

## 2022-10-29 ENCOUNTER — Telehealth: Payer: Self-pay

## 2022-10-29 NOTE — Transitions of Care (Post Inpatient/ED Visit) (Signed)
   10/29/2022  Name: Cameron Patton MRN: [id number] DOB: 09/29/1945  Today's TOC FU Call Status: Today's TOC FU Call Status:: Successful TOC FU Call Competed TOC FU Call Complete Date: 10/29/22  Transition Care Management Follow-up Telephone Call Date of Discharge: 10/26/22 Dis[facility]) Type of Discharge: Inpatient Admission Primary Inpatient Discharge Diagnosis:: "severe mitral valve regurgitation" How have you been since you were released from the hospital?: Better Any questions or concerns?: Yes Patient Questions/Concerns:: Patient states he did not pick up new meds-Lasix and Toprol. When he took fisrt dose of Lasix in hospital-it caused diarrhea. He wishes to speak with cardiologist regarding why he needs to take these meds before picking them up from the pharamcy. He vocies that it was not explained to him while he was in the hospital why meds needed or the issues with his heart. Patient Questions/Concerns Addressed: Other: (RN CM explaiend to patient purpose of meds. He will call cardiology office to discuss further.)  Items Reviewed: Did you receive and understand the discharge instructions provided?: Yes Medications obtained and verified?: Yes (Medications Reviewed) Any new allergies since your discharge?: No Dietary orders reviewed?: Yes Type of Diet Ordered:: low salt/heart healthy Do you have support at home?: No  Home Care and Equipment/Supplies: [place] Ordered?: NA Any new equipment or medical supplies ordered?: NA  Functional Questionnaire: Do you need assistance with bathing/showering or dressing?: No Do you need assistance with meal preparation?: No Do you need assistance with eating?: No Do you have difficulty maintaining continence: No Do you need assistance with getting out of bed/getting out of a chair/moving?: No Do you have difficulty managing or taking your medications?: No  Folllow up appointments reviewed: PCP  Follow-up appointment confirmed?: No MD Provider Line Number:[phone] Given: No Specialist Hospital Follow-up appointment confirmed?: Yes Date of Specialist follow-up appointment?: 11/02/22 Follow-Up Specialty Provider:: Sharee Pimple McDaniel,NP Do you need transportation to your follow-up appointment?: No Do you understand care options if your condition(s) worsen?: Yes-patient verbalized understanding  SDOH Interventions Today    Flowsheet Row Most Recent Value  SDOH Interventions   Food Insecurity Interventions Intervention Not Indicated  Transportation Interventions Intervention Not Indicated       TOC Interventions Today    Flowsheet Row Most Recent Value  TOC Interventions   TOC Interventions Discussed/Reviewed TOC Interventions Discussed      Interventions Today    Flowsheet Row Most Recent Value  Chronic Disease   Chronic disease during today's visit Other  [cardiac -mitral valve regurgitation]  Education Interventions   Education Provided Provided Education  Provided Verbal Education On Nutrition, Medication, When to see the doctor  Nutrition Interventions   Nutrition Discussed/Reviewed Nutrition Discussed  Pharmacy Interventions   Pharmacy Dicussed/Reviewed Pharmacy Topics Discussed, Medications and their functions        Hetty Blend [facility] Care Management Care Management Community Coordinator Direct Phone: [phone] Toll Free: [phone] Fax: [phone]

## 2022-10-29 NOTE — Telephone Encounter (Signed)
Left message to call back  

## 2022-10-29 NOTE — Telephone Encounter (Signed)
  [facility]    Contacted the patient regarding discharge from [facility] on 10/26/2022  The patient understands to follow up with Kathyrn Drown on 11/02/2022.  The patient understands discharge instructions? Yes  The patient understands medications and regimen? Yes   The patient reports groin site looks healthy with no signs or symptoms of infection or bleeding.  The patient understands to call with any questions or concerns prior to scheduled visit.   The patient reports he has not gotten his prescriptions for Toprol or Lasix as instructed at discharge and he is weary to do so. He is worried about taking two medications (Toprol and digoxin) to slow heart rate and Lasix gave him diarrhea at the hospital. After much discussion and education about the medications, the patient feels more comfortable to continue the next few days without taking Toprol and Lasix. He understands med management will be discussed at the visit on Friday with Sharee Pimple.

## 2022-11-01 NOTE — Progress Notes (Signed)
[facility]                                     Cardiology Office Note:    Date:  11/02/2022   ID:  Cameron Patton, DOB 05-07-1946, MRN [id number]  PCP:  Vivi Barrack, MD  [facility] Cardiologist:  Minus Breeding, MD / Dr. Ali Lowe, MD & Dr. Burt Knack, MD Timmothy Euler)  Referring MD: Vivi Barrack, MD   Chief Complaint  Patient presents with   Follow-up    TOC s/p TEER   History of Present Illness:    Cameron Patton is a 77 y.o. male with a hx of permanent AF on Eliquis, current ETOH abuse, frequent PVCs, and severe MR who presented to [facility] on 10/25/22 for planned TEER and is being seen today for TOC follow up.    Cameron Patton is followed by Dr. Percival Spanish for his cardiology care. He has been noted to have mitral valve disease for quite some time however in the mild to moderate range. More recently he was seen with an echocardiogram that showed MR in the severe range with dilated annulus causing a defect at medical A2/P2 with a broad jet originating from medial A2/P2. AP gap 0.3cm, anterior leaflet length 2.2cm, posterior leaflet length 1.4cm. Mean gradient 1.55mmHg. Estimated TSP height 5.1cm. Subsequent TEE performed 05/30/22 which confirmed severe MR with an LVEF 60-65%, MV area: 7.1cm2. Mean gradient: 1.62mmHg, Peak gradient: 3.27mmHg, Jet: Central. MR PISA radius: 0.9cm, ERO 0.33 cm2, R vol 65 cc, RF 57%, 3D VCA 0.50 cm2 (oval shape).    In consultation with Dr. Ali Lowe he reported mild symptoms but when questioned further, he stated issues with walking his dogs, often needing frequent stops. Plan was an exercise tolerance test which showed overall mild to moderate risk exercise treadmill test mostly secondary to decreased exercise effort felt to be secondary to severe MR.    The patient was then evaluated by the multidisciplinary valve team and felt to have severe, symptomatic mitral regurgitation and to be a suitable candidate. He is now s/p  successful transcatheter edge-to-edge mitral valve repair under fluoroscopic and echo guidance, reducing baseline 4+ mitral regurgitation to 1+, with placement of 3 Pascal ACE devices, positioned A3/P3, A2/P2, and lateral A2/P2. Final mean transmitral gradient 3 mmHg. POD1 echocardiogram showed normal LVEF at 55% with mildly elevated PASP at 44.53mmHg, PASCAL ACE devices present with mild to moderate MR post implant and moderate TR with atrial shunting with two residual ASDs noted. He was started on low dose Lasix given moderate TR however he deferred this at discharge due to "diarrhea".   Today he is here alone and reports that he has been well since discharge. He feels that his energy is improved. He has not walked his dogs much to see if this is better. He did not start the Toprol and in fact his BP and HR looks great today without it. He is actually running in the mid 50's bpm. He denies chest pain, dizziness, syncope, palpitations, LE edema, orthopnea, LE edema, or bleeding in urine or stool.   Past Medical History:  Diagnosis Date   Allergic rhinitis    Atrial fibrillation (HCC)    Depression    Dysrhythmia    History of ETOH abuse    Hx of degenerative disc disease    S/P mitral valve clip implantation  10/25/2022   3x PASCAL ACE implantation with Dr. Ali Lowe and Dr. Burt Knack    Past Surgical History:  Procedure Laterality Date   ABDOMINAL AORTOGRAM N/A 07/18/2022   Procedure: ABDOMINAL AORTOGRAM;  Surgeon: Cameron Booze, MD;  Location: [place] [facility];  Service: Cardiovascular;  Laterality: N/A;   BUBBLE STUDY  05/30/2022   Procedure: BUBBLE STUDY;  Surgeon: Geralynn Rile, MD;  Location: [place];  Service: Cardiovascular;;   CATARACT EXTRACTION BILATERAL W/ ANTERIOR VITRECTOMY Bilateral 2015   INGUINAL HERNIA REPAIR Right 1982   MITRAL VALVE REPAIR N/A 10/25/2022   Procedure: MITRAL VALVE REPAIR;  Surgeon: Early Osmond, MD;  Location: [place] [facility];   Service: Cardiovascular;  Laterality: N/A;   RIGHT/LEFT HEART CATH AND CORONARY ANGIOGRAPHY N/A 07/18/2022   Procedure: RIGHT/LEFT HEART CATH AND CORONARY ANGIOGRAPHY;  Surgeon: Cameron Booze, MD;  Location: [place] [facility];  Service: Cardiovascular;  Laterality: N/A;   TEE WITHOUT CARDIOVERSION N/A 05/30/2022   Procedure: TRANSESOPHAGEAL ECHOCARDIOGRAM (TEE);  Surgeon: Geralynn Rile, MD;  Location: [place];  Service: Cardiovascular;  Laterality: N/A;   TEE WITHOUT CARDIOVERSION N/A 10/25/2022   Procedure: TRANSESOPHAGEAL ECHOCARDIOGRAM;  Surgeon: Early Osmond, MD;  Location: [place] [facility];  Service: Cardiovascular;  Laterality: N/A;    Current Medications: Current Meds  Medication Sig   Acetaminophen-DM (COLD & COUGH DAYTIME PO) Take 1 tablet by mouth daily.   apixaban (ELIQUIS) 5 MG TABS tablet Take 1 tablet (5 mg total) by mouth 2 (two) times daily.   B Complex-C (B-COMPLEX WITH VITAMIN C) tablet Take 1 tablet by mouth daily.   digoxin (LANOXIN) 0.125 MG tablet Take 1 tablet (0.125 mg total) by mouth daily.   Doxylamine Succinate, Sleep, (SLEEP AID PO) Take 1-2 tablets by mouth at bedtime as needed (sleep).     Allergies:   Patient has no known allergies.   Social History   Socioeconomic History   Marital status: Single    Spouse name: Not on file   Number of children: 2   Years of education: Not on file   Highest education level: Not on file  Occupational History   Occupation: Retired   Tobacco Use   Smoking status: Never   Smokeless tobacco: Never  Vaping Use   Vaping Use: Never used  Substance and Sexual Activity   Alcohol use: Yes    Alcohol/week: 13.0 standard drinks of alcohol    Types: 8 Cans of beer, 5 Shots of liquor per week    Comment: Daily (4-5 beers, 1-2 shots - 4-5 days a week)   Drug use: Never   Sexual activity: Yes    Partners: Female  Other Topics Concern   Not on file  Social History Narrative   ** Merged History  Encounter **       Relocated to [place] from [place] 08/2015 months ago after significant hospital stay with UTI leading to sepsis. Went to rehab for a few months afterwards. Sister in law only family in [place]  Son lives in [place] Daughter lives in [place]    (addiction counselor)    Social Determinants of Health   Financial Resource Strain: Low Risk  (11/03/2020)   Overall Financial Resource Strain (CARDIA)    Difficulty of Paying Living Expenses: Not hard at all  Food Insecurity: No Food Insecurity (10/29/2022)   Hunger Vital Sign    Worried About Running Out of Food in the Last Year: Never true    Ran  Out of Food in the Last Year: Never true  Transportation Needs: No Transportation Needs (10/29/2022)   PRAPARE - Hydrologist (Medical): No    Lack of Transportation (Non-Medical): No  Physical Activity: Insufficiently Active (11/03/2020)   Exercise Vital Sign    Days of Exercise per Week: 4 days    Minutes of Exercise per Session: 30 min  Stress: No Stress Concern Present (11/03/2020)   [place]    Feeling of Stress : Only a little  Social Connections: Socially Isolated (11/03/2020)   Social Connection and Isolation Panel [NHANES]    Frequency of Communication with Friends and Family: Twice a week    Frequency of Social Gatherings with Friends and Family: Once a week    Attends Religious Services: Never    Marine scientist or Organizations: No    Attends Music therapist: Never    Marital Status: Never married    Family History: The patient's family history includes Alcoholism in his mother; Cancer in his brother; Stroke in his brother.  ROS:   Please see the history of present illness.    All other systems reviewed and are negative.  EKGs/Labs/Other Studies Reviewed:    The following studies were reviewed today:  TEER 10/26/22:   CONCLUSION:  Successful transcatheter edge-to-edge mitral valve repair under fluoroscopic and echo guidance, reducing baseline 4+ mitral regurgitation to 1+, with placement of 3 Pascal ACE devices, positioned A3/P3, A2/P2, and lateral A2/P2. Final mean transmitral gradient 3 mmHg. No immediate complication.  _____________   Echo 10/26/22:    1. Left ventricular ejection fraction, by estimation, is 55%. The left  ventricle has normal function. The left ventricle has no regional wall  motion abnormalities. Left ventricular diastolic parameters are  indeterminate. There is the interventricular  septum is flattened in systole and diastole, consistent with right  ventricular pressure and volume overload.   2. Right ventricular systolic function is low normal. The right  ventricular size is moderately enlarged. There is mildly elevated  pulmonary artery systolic pressure. The estimated right ventricular  systolic pressure is 123456 mmHg.   3. Left atrial size was severely dilated.   4. Right atrial size was severely dilated.   5. The mitral valve has been repaired with three PASCAL ACE devices.  Attachmen is preserved.   6. Mild to moderate mitral valve regurgitation, see, at worst, in the [employer]. The mean mitral valve gradient is 5.0 mmHg with average heart rate  of 74 bpm.   7. Tricuspid valve regurgitation is moderate.   8. The aortic valve is abnormal. Aortic valve regurgitation is not  visualized. No aortic stenosis is present.   9. The inferior vena cava is dilated in size with <50% respiratory  variability, suggesting right atrial pressure of 15 mmHg.  10. Evidence of atrial level shunting detected by color flow Doppler. Post  procedure there are two residual ASDs.   EKG:  EKG is not ordered today.    Recent Labs: 12/05/2021: TSH 2.68 08/23/2022: NT-Pro BNP 950 10/22/2022: ALT 24; B Natriuretic Peptide 334.2 10/25/2022: BUN 16; Creatinine, Ser 1.05; Hemoglobin 11.7; Platelets 156; Potassium 4.3;  Sodium 133 10/26/2022: Magnesium 1.7  Recent Lipid Panel    Component Value Date/Time   CHOL 173 12/05/2021 1404   TRIG 71.0 12/05/2021 1404   HDL 69.60 12/05/2021 1404   CHOLHDL 2 12/05/2021 1404   VLDL 14.2 12/05/2021 1404  [place]az 89 12/05/2021 1404   Physical Exam:    VS:  BP 122/60   Pulse (!) 52   Ht 5\' 9"  (1.753 m)   Wt 160 lb 3.2 oz (72.7 kg)   SpO2 98%   BMI 23.66 kg/m     Wt Readings from Last 3 Encounters:  11/02/22 160 lb 3.2 oz (72.7 kg)  10/25/22 152 lb 12.5 oz (69.3 kg)  10/05/22 161 lb 9.6 oz (73.3 kg)    General: Well developed, well nourished, NAD Lungs:Clear to ausculation bilaterally. No wheezes, rales, or rhonchi. Breathing is unlabored. Cardiovascular: Irregularly irregular. + soft murmur Extremities: No edema.  Neuro: Alert and oriented. No focal deficits. No facial asymmetry. MAE spontaneously. Psych: Responds to questions appropriately with normal affect.    ASSESSMENT/PLAN:    Severe MR: Patient doing well with NYHA class I symptoms s/p successful transcatheter edge-to-edge mitral valve repair under fluoroscopic and echo guidance, reducing baseline 4+ mitral regurgitation to 1+, with placement of 3 Pascal ACE devices, positioned A3/P3, A2/P2, and lateral A2/P2. Final mean transmitral gradient 3 mmHg. POD1 echocardiogram normal LVEF at 55% with mildly elevated PASP at 44.9mmHg, PASCAL ACE devices present with mild to moderate MR post implant with moderate TR with atrial shunting with two residual ASDs noted. He was started on low dose Lasix due to TR however he has deferred this and reports he has not been taking. Plan none month follow up with echocardiogram. He will require lifelong dental SBE with amoxicillin. This was sent to preferred pharmacy.    Permanent atrial fibrillation with frequent PVCs: Know to have frequent PVCs. Currently taking digoxin and anticoagulated with Eliquis 5mg  BID. Toprol 12.5mg  QD added to regimen due to elevated rates in  the hospital however the patient has not been taking and in fact has a HR at 52bpm today. Given this, will defer beta blocker for now and follow at his one month visit. Likely will not need.    Alcohol abuse: CIWA protocol initiated while hospitalized with no evidence of withdrawal. Cessation discussed. Reports more motivation for sobriety.    Chronic CHF: Noted to have acute volume overload on post op echo with worsening MR/TR. Started on low dose Lasix however patient deferred and never started this. Appears euvolemic today.    Medication Adjustments/Labs and Tests Ordered: Current medicines are reviewed at length with the patient today.  Concerns regarding medicines are outlined above.  No orders of the defined types were placed in this encounter.  No orders of the defined types were placed in this encounter.   Patient Instructions  Medication Instructions:  Your physician recommends that you continue on your current medications as directed. Please refer to the Current Medication list given to you today.  *If you need a refill on your cardiac medications before your next appointment, please call your pharmacy*   Lab Work: NONE If you have labs (blood work) drawn today and your tests are completely normal, you will receive your results only by: [place] (if you have MyChart) OR A paper copy in the mail If you have any lab test that is abnormal or we need to change your treatment, we will call you to review the results.   Testing/Procedures: NONE   Follow-Up: At [facility], you and your health needs are our priority.  As part of our continuing mission to provide you with exceptional heart care, we have created designated Provider Care Teams.  These Care Teams include your primary Cardiologist (physician) and  Advanced Practice Providers (APPs -  Physician Assistants and Nurse Practitioners) who all work together to provide you with the care you need, when you need  it.  We recommend signing up for the patient portal called "MyChart".  Sign up information is provided on this After Visit Summary.  MyChart is used to connect with patients for Virtual Visits (Telemedicine).  Patients are able to view lab/test results, encounter notes, upcoming appointments, etc.  Non-urgent messages can be sent to your provider as well.   To learn more about what you can do with MyChart, go to [email or web address].    Your next appointment:   KEEP SCHEDULED FOLLOW-UP   Signed, Kathyrn Drown, NP  11/02/2022 2:26 PM    [facility]

## 2022-11-02 ENCOUNTER — Ambulatory Visit: Payer: Medicare Other | Admitting: Cardiology

## 2022-11-02 VITALS — BP 122/60 | HR 52 | Ht 69.0 in | Wt 160.2 lb

## 2022-11-02 DIAGNOSIS — Z95818 Presence of other cardiac implants and grafts: Secondary | ICD-10-CM

## 2022-11-02 DIAGNOSIS — I340 Nonrheumatic mitral (valve) insufficiency: Secondary | ICD-10-CM | POA: Diagnosis not present

## 2022-11-02 DIAGNOSIS — I4811 Longstanding persistent atrial fibrillation: Secondary | ICD-10-CM

## 2022-11-02 DIAGNOSIS — Z9889 Other specified postprocedural states: Secondary | ICD-10-CM

## 2022-11-02 DIAGNOSIS — I10 Essential (primary) hypertension: Secondary | ICD-10-CM | POA: Diagnosis not present

## 2022-11-02 DIAGNOSIS — F1010 Alcohol abuse, uncomplicated: Secondary | ICD-10-CM | POA: Diagnosis not present

## 2022-11-02 MED ORDER — AMOXICILLIN 500 MG PO CAPS: 2000.0000 mg | ORAL_CAPSULE | ORAL | 12 refills | Status: DC

## 2022-11-02 NOTE — Patient Instructions (Signed)
Medication Instructions:  Your physician recommends that you continue on your current medications as directed. Please refer to the Current Medication list given to you today.  *If you need a refill on your cardiac medications before your next appointment, please call your pharmacy*   Lab Work: NONE If you have labs (blood work) drawn today and your tests are completely normal, you will receive your results only by: MyChart Message (if you have MyChart) OR A paper copy in the mail If you have any lab test that is abnormal or we need to change your treatment, we will call you to review the results.   Testing/Procedures: NONE   Follow-Up: At [facility], you and your health needs are our priority.  As part of our continuing mission to provide you with exceptional heart care, we have created designated Provider Care Teams.  These Care Teams include your primary Cardiologist (physician) and Advanced Practice Providers (APPs -  Physician Assistants and Nurse Practitioners) who all work together to provide you with the care you need, when you need it.  We recommend signing up for the patient portal called "MyChart".  Sign up information is provided on this After Visit Summary.  MyChart is used to connect with patients for Virtual Visits (Telemedicine).  Patients are able to view lab/test results, encounter notes, upcoming appointments, etc.  Non-urgent messages can be sent to your provider as well.   To learn more about what you can do with MyChart, go to [email or web address].    Your next appointment:   KEEP SCHEDULED FOLLOW-UP 

## 2022-11-05 ENCOUNTER — Telehealth (HOSPITAL_COMMUNITY): Payer: Self-pay

## 2022-11-05 NOTE — Telephone Encounter (Signed)
Pt is not interested in the cardiac rehab program. Closed referral 

## 2022-11-08 ENCOUNTER — Telehealth: Payer: Self-pay | Admitting: Internal Medicine

## 2022-11-08 NOTE — Telephone Encounter (Signed)
Called and let patient know Dr. Dara Hoyer recommendations.  Provided the number for [facility]. He will call and ask them if he may qualify.  I told him during the application process we can supply him with samples.  He will call back to our office after he speaks w them.

## 2022-11-08 NOTE — Telephone Encounter (Signed)
Will route this to Dr. Ali Patton.  He was switched from warfarin for subtherapeutic INRs before his valve surgery.  He said he would come for INR about every 5-6 weeks and pay copay for that.    He said sometimes he is forgetting to take a second dose of Eliquis.    If warfarin and Eliquis are pretty close in results, he would prefer to go back to warfarin.    He said he watches his diet but mostly he feels he has been able to stay between 2.0 and 3.0.      He only has enough Eliquis for about 3-4 days so we may need to supply w samples either way.

## 2022-11-08 NOTE — Telephone Encounter (Signed)
Patient is calling because he would like to be switched back to warfarin.  He states he was recently switched to Eliquis, and his copay would change to $47.  The copay for warfarin is only $10, that is the reason he wants to be switched back.

## 2022-11-13 NOTE — Telephone Encounter (Signed)
I spoke w patient. He called the drug company who is sending an application by mail to him.  He is aware to fill out the patient section of the form and bring it to our office for completion.   He has a supply of Eliquis right now.  Adv to request samples when he brings it by our office.

## 2022-11-27 NOTE — Progress Notes (Signed)
[facility]   MULTIDISCIPLINARY HEART VALVE CLINIC                                     Cardiology Office Note:    Date:  11/30/2022   ID:  Cameron Patton, DOB 1945-11-27, MRN [id number]  PCP:  Ardith Dark, MD  [facility] Cardiologist:  Rollene Rotunda, MD  [facility] Electrophysiologist:  None   Referring MD: Ardith Dark, MD   Chief Complaint  Patient presents with   Follow-up    1 month s/p TEER   History of Present Illness:    Cameron Patton is a 77 y.o. male with a hx of permanent AF on Eliquis, current ETOH abuse, frequent PVCs, and severe MR who presented to [facility] on 10/25/22 for planned TEER and is being seen today for one month follow up.    Mr. Rumbold is followed by Dr. Antoine Poche for his cardiology care. He has been noted to have mitral valve disease for quite some time however in the mild to moderate range. More recently he was seen with an echocardiogram that showed MR in the severe range with dilated annulus causing a defect at medical A2/P2 with a broad jet originating from medial A2/P2. AP gap 0.3cm, anterior leaflet length 2.2cm, posterior leaflet length 1.4cm. Mean gradient 1.24mmHg. Estimated TSP height 5.1cm. Subsequent TEE performed 05/30/22 which confirmed severe MR with an LVEF 60-65%, MV area: 7.1cm2. Mean gradient: 1.53mmHg, Peak gradient: 3.76mmHg, Jet: Central. MR PISA radius: 0.9cm, ERO 0.33 cm2, R vol 65 cc, RF 57%, 3D VCA 0.50 cm2 (oval shape).    In consultation with Dr. Lynnette Caffey he reported mild symptoms but when questioned further, he stated issues with walking his dogs, often needing frequent stops. Plan was an exercise tolerance test which showed overall mild to moderate risk exercise treadmill test mostly secondary to decreased exercise effort felt to be secondary to severe MR.    The patient was then evaluated by the multidisciplinary valve team and felt to have severe, symptomatic mitral regurgitation and to be a suitable candidate. He  is now s/p successful transcatheter edge-to-edge mitral valve repair under fluoroscopic and echo guidance, reducing baseline 4+ mitral regurgitation to 1+, with placement of 3 Pascal ACE devices, positioned A3/P3, A2/P2, and lateral A2/P2. Final mean transmitral gradient 3 mmHg. POD1 echocardiogram showed normal LVEF at 55% with mildly elevated PASP at 44.56mmHg, PASCAL ACE devices present with mild to moderate MR post implant and moderate TR with atrial shunting with two residual ASDs noted. He was started on low dose Lasix given moderate TR however he deferred this at discharge due to "diarrhea".    Since he was last seen, he has been doing very well. He reports that he has more energy and has been told that his color is improved. He is really trying to focus on reducing his drinking and has been doing well with this. He is tolerating medications well. Denies chest pain, dizziness, syncope, palpitations, LE edema, orthopnea, LE edema, or bleeding in urine or stool.   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 Lynnette Caffey and Dr. Excell Seltzer  Past Surgical History:  Procedure Laterality Date   ABDOMINAL AORTOGRAM N/A 07/18/2022   Procedure: ABDOMINAL AORTOGRAM;  Surgeon: Corky Crafts, MD;  Location: [facility] INVASIVE CV LAB;  Service: Cardiovascular;  Laterality: N/A;   BUBBLE STUDY  05/30/2022   Procedure: BUBBLE STUDY;  Surgeon: Sande Rives, MD;  Location: [facility] ENDOSCOPY;  Service: Cardiovascular;;   CATARACT EXTRACTION BILATERAL W/ ANTERIOR VITRECTOMY Bilateral 2015   INGUINAL HERNIA REPAIR Right 1982   MITRAL VALVE REPAIR N/A 10/25/2022   Procedure: MITRAL VALVE REPAIR;  Surgeon: Orbie Pyo, MD;  Location: [facility] INVASIVE CV LAB;  Service: Cardiovascular;  Laterality: N/A;    RIGHT/LEFT HEART CATH AND CORONARY ANGIOGRAPHY N/A 07/18/2022   Procedure: RIGHT/LEFT HEART CATH AND CORONARY ANGIOGRAPHY;  Surgeon: Corky Crafts, MD;  Location: [facility] INVASIVE CV LAB;  Service: Cardiovascular;  Laterality: N/A;   TEE WITHOUT CARDIOVERSION N/A 05/30/2022   Procedure: TRANSESOPHAGEAL ECHOCARDIOGRAM (TEE);  Surgeon: Sande Rives, MD;  Location: [facility] ENDOSCOPY;  Service: Cardiovascular;  Laterality: N/A;   TEE WITHOUT CARDIOVERSION N/A 10/25/2022   Procedure: TRANSESOPHAGEAL ECHOCARDIOGRAM;  Surgeon: Orbie Pyo, MD;  Location: [facility] INVASIVE CV LAB;  Service: Cardiovascular;  Laterality: N/A;    Current Medications: Current Meds  Medication Sig   amoxicillin (AMOXIL) 500 MG capsule Take 4 capsules (2,000 mg total) by mouth as directed. Take 4 tablets 1 hour prior to dental work, including cleanings.   apixaban (ELIQUIS) 5 MG TABS tablet Take 1 tablet (5 mg total) by mouth 2 (two) times daily.   B Complex-C (B-COMPLEX WITH VITAMIN C) tablet Take 1 tablet by mouth daily.   digoxin (LANOXIN) 0.125 MG tablet Take 1 tablet (0.125 mg total) by mouth daily.     Allergies:   Patient has no known allergies.   Social History   Socioeconomic History   Marital status: Single    Spouse name: Not on file   Number of children: 2   Years of education: Not on file   Highest education level: Not on file  Occupational History   Occupation: Retired   Tobacco Use   Smoking status: Never   Smokeless tobacco: Never  Vaping Use   Vaping Use: Never used  Substance and Sexual Activity   Alcohol use: Yes    Alcohol/week: 13.0 standard drinks of alcohol    Types: 8 Cans of beer, 5 Shots of liquor per week    Comment: Daily (4-5 beers, 1-2 shots - 4-5 days a week)   Drug use: Never   Sexual activity: Yes    Partners: Female  Other Topics Concern   Not on file  Social History Narrative   ** Merged History Encounter **       Relocated to [place] from [place] 08/2015  months ago after significant hospital stay with UTI leading to sepsis. Went to rehab for a few months afterwards. Sister in law only family in [place]  Son lives in [place] Daughter lives in [place]    (addiction counselor)    Social Determinants of Health   Financial Resource Strain: Low Risk  (11/03/2020)   Overall Financial Resource Strain (CARDIA)    Difficulty of Paying Living Expenses: Not hard at all  Food Insecurity: No Food Insecurity (10/29/2022)   Hunger Vital Sign    Worried About Running Out of Food in the Last Year: Never true    Ran Out of Food in the Last Year: Never true  Transportation Needs: No Transportation Needs (10/29/2022)   PRAPARE -  Administrator, Civil Service (Medical): No    Lack of Transportation (Non-Medical): No  Physical Activity: Insufficiently Active (11/03/2020)   Exercise Vital Sign    Days of Exercise per Week: 4 days    Minutes of Exercise per Session: 30 min  Stress: No Stress Concern Present (11/03/2020)   [employer] of Occupational Health - Occupational Stress Questionnaire    Feeling of Stress : Only a little  Social Connections: Socially Isolated (11/03/2020)   Social Connection and Isolation Panel [NHANES]    Frequency of Communication with Friends and Family: Twice a week    Frequency of Social Gatherings with Friends and Family: Once a week    Attends Religious Services: Never    Database administrator or Organizations: No    Attends Engineer, structural: Never    Marital Status: Never married     Family History: The patient's family history includes Alcoholism in his mother; Cancer in his brother; Stroke in his brother.  ROS:   Please see the history of present illness.    All other systems reviewed and are negative.  EKGs/Labs/Other Studies Reviewed:    The following studies were reviewed today:  Echocardiogram 11/30/22:   1. Left ventricular ejection fraction, by estimation, is 50 to 55%.  Left  ventricular ejection fraction by 3D volume is 50 %. The left ventricle has  low normal function. The left ventricle has no regional wall motion  abnormalities. Left ventricular  diastolic parameters are indeterminate.   2. Right ventricular systolic function is normal. The right ventricular  size is normal. There is moderately elevated pulmonary artery systolic  pressure. The estimated right ventricular systolic pressure is 58.3 mmHg.   3. Left atrial size was severely dilated.   4. Left to right shunt (atrial septal puncture site).   5. Mitra-Clip 3x PASCAL ACE . The mitral valve is normal in structure.  Moderate mitral valve regurgitation. No evidence of mitral stenosis. The  mean mitral valve gradient is 6.7 mmHg. There is a Mitra-Clip 3x PASCAL  ACE present in the mitral position.  Procedure Date: 10/25/22.   6. Tricuspid valve regurgitation is moderate.   7. The aortic valve is normal in structure. Aortic valve regurgitation is  not visualized. No aortic stenosis is present.   8. Pulmonic valve regurgitation is moderate.   9. The inferior vena cava is dilated in size with <50% respiratory  variability, suggesting right atrial pressure of 15 mmHg.   Comparison(s): Prior images reviewed side by side.   TEER 10/26/22:   CONCLUSION: Successful transcatheter edge-to-edge mitral valve repair under fluoroscopic and echo guidance, reducing baseline 4+ mitral regurgitation to 1+, with placement of 3 Pascal ACE devices, positioned A3/P3, A2/P2, and lateral A2/P2. Final mean transmitral gradient 3 mmHg. No immediate complication.  _____________   Echo 10/26/22:    1. Left ventricular ejection fraction, by estimation, is 55%. The left  ventricle has normal function. The left ventricle has no regional wall  motion abnormalities. Left ventricular diastolic parameters are  indeterminate. There is the interventricular  septum is flattened in systole and diastole, consistent with right   ventricular pressure and volume overload.   2. Right ventricular systolic function is low normal. The right  ventricular size is moderately enlarged. There is mildly elevated  pulmonary artery systolic pressure. The estimated right ventricular  systolic pressure is 44.8 mmHg.   3. Left atrial size was severely dilated.   4. Right atrial size was severely  dilated.   5. The mitral valve has been repaired with three PASCAL ACE devices.  Attachmen is preserved.   6. Mild to moderate mitral valve regurgitation, see, at worst, in the [employer] The mean mitral valve gradient is 5.0 mmHg with average heart rate  of 74 bpm.   7. Tricuspid valve regurgitation is moderate.   8. The aortic valve is abnormal. Aortic valve regurgitation is not  visualized. No aortic stenosis is present.   9. The inferior vena cava is dilated in size with <50% respiratory  variability, suggesting right atrial pressure of 15 mmHg.  10. Evidence of atrial level shunting detected by color flow Doppler. Post  procedure there are two residual ASDs.    EKG:  EKG is not ordered today.    Recent Labs: 12/05/2021: TSH 2.68 08/23/2022: NT-Pro BNP 950 10/22/2022: ALT 24; B Natriuretic Peptide 334.2 10/25/2022: BUN 16; Creatinine, Ser 1.05; Hemoglobin 11.7; Platelets 156; Potassium 4.3; Sodium 133 10/26/2022: Magnesium 1.7   Recent Lipid Panel    Component Value Date/Time   CHOL 173 12/05/2021 1404   TRIG 71.0 12/05/2021 1404   HDL 69.60 12/05/2021 1404   CHOLHDL 2 12/05/2021 1404   VLDL 14.2 12/05/2021 1404   LDLCALC 89 12/05/2021 1404   Physical Exam:    VS:  BP 136/76   Pulse (!) 54   Ht 5\' 9"  (1.753 m)   Wt 164 lb 3.2 oz (74.5 kg)   SpO2 93%   BMI 24.25 kg/m     Wt Readings from Last 3 Encounters:  11/30/22 164 lb 3.2 oz (74.5 kg)  11/02/22 160 lb 3.2 oz (72.7 kg)  10/25/22 152 lb 12.5 oz (69.3 kg)    General: Well developed, well nourished, NAD Lungs:Clear to ausculation bilaterally. No wheezes,  rales, or rhonchi. Breathing is unlabored. Cardiovascular: Irregularly irregular. Soft murmur Extremities: No edema. Neuro: Alert and oriented. No focal deficits. No facial asymmetry. MAE spontaneously. Psych: Responds to questions appropriately with normal affect.    ASSESSMENT/PLAN:    Severe MR: Patient doing well with NYHA class I symptoms s/p successful transcatheter edge-to-edge mitral valve repair under fluoroscopic and echo guidance, reducing baseline 4+ mitral regurgitation to 1+, with placement of 3 Pascal ACE devices, positioned A3/P3, A2/P2, and lateral A2/P2. Final mean transmitral gradient 3 mmHg. One month echo today with stable mitral clip implantation with PASCAL ACE x3 and moderate regurgitation with no evidence of mitral stenosis. Mean mitral valve gradient is 6.7 mmHg. Patient has no symptoms and is feeling very well. Plan regular follow up with Dr. Antoine Poche and our team in one year with echo. Will now require dental SBE with Amoxicillin.   Permanent atrial fibrillation with frequent PVCs: Know to have frequent PVCs. Currently taking digoxin and anticoagulated with Eliquis 5mg  BID. Toprol 12.5mg  QD added to regimen due to elevated rates in the hospital however on follow up, the patient was not taking. HR today at 54bpm. Defer any beta blocker at this time.    Alcohol abuse: Cessation discussed. Reports more motivation for sobriety. He has been reducing his alcohol intake and continues to be motivated.    Chronic CHF: Appears euvolemic today. Patient has deferred Lasix as he feels this caused diarrhea in the hospital.    Medication Adjustments/Labs and Tests Ordered: Current medicines are reviewed at length with the patient today.  Concerns regarding medicines are outlined above.  No orders of the defined types were placed in this encounter.  No orders of the defined types  were placed in this encounter.   Patient Instructions  Medication Instructions:  Your physician  recommends that you continue on your current medications as directed. Please refer to the Current Medication list given to you today.  *If you need a refill on your cardiac medications before your next appointment, please call your pharmacy*   Lab Work: NONE If you have labs (blood work) drawn today and your tests are completely normal, you will receive your results only by: MyChart Message (if you have MyChart) OR A paper copy in the mail If you have any lab test that is abnormal or we need to change your treatment, we will call you to review the results.   Testing/Procedures: NONE   Follow-Up: At [facility], you and your health needs are our priority.  As part of our continuing mission to provide you with exceptional heart care, we have created designated Provider Care Teams.  These Care Teams include your primary Cardiologist (physician) and Advanced Practice Providers (APPs -  Physician Assistants and Nurse Practitioners) who all work together to provide you with the care you need, when you need it.  We recommend signing up for the patient portal called "MyChart".  Sign up information is provided on this After Visit Summary.  MyChart is used to connect with patients for Virtual Visits (Telemedicine).  Patients are able to view lab/test results, encounter notes, upcoming appointments, etc.  Non-urgent messages can be sent to your provider as well.   To learn more about what you can do with MyChart, go to [email or web address].    Your next appointment:   RECALL  Provider:   Rollene Rotunda, MD     Signed, Georgie Chard, NP  11/30/2022 2:03 PM    [facility]

## 2022-11-30 ENCOUNTER — Other Ambulatory Visit: Payer: Self-pay | Admitting: Cardiology

## 2022-11-30 ENCOUNTER — Ambulatory Visit (HOSPITAL_BASED_OUTPATIENT_CLINIC_OR_DEPARTMENT_OTHER): Payer: Medicare Other

## 2022-11-30 ENCOUNTER — Ambulatory Visit (HOSPITAL_COMMUNITY): Payer: Medicare Other | Attending: Cardiology | Admitting: Cardiology

## 2022-11-30 VITALS — BP 136/76 | HR 54 | Ht 69.0 in | Wt 164.2 lb

## 2022-11-30 DIAGNOSIS — I340 Nonrheumatic mitral (valve) insufficiency: Secondary | ICD-10-CM | POA: Diagnosis not present

## 2022-11-30 DIAGNOSIS — Z9889 Other specified postprocedural states: Secondary | ICD-10-CM

## 2022-11-30 DIAGNOSIS — I10 Essential (primary) hypertension: Secondary | ICD-10-CM | POA: Insufficient documentation

## 2022-11-30 DIAGNOSIS — Z95818 Presence of other cardiac implants and grafts: Secondary | ICD-10-CM

## 2022-11-30 DIAGNOSIS — F1010 Alcohol abuse, uncomplicated: Secondary | ICD-10-CM | POA: Diagnosis not present

## 2022-11-30 DIAGNOSIS — I4811 Longstanding persistent atrial fibrillation: Secondary | ICD-10-CM

## 2022-11-30 LAB — ECHOCARDIOGRAM COMPLETE
Area-P 1/2: 1.8 cm2
Calc EF: 46.5 %
MV M vel: 5.82 m/s
MV Peak grad: 135.5 mmHg
MV VTI: 0.94 cm2
Radius: 0.5 cm
S' Lateral: 3.6 cm
Single Plane A2C EF: 46.9 %
Single Plane A4C EF: 49.2 %

## 2022-11-30 NOTE — Patient Instructions (Signed)
Medication Instructions:  Your physician recommends that you continue on your current medications as directed. Please refer to the Current Medication list given to you today.  *If you need a refill on your cardiac medications before your next appointment, please call your pharmacy*   Lab Work: NONE If you have labs (blood work) drawn today and your tests are completely normal, you will receive your results only by: MyChart Message (if you have MyChart) OR A paper copy in the mail If you have any lab test that is abnormal or we need to change your treatment, we will call you to review the results.   Testing/Procedures: NONE   Follow-Up: At [facility], you and your health needs are our priority.  As part of our continuing mission to provide you with exceptional heart care, we have created designated Provider Care Teams.  These Care Teams include your primary Cardiologist (physician) and Advanced Practice Providers (APPs -  Physician Assistants and Nurse Practitioners) who all work together to provide you with the care you need, when you need it.  We recommend signing up for the patient portal called "MyChart".  Sign up information is provided on this After Visit Summary.  MyChart is used to connect with patients for Virtual Visits (Telemedicine).  Patients are able to view lab/test results, encounter notes, upcoming appointments, etc.  Non-urgent messages can be sent to your provider as well.   To learn more about what you can do with MyChart, go to [email or web address].    Your next appointment:   RECALL  Provider:   Rollene Rotunda, MD

## 2022-12-25 ENCOUNTER — Encounter: Payer: Self-pay | Admitting: Internal Medicine

## 2022-12-25 ENCOUNTER — Telehealth: Payer: Self-pay | Admitting: Internal Medicine

## 2022-12-25 NOTE — Telephone Encounter (Signed)
Pt is requesting a callback regarding his implant card that he still hasn't received and he's concerned about it. Please advise.

## 2022-12-27 NOTE — Telephone Encounter (Signed)
See 12/25/22 MyChart messages. A new card has been mailed.

## 2023-01-21 ENCOUNTER — Other Ambulatory Visit: Payer: Self-pay | Admitting: Cardiology

## 2023-07-26 ENCOUNTER — Other Ambulatory Visit: Payer: Self-pay | Admitting: Internal Medicine

## 2023-07-26 DIAGNOSIS — I4811 Longstanding persistent atrial fibrillation: Secondary | ICD-10-CM

## 2023-07-26 NOTE — Telephone Encounter (Signed)
Prescription refill request for Eliquis received. Indication: Afib  Last office visit: 11/30/22 Cameron Patton)  Scr: 1.05 (10/25/22)  Age: 77 Weight: 74.5kg  Appropriate dose. Refill sent.

## 2023-09-23 ENCOUNTER — Telehealth: Payer: Self-pay | Admitting: Cardiology

## 2023-09-23 NOTE — Telephone Encounter (Signed)
 Message routed to pharmacy team and MD -- was changed from warfarin > eliquis  08/2022 by Dr. Lorie Rook d/t subtherapeutic INRs  Previously, a few weeks ago, deductible was $340. He did not pick up Rx. Previous co-pays were $47/30 or 90 day supply.  He cannot pay the deductible on his fixed income.   Suggested he call insurance about deductible and payment plan options.   He is open to going back on warfarin and having INR checks

## 2023-09-23 NOTE — Telephone Encounter (Signed)
 Spoke with patient. Aware that MD(s) have OK'ed change back to warfarin. Routed to pharmacy team

## 2023-09-23 NOTE — Telephone Encounter (Signed)
 Pt c/o medication issue:  1. Name of Medication:  Eliquis    2. How are you currently taking this medication (dosage and times per day)?   3. Are you having a reaction (difficulty breathing--STAT)?   4. What is your medication issue?   Patient states Eliquis  is $47/bottle which is too expensive. He would like to know if he can switch back to Coumadin . Please advise.

## 2023-09-23 NOTE — Telephone Encounter (Signed)
 Cameron Grates, MD  You; Cv Div Pharmd; Cliffton Dama, Corinda A, RNJust now (4:46 PM)   OK to go back to warfarin but he will need close follow up.

## 2023-09-23 NOTE — Telephone Encounter (Signed)
 Thukkani, Arun K, MD  You; Cv Div Pharmd; Eilleen Grates, MD; MacMillan, Corinda A, RNJust now (3:32 PM)   This will be ultimately up to Dr. Lavonne Prairie and I do not see an issue with this.  He would need to be referred to Coumadin  clinic.

## 2023-09-24 ENCOUNTER — Other Ambulatory Visit: Payer: Self-pay

## 2023-09-24 MED ORDER — WARFARIN SODIUM 3 MG PO TABS: 3.0000 mg | ORAL_TABLET | Freq: Every day | ORAL | 1 refills | Status: DC

## 2023-09-24 NOTE — Telephone Encounter (Signed)
I spoke to patient who will resume Coumadin 3 mg Daily, except 1.5 mg on Monday, Wednesday and Friday.  He scheduled INR appt 2/21.

## 2023-10-04 ENCOUNTER — Ambulatory Visit: Payer: Medicare Other | Attending: Cardiology

## 2023-10-04 DIAGNOSIS — Z7901 Long term (current) use of anticoagulants: Secondary | ICD-10-CM | POA: Diagnosis not present

## 2023-10-04 LAB — POCT INR: INR: 2.3 (ref 2.0–3.0)

## 2023-10-04 NOTE — Patient Instructions (Signed)
Continue taking warfarin 1 tablet daily except 1/2 tablet on Mondays, Wednesdays and Fridays. Repeat INR in 2 weeks. Anticoagulation Clinic [phone]

## 2023-10-18 ENCOUNTER — Ambulatory Visit: Payer: Medicare Other

## 2023-10-24 ENCOUNTER — Ambulatory Visit: Attending: Cardiology

## 2023-10-24 DIAGNOSIS — Z7901 Long term (current) use of anticoagulants: Secondary | ICD-10-CM | POA: Diagnosis not present

## 2023-10-24 LAB — POCT INR: INR: 1.7 — AB (ref 2.0–3.0)

## 2023-10-24 NOTE — Patient Instructions (Signed)
 Description   Take 1.5 tablets today and then continue taking warfarin 1 tablet daily except 1/2 tablet on Mondays, Wednesdays and Fridays. Repeat INR in 2 weeks. Anticoagulation Clinic [phone]

## 2023-10-29 ENCOUNTER — Other Ambulatory Visit: Payer: Self-pay | Admitting: Cardiology

## 2023-10-30 ENCOUNTER — Other Ambulatory Visit (HOSPITAL_COMMUNITY): Payer: Medicare Other

## 2023-10-30 ENCOUNTER — Ambulatory Visit: Payer: Medicare Other

## 2023-11-03 NOTE — Progress Notes (Unsigned)
 [facility]   MULTIDISCIPLINARY HEART VALVE CLINIC                                     Cardiology Office Note:    Date:  11/05/2023   ID:  CAUY MELODY, DOB [birth year], MRN [id number]  PCP:  Ardith Dark, MD  [facility] Cardiologist:  Rollene Rotunda, MD  [facility] Structural heart: Orbie Pyo, MD [facility] Electrophysiologist:  None   Referring MD: Ardith Dark, MD   1 year s/p mTEER  History of Present Illness:    Cameron Patton is a 78 y.o. male with a hx of  permanent AF on Eliquis, current ETOH abuse, frequent PVCs, and severe MR s/p mTEER (10/25/22) who presents to clinic for follow up.   S/p mTEER on 10/25/23 reducing baseline 4+ mitral regurgitation to 1+ with placement of 3 Pascal ACE devices, positioned A3/P3, A2/P2, and lateral A2/P2. Final mean transmitral gradient 3 mmHg. POD1 echocardiogram showed normal LVEF at 55% with mildly elevated PASP at 44.8mmHg, with mild to moderate MR post implant and moderate TR with atrial shunting with two residual ASDs noted. He was started on low dose Lasix given moderate TR however he deferred this at discharge due to diarrhea.   Today the patient presents to clinic for follow up. Went to [facility] twice last week and did exercise with no issues. No CP or SOB. No LE edema, orthopnea or PND. No dizziness or syncope. No blood in stool or urine. No palpitations. Going to pub 3-4 nights a week. He usually drinks 2 beers and 2 shots. Reports significant issues with drinking and would like to stop. If he goes too long without drinking he starts to tremor. Has had DUIs in past and been in detox. Open to the idea of inpatient treatment.     Past Medical History:  Diagnosis Date   Allergic rhinitis    Atrial fibrillation (HCC)    Depression    Dysrhythmia    History of ETOH abuse    Hx of degenerative disc disease    S/P mitral valve clip implantation 10/25/2022   3x PASCAL ACE implantation with Dr. Lynnette Caffey and  Dr. Excell Seltzer     Current Medications: Current Meds  Medication Sig   B Complex-C (B-COMPLEX WITH VITAMIN C) tablet Take 1 tablet by mouth daily.   digoxin (LANOXIN) 0.125 MG tablet Take 1 tablet (0.125 mg total) by mouth daily.   Doxylamine Succinate, Sleep, (SLEEP AID PO) Take 1-2 tablets by mouth at bedtime as needed (sleep).   warfarin (COUMADIN) 3 MG tablet Take 1 tablet (3 mg total) by mouth daily.   [DISCONTINUED] amoxicillin (AMOXIL) 500 MG capsule Take 4 capsules (2,000 mg total) by mouth as directed. Take 4 tablets 1 hour prior to dental work, including cleanings.      ROS:   Please see the history of present illness.    All other systems reviewed and are negative.  EKGs       Risk Assessment/Calculations:            Physical Exam:    VS:  BP 124/72   Pulse 66   Ht 5\' 9"  (1.753 m)   Wt 148 lb (67.1 kg)   SpO2 99%   BMI 21.86 kg/m     Wt Readings from Last 3 Encounters:  11/04/23 148 lb (67.1 kg)  11/30/22 164 lb 3.2 oz (74.5 kg)  11/02/22 160 lb 3.2 oz (72.7 kg)     GEN: Well nourished, well developed in no acute distress NECK: No JVD CARDIAC: irreg irreg, 3/6 holosystolic murmur heard best at the apex. No rubs, gallops RESPIRATORY:  Clear to auscultation without rales, wheezing or rhonchi  ABDOMEN: Soft, non-tender, non-distended EXTREMITIES:  No edema; No deformity.    ASSESSMENT:    1. S/P mitral valve clip implantation   2. Longstanding persistent atrial fibrillation (HCC)   3. Alcohol abuse   4. Heart failure with preserved ejection fraction, unspecified HF chronicity (HCC)   5. ASD (atrial septal defect)     PLAN:    In order of problems listed above:  Severe MR s/p mTEER:  -- Echo today shows EF 55%, mod RVE, severe pulm HTN (RSVP 61.2 mm hg), s/p Pascal x3 with mild-mod residual MR and mild MS (mean gradient 7 mm hg), moderate TR and moderate sized ASD with predominantly L--> R flow.  -- NYHA class I symptoms.  -- Continue Coumadin.   -- SBE prophylaxis discussed; I have RX'd amoxicillin.  -- Continue regular follow up with Dr. Antoine Poche.  Permanent atrial fibrillation: -- Rate well controlled on digoxin 0.18mcg daily.  -- On Coumadin now. Followed by our clinic. -- Will get a digoxin level.   Alcohol abuse: -- Open about alcohol consumption and desire to quit. -- I think he will require inpatient detox and rehabilitation.  -- Will check CMET and CBC.  -- Will reach out to a colleague to see if we can get him resources for inpatient treatment options.   Chronic HFpEF: -- Appears euvolemic off diuretics.   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 -- Will discuss with Dr. Lynnette Caffey is further testing is indicated.     Medication Adjustments/Labs and Tests Ordered: Current medicines are reviewed at length with the patient today.  Concerns regarding medicines are outlined above.  Orders Placed This Encounter  Procedures   Digoxin level   Comp Met (CMET)   CBC   Meds ordered this encounter  Medications   amoxicillin (AMOXIL) 500 MG capsule    Sig: Take 4 capsules (2,000 mg total) by mouth as directed. Take 4 tablets 1 hour prior to dental work, including cleanings.    Dispense:  12 capsule    Refill:  6    Patient Instructions  Medication Instructions:  Your physician recommends that you continue on your current medications as directed. Please refer to the Current Medication list given to you today.  *If you need a refill on your cardiac medications before your next appointment, please call your pharmacy*   Lab Work: TODAY:  DIGOXIN LEVEL, CMET, CBC  If you have labs (blood work) drawn today and your tests are completely normal, you will receive your results only by: MyChart Message (if you have MyChart) OR A paper copy in the mail If you have any lab test that is abnormal or we need to change your treatment,  we will call you to review the results.   Testing/Procedures: NONE   Follow-Up: At [facility], you and your health needs are our priority.  As part of our continuing mission to provide you with exceptional heart care, we have created designated Provider Care Teams.  These Care Teams include your primary Cardiologist (physician) and Advanced Practice Providers (APPs -  Physician Assistants  and Nurse Practitioners) who all work together to provide you with the care you need, when you need it.  We recommend signing up for the patient portal called "MyChart".  Sign up information is provided on this After Visit Summary.  MyChart is used to connect with patients for Virtual Visits (Telemedicine).  Patients are able to view lab/test results, encounter notes, upcoming appointments, etc.  Non-urgent messages can be sent to your provider as well.   To learn more about what you can do with MyChart, go to [email or web address].    Your next appointment:   SOMEONE FROM THE STRUCTURAL HEART TEAM WITH CONTACT YOU TO SCHEDULE AN APPOINTMENT         Signed, Cline Crock, PA-C  11/05/2023 8:41 AM    [facility]

## 2023-11-04 ENCOUNTER — Ambulatory Visit: Payer: Medicare Other | Attending: Internal Medicine

## 2023-11-04 ENCOUNTER — Ambulatory Visit: Payer: Medicare Other | Admitting: Physician Assistant

## 2023-11-04 VITALS — BP 124/72 | HR 66 | Ht 69.0 in | Wt 148.0 lb

## 2023-11-04 DIAGNOSIS — Q211 Atrial septal defect, unspecified: Secondary | ICD-10-CM | POA: Insufficient documentation

## 2023-11-04 DIAGNOSIS — Z95818 Presence of other cardiac implants and grafts: Secondary | ICD-10-CM | POA: Insufficient documentation

## 2023-11-04 DIAGNOSIS — I4811 Longstanding persistent atrial fibrillation: Secondary | ICD-10-CM

## 2023-11-04 DIAGNOSIS — Z9889 Other specified postprocedural states: Secondary | ICD-10-CM

## 2023-11-04 DIAGNOSIS — Z952 Presence of prosthetic heart valve: Secondary | ICD-10-CM | POA: Diagnosis not present

## 2023-11-04 DIAGNOSIS — I493 Ventricular premature depolarization: Secondary | ICD-10-CM

## 2023-11-04 DIAGNOSIS — F1010 Alcohol abuse, uncomplicated: Secondary | ICD-10-CM | POA: Insufficient documentation

## 2023-11-04 DIAGNOSIS — I5030 Unspecified diastolic (congestive) heart failure: Secondary | ICD-10-CM | POA: Insufficient documentation

## 2023-11-04 LAB — ECHOCARDIOGRAM COMPLETE
AR max vel: 2.06 cm2
AV Area VTI: 1.83 cm2
AV Area mean vel: 1.92 cm2
AV Mean grad: 5.5 mmHg
AV Peak grad: 9.9 mmHg
Ao pk vel: 1.58 m/s
Area-P 1/2: 1.74 cm2
MV M vel: 5.73 m/s
MV Peak grad: 131.2 mmHg
MV VTI: 0.99 cm2
S' Lateral: 3 cm

## 2023-11-04 MED ORDER — AMOXICILLIN 500 MG PO CAPS: 2000.0000 mg | ORAL_CAPSULE | ORAL | 6 refills | Status: DC

## 2023-11-04 NOTE — Patient Instructions (Signed)
 Medication Instructions:  Your physician recommends that you continue on your current medications as directed. Please refer to the Current Medication list given to you today.  *If you need a refill on your cardiac medications before your next appointment, please call your pharmacy*   Lab Work: TODAY:  DIGOXIN LEVEL, CMET, CBC  If you have labs (blood work) drawn today and your tests are completely normal, you will receive your results only by: MyChart Message (if you have MyChart) OR A paper copy in the mail If you have any lab test that is abnormal or we need to change your treatment, we will call you to review the results.   Testing/Procedures: NONE   Follow-Up: At [facility], you and your health needs are our priority.  As part of our continuing mission to provide you with exceptional heart care, we have created designated Provider Care Teams.  These Care Teams include your primary Cardiologist (physician) and Advanced Practice Providers (APPs -  Physician Assistants and Nurse Practitioners) who all work together to provide you with the care you need, when you need it.  We recommend signing up for the patient portal called "MyChart".  Sign up information is provided on this After Visit Summary.  MyChart is used to connect with patients for Virtual Visits (Telemedicine).  Patients are able to view lab/test results, encounter notes, upcoming appointments, etc.  Non-urgent messages can be sent to your provider as well.   To learn more about what you can do with MyChart, go to [email or web address].    Your next appointment:   SOMEONE FROM THE STRUCTURAL HEART TEAM WITH CONTACT YOU TO SCHEDULE AN APPOINTMENT

## 2023-11-05 LAB — COMPREHENSIVE METABOLIC PANEL
ALT: 21 IU/L (ref 0–44)
AST: 35 IU/L (ref 0–40)
Albumin: 4.2 g/dL (ref 3.8–4.8)
Alkaline Phosphatase: 122 IU/L — ABNORMAL HIGH (ref 44–121)
BUN/Creatinine Ratio: 24 (ref 10–24)
BUN: 19 mg/dL (ref 8–27)
Bilirubin Total: 1.2 mg/dL (ref 0.0–1.2)
CO2: 25 mmol/L (ref 20–29)
Calcium: 9.4 mg/dL (ref 8.6–10.2)
Chloride: 97 mmol/L (ref 96–106)
Creatinine, Ser: 0.78 mg/dL (ref 0.76–1.27)
Globulin, Total: 2.7 g/dL (ref 1.5–4.5)
Glucose: 77 mg/dL (ref 70–99)
Potassium: 4.8 mmol/L (ref 3.5–5.2)
Sodium: 138 mmol/L (ref 134–144)
Total Protein: 6.9 g/dL (ref 6.0–8.5)
eGFR: 90 mL/min/{1.73_m2} (ref >59)

## 2023-11-05 LAB — CBC
Hematocrit: 37.2 % — ABNORMAL LOW (ref 37.5–51.0)
Hemoglobin: 12.3 g/dL — ABNORMAL LOW (ref 13.0–17.7)
MCH: 31.6 pg (ref 26.6–33.0)
MCHC: 33.1 g/dL (ref 31.5–35.7)
MCV: 96 fL (ref 79–97)
Platelets: 208 10*3/uL (ref 150–450)
RBC: 3.89 x10E6/uL — ABNORMAL LOW (ref 4.14–5.80)
RDW: 12.6 % (ref 11.6–15.4)
WBC: 6.8 10*3/uL (ref 3.4–10.8)

## 2023-11-05 LAB — DIGOXIN LEVEL: Digoxin, Serum: 1.2 ng/mL — ABNORMAL HIGH (ref 0.5–0.9)

## 2023-11-07 ENCOUNTER — Ambulatory Visit: Attending: Cardiology | Admitting: *Deleted

## 2023-11-07 DIAGNOSIS — Z5181 Encounter for therapeutic drug level monitoring: Secondary | ICD-10-CM | POA: Diagnosis not present

## 2023-11-07 DIAGNOSIS — Z7901 Long term (current) use of anticoagulants: Secondary | ICD-10-CM | POA: Diagnosis not present

## 2023-11-07 DIAGNOSIS — I4811 Longstanding persistent atrial fibrillation: Secondary | ICD-10-CM

## 2023-11-07 LAB — POCT INR: POC INR: 2

## 2023-11-07 NOTE — Patient Instructions (Addendum)
 Description   START taking warfarin 1 tablet daily except for 1/2 a tablet on Monday and Fridays. Recheck INR in 3 weeks.  Anticoagulation Clinic [phone]

## 2023-11-15 NOTE — H&P (View-Only) (Signed)
 Patient ID: Cameron Patton MRN: [id number] DOB/AGE: [birth year] 78 y.o.  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 male with the indicated medical history here for recommendations regarding his probably valvular disease.  The patient has been followed by Dr. Lavonne Prairie for several years.  A recent echocardiogram done last year demonstrated severe mitral regurgitation.  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  He also has mild to moderate tricuspid regurgitation.  Patient was seen by Dr. Honey Lusty who was considering a mitral and tricuspid valve repair as well as a left atrial appendage ligation procedure.  However he had concerns about the patient's recovery and doing well long-term after complex surgical repair given his very significant history of alcohol abuse.  This is a significant issue so much so that the patient has now started taking the bus to go to bars because he has had 2 or 3 DUIs.  He is also blacked out or passed out due to excessive alcohol intake at times.  Fortunately has not hit his head.  On review of his INR as he has been subtherapeutic on a number of occasions.   In terms of symptoms the patient tells me he is not really short of breath.  When pressed more he tells me when he goes on walks he is walking 2 dogs and he really starts and stops.  He says he can go up stairs without any issues.  He denies any paroxysmal nocturnal dyspnea, orthopnea, presyncope, syncope not related to alcohol use, or exertional chest pain.   He does see a  dentist on a regular basis and tells me that he needs some dental work done for an eroding tooth.  Plan:  ETT and check BNP.   January 2024: The patient returns for follow-up after his exercise treadmill test.  He was unable to perform to age-appropriate norms.  Additionally his BNP was elevated.  He tells me that he is relatively unchanged versus last time I saw him.  On his own account he does not do much in a day.  In fact he told me that he had a long night on Monday night and he was at the bar till 2 AM and he slept till about 3 PM the next day.  On further query he tells me that he was able to walk with his dog when he was visiting family in [place] but again he had to stop many times because the dog wanted to stop.  He denies any severe bleeding or bruising while on Eliquis .  He has had no issues with this medication aside from missing 1 dose few days ago.  He denies any paroxysmal nocturnal dyspnea or orthopnea.   He did see his dentist and does not need dental work done to extract an eroding tooth.  This is scheduled for February 1.  Plan: Referral for mitral transcatheter edge-to-edge repair.  April 2025:  Patient consents to use of AI scribe. The patient returns for follow-up.  He was seen recently in the structural heart disease clinic and was doing well.  He endorsed  NYHA class I symptoms.  An echocardiogram demonstrated moderate ASD with left-to-right flow.  His RV was found to be dilated.  He is referred for further recommendations.  On review of the patient's previous echocardiograms he does demonstrate RV dilatation.  In terms of physical activity, he engages in moderate exercise at the [facility], including ten minutes on a stationary bike and weightlifting with caution. He is mindful of his body's signals, having been a runner in the past, and adjusts his weightlifting routine accordingly. He also enjoys walking, even if at a slow pace, and considers it part of his routine to stay limber and  maintain muscle flexibility. No issues with breathing and feels fine overall.      Past Medical History:  Diagnosis Date   Allergic rhinitis    Atrial fibrillation (HCC)    Depression    Dysrhythmia    History of ETOH abuse    Hx of degenerative disc disease    S/P mitral valve clip implantation 10/25/2022   3x PASCAL ACE implantation with Dr. Lorie Rook and Dr. Arlester Ladd    Past Surgical History:  Procedure Laterality Date   ABDOMINAL AORTOGRAM N/A 07/18/2022   Procedure: ABDOMINAL AORTOGRAM;  Surgeon: Lucendia Rusk, MD;  Location[facility] INVASIVE CV LAB;  Service: Cardiovascular;  Laterality: N/A;   BUBBLE STUDY  05/30/2022   Procedure: BUBBLE STUDY;  Surgeon: Harrold Lincoln, MD;  Location: [facility] ENDOSCOPY;  Service: Cardiovascular;;   CATARACT EXTRACTION BILATERAL W/ ANTERIOR VITRECTOMY Bilateral 2015   INGUINAL HERNIA REPAIR Right 1982   RIGHT/LEFT HEART CATH AND CORONARY ANGIOGRAPHY N/A 07/18/2022   Procedure: RIGHT/LEFT HEART CATH AND CORONARY ANGIOGRAPHY;  Surgeon: Lucendia Rusk, MD;  Location: [facility] INVASIVE CV LAB;  Service: Cardiovascular;  Laterality: N/A;   TEE WITHOUT CARDIOVERSION N/A 05/30/2022   Procedure: TRANSESOPHAGEAL ECHOCARDIOGRAM (TEE);  Surgeon: Harrold Lincoln, MD;  Location: [facility] ENDOSCOPY;  Service: Cardiovascular;  Laterality: N/A;   TEE WITHOUT CARDIOVERSION N/A 10/25/2022   Procedure: TRANSESOPHAGEAL ECHOCARDIOGRAM;  Surgeon: Kyra Phy, MD;  Location: [facility] INVASIVE CV LAB;  Service: Cardiovascular;  Laterality: N/A;   TRANSCATHETER MITRAL EDGE TO EDGE REPAIR N/A 10/25/2022   Procedure: MITRAL VALVE REPAIR;  Surgeon: Kyra Phy, MD;  Location: [facility] INVASIVE CV LAB;  Service: Cardiovascular;  Laterality: N/A;    Family History  Problem Relation Age of Onset   Alcoholism Mother        Died age 67   Stroke Brother    Cancer Brother        Liver/lung    Social History   Socioeconomic History   Marital status: Single    Spouse name:  Not on file   Number of children: 2   Years of education: Not on file   Highest education level: Not on file  Occupational History   Occupation: Retired   Tobacco Use   Smoking status: Never   Smokeless tobacco: Never  Vaping Use   Vaping status: Never Used  Substance and Sexual Activity   Alcohol use: Yes    Alcohol/week: 13.0 standard drinks of alcohol    Types: 8 Cans of beer, 5 Shots of liquor per week    Comment: Daily (4-5 beers, 1-2 shots - 4-5 days a week)   Drug use: Never   Sexual activity: Yes    Partners: Female  Other Topics Concern   Not on file  Social History Narrative   ** Merged History Encounter **       Relocated  to [facility] from [place] 08/2015 months ago after significant hospital stay with UTI leading to sepsis. Went to rehab for a few months afterwards. Sister in law only family in [place]  Son lives in [place] Daughter lives in [place]     (addiction counselor)    Social Drivers of Health   Financial Resource Strain: Low Risk  (11/03/2020)   Overall Financial Resource Strain (CARDIA)    Difficulty of Paying Living Expenses: Not hard at all  Food Insecurity: No Food Insecurity (10/29/2022)   Hunger Vital Sign    Worried About Running Out of Food in the Last Year: Never true    Ran Out of Food in the Last Year: Never true  Transportation Needs: No Transportation Needs (10/29/2022)   PRAPARE - Administrator, Civil Service (Medical): No    Lack of Transportation (Non-Medical): No  Physical Activity: Insufficiently Active (11/03/2020)   Exercise Vital Sign    Days of Exercise per Week: 4 days    Minutes of Exercise per Session: 30 min  Stress: No Stress Concern Present (11/03/2020)   [employer] of Occupational Health - Occupational Stress Questionnaire    Feeling of Stress : Only a little  Social Connections: Socially Isolated (11/03/2020)   Social Connection and Isolation Panel [NHANES]    Frequency of Communication with  Friends and Family: Twice a week    Frequency of Social Gatherings with Friends and Family: Once a week    Attends Religious Services: Never    Database administrator or Organizations: No    Attends Banker Meetings: Never    Marital Status: Never married  Intimate Partner Violence: Not At Risk (11/03/2020)   Humiliation, Afraid, Rape, and Kick questionnaire    Fear of Current or Ex-Partner: No    Emotionally Abused: No    Physically Abused: No    Sexually Abused: No     Prior to Admission medications   Medication Sig Start Date End Date Taking? Authorizing Provider  Acetaminophen -DM (COLD & COUGH DAYTIME PO) Take 1 tablet by mouth daily. Patient not taking: Reported on 11/04/2023    [provider]  amoxicillin  (AMOXIL ) 500 MG capsule Take 4 capsules (2,000 mg total) by mouth as directed. Take 4 tablets 1 hour prior to dental work, including cleanings. 11/04/23   Ardia Kraft, PA-C  B Complex-C (B-COMPLEX WITH VITAMIN C) tablet Take 1 tablet by mouth daily.    [provider]  digoxin  (LANOXIN ) 0.125 MG tablet Take 1 tablet (0.125 mg total) by mouth daily. 10/31/23   Eilleen Grates, MD  Doxylamine Succinate, Sleep, (SLEEP AID PO) Take 1-2 tablets by mouth at bedtime as needed (sleep).    [provider]  warfarin (COUMADIN ) 3 MG tablet Take 1 tablet (3 mg total) by mouth daily. 09/24/23   Eilleen Grates, MD    No Known Allergies  REVIEW OF SYSTEMS:  General: no fevers/chills/night sweats Eyes: no blurry vision, diplopia, or amaurosis ENT: no sore throat or hearing loss Resp: no cough, wheezing, or hemoptysis CV: no edema or palpitations GI: no abdominal pain, nausea, vomiting, diarrhea, or constipation GU: no dysuria, frequency, or hematuria Skin: no rash Neuro: no headache, numbness, tingling, or weakness of extremities Musculoskeletal: no joint pain or swelling Heme: no bleeding, DVT, or easy bruising Endo: no polydipsia or  polyuria  BP 116/62   Pulse 72   Ht 5\' 9"  (1.753 m)   Wt 142 lb (64.4 kg)   SpO2 96%  BMI 20.97 kg/m   PHYSICAL EXAM: GEN:  AO x 3 in no acute distress HEENT: normal Dentition: Normal Neck: JVP normal. +2 carotid upstrokes without bruits. No thyromegaly. Lungs: equal expansion, clear bilaterally CV: Apex is discrete and nondisplaced, RRR with 2 out of 6 holosystolic murmur axilla Abd: soft, non-tender, non-distended; no bruit; positive bowel sounds Ext: no edema, ecchymoses, or cyanosis Vascular: 2+ femoral pulses, 2+ radial pulses       Skin: warm and dry without rash Neuro: CN II-XII grossly intact; motor and sensory grossly intact    DATA AND STUDIES:  EKG: EKG from March 2024 demonstrates rate controlled atrial fibrillation with occasional PVCs  EKG Interpretation Date/Time:    Ventricular Rate:    PR Interval:    QRS Duration:    QT Interval:    QTC Calculation:   R Axis:      Text Interpretation:          Cardiac Studies & Procedures   ______________________________________________________________________________________________ CARDIAC CATHETERIZATION  CARDIAC CATHETERIZATION 10/25/2022  Narrative Successful mitral transcatheter edge-to-edge repair with 3 PASCAL ACE devices reducing severe to trace MR with residual mean gradient of (at HR of 50bpm).   CARDIAC CATHETERIZATION  CARDIAC CATHETERIZATION 07/18/2022  Narrative   Mid LAD lesion is 40% stenosed.   LV end diastolic pressure is mildly elevated.   Hemodynamic findings consistent with mild pulmonary hypertension.   There is no aortic valve stenosis.   Aortic saturation 97%, PA saturation 65%, mean RA pressure 11 mmHg, PA pressure 46/20, mean PA pressure 34 mmHg, pulmonary capillary wedge pressure 23/31, mean pulmonary capillary wedge pressure 21 mmHg, significant V waves noted, cardiac output 4.68 L/min, cardiac index 2.44.   Heavily calcified right radial artery with difficulty removing  as noted in the procedural narrative.  Although the catheterization could be completed from the right radial approach, would rec and a different Roche if future Authorization was needed.   No abdominal aortic aneurysm.  Very tortuous right common iliac artery.  Mild, nonobstructive coronary artery disease.  No abdominal aortic aneurysm.  Tortuous right common iliac.  Findings Coronary Findings Diagnostic  Dominance: Right  Left Anterior Descending The vessel exhibits minimal luminal irregularities. Mid LAD lesion is 40% stenosed.  Left Circumflex The vessel exhibits minimal luminal irregularities.  Right Coronary Artery Vessel is large. The vessel exhibits minimal luminal irregularities.  Intervention  No interventions have been documented.   STRESS TESTS  EXERCISE TOLERANCE TEST (ETT) 08/28/2022  Narrative   No ST deviation was noted.   Decreased exercise effort, 4 minutes and 31 seconds on treadmill.  Achieved 6 oint 4 METS.  No angina.   Atrial fibrillation was noted at baseline.  Atrial fibrillation with rapid ventricular response up to 171 bpm was noted during stress.   Occasional PVCs noted, rare couplet.   Overall mild to moderate risk exercise treadmill test mostly secondary to decreased exercise effort.  There was no obvious ST segment depression during exercise.  Atrial fibrillation noted throughout testing.   ECHOCARDIOGRAM  ECHOCARDIOGRAM COMPLETE 11/04/2023  Narrative ECHOCARDIOGRAM REPORT    Patient Name:   Cameron Patton Date of Exam: 11/04/2023 Medical Rec #:  [id number]     Height:       69.0 in Accession #:    [id number]    Weight:       164.2 lb Date of Birth:  1945-11-21     BSA:          1.900 m Patient Age:  78 years      BP:           124/72 mmHg Patient Gender: M             HR:           85 bpm. Exam Location:  [address]  Procedure: 2D Echo, 3D Echo, Cardiac Doppler and Color Doppler (Both Spectral and Color Flow Doppler were utilized  during procedure).  Indications:    Z95.2 Status Post MitraClip  History:        Patient has prior history of Echocardiogram examinations, most recent 10/25/2022. Mitral Valve Disease, Arrythmias:Atrial Fibrillation; Risk Factors:Hypertension. ETOH Abuse, Status Post MitraClip (10-25-22, x3 Pascal ACE Implantation).  Mitral Valve: Mitra-Clip valve is present in the mitral position. Procedure Date: 10/25/2022.  Sonographer:    Ewing Holiday RDCS Referring Phys: JILL D MCDANIEL  IMPRESSIONS   1. Left ventricular ejection fraction, by estimation, is 55 to 60%. The left ventricle has normal function. The left ventricle has no regional wall motion abnormalities. Left ventricular diastolic function could not be evaluated. 2. Right ventricular systolic function is low normal. The right ventricular size is moderately enlarged. There is severely elevated pulmonary artery systolic pressure. The estimated right ventricular systolic pressure is 61.2 mmHg. 3. Left atrial size was severely dilated. 4. Right atrial size was severely dilated. 5. The mitral valve has been repaired/replaced. Moderate mitral valve regurgitation. Mild mitral stenosis. The mean mitral valve gradient is 7.0 mmHg. There is a Mitra-Clip present in the mitral position. Procedure Date: 10/25/2022. 6. Tricuspid valve regurgitation is moderate. 7. The aortic valve is tricuspid. There is mild calcification of the aortic valve. Aortic valve regurgitation is not visualized. Aortic valve sclerosis is present, with no evidence of aortic valve stenosis. 8. The inferior vena cava is dilated in size with <50% respiratory variability, suggesting right atrial pressure of 15 mmHg. 9. Evidence of atrial level shunting detected by color flow Doppler. There is a moderately sized atrial septal defect with predominantly left to right shunting across the atrial septum.  Comparison(s): Prior images unable to be directly viewed, comparison made by  report only. LVEF slightly improved, RV has enlarged compared to prior study.  FINDINGS Left Ventricle: Left ventricular ejection fraction, by estimation, is 55 to 60%. The left ventricle has normal function. The left ventricle has no regional wall motion abnormalities. 3D ejection fraction reviewed and evaluated as part of the interpretation. Alternate measurement of EF is felt to be most reflective of LV function. The left ventricular internal cavity size was normal in size. There is no left ventricular hypertrophy. Left ventricular diastolic function could not be evaluated due to atrial fibrillation. Left ventricular diastolic function could not be evaluated.  Right Ventricle: The right ventricular size is moderately enlarged. Right vetricular wall thickness was not well visualized. Right ventricular systolic function is low normal. There is severely elevated pulmonary artery systolic pressure. The tricuspid regurgitant velocity is 3.40 m/s, and with an assumed right atrial pressure of 15 mmHg, the estimated right ventricular systolic pressure is 61.2 mmHg.  Left Atrium: Left atrial size was severely dilated.  Right Atrium: Right atrial size was severely dilated.  Pericardium: There is no evidence of pericardial effusion.  Mitral Valve: MVA 1.77 cm2 by DT. The mitral valve has been repaired/replaced. Moderate mitral valve regurgitation. There is a Mitra-Clip present in the mitral position. Procedure Date: 10/25/2022. Mild mitral valve stenosis. MV peak gradient, 20.1 mmHg. The mean mitral valve gradient is 7.0 mmHg.  Tricuspid Valve: The tricuspid valve is normal in structure. Tricuspid valve regurgitation is moderate . No evidence of tricuspid stenosis.  Aortic Valve: The aortic valve is tricuspid. There is mild calcification of the aortic valve. Aortic valve regurgitation is not visualized. Aortic valve sclerosis is present, with no evidence of aortic valve stenosis. Aortic valve mean  gradient measures 5.5 mmHg. Aortic valve peak gradient measures 9.9 mmHg. Aortic valve area, by VTI measures 1.83 cm.  Pulmonic Valve: The pulmonic valve was grossly normal. Pulmonic valve regurgitation is mild. No evidence of pulmonic stenosis.  Aorta: The aortic root, ascending aorta, aortic arch and descending aorta are all structurally normal, with no evidence of dilitation or obstruction.  Venous: The inferior vena cava is dilated in size with less than 50% respiratory variability, suggesting right atrial pressure of 15 mmHg.  IAS/Shunts: Evidence of atrial level shunting detected by color flow Doppler. There is a moderately sized atrial septal defect with predominantly left to right shunting across the atrial septum.  Additional Comments: 3D was performed not requiring image post processing on an independent workstation and was normal.   LEFT VENTRICLE PLAX 2D LVIDd:         4.90 cm   Diastology LVIDs:         3.00 cm   LV e' medial:    5.82 cm/s LV PW:         1.00 cm   LV E/e' medial:  39.7 LV IVS:        0.80 cm   LV e' lateral:   10.30 cm/s LVOT diam:     2.40 cm   LV E/e' lateral: 22.4 LV SV:         55 LV SV Index:   29 LVOT Area:     4.52 cm  3D Volume EF: 3D EF:        74 % LV EDV:       168 ml LV ESV:       43 ml LV SV:        124 ml  RIGHT VENTRICLE RV Basal diam:  4.60 cm RV Mid diam:    3.30 cm RV S prime:     14.00 cm/s TAPSE (M-mode): 1.4 cm RVSP:           49.2 mmHg  LEFT ATRIUM              Index         RIGHT ATRIUM           Index LA diam:        6.70 cm  3.53 cm/m    RA Pressure: 3.00 mmHg LA Vol (A2C):   302.0 ml 158.96 ml/m  RA Area:     50.30 cm LA Vol (A4C):   217.0 ml 114.22 ml/m  RA Volume:   239.00 ml 125.80 ml/m LA Biplane Vol: 264.0 ml 138.96 ml/m AORTIC VALVE AV Area (Vmax):    2.06 cm AV Area (Vmean):   1.92 cm AV Area (VTI):     1.83 cm AV Vmax:           157.50 cm/s AV Vmean:          106.000 cm/s AV VTI:             0.298 m AV Peak Grad:      9.9 mmHg AV Mean Grad:      5.5 mmHg LVOT Vmax:         71.60  cm/s LVOT Vmean:        45.050 cm/s LVOT VTI:          0.120 m LVOT/AV VTI ratio: 0.41  AORTA Ao Root diam: 3.00 cm Ao Asc diam:  3.60 cm  MITRAL VALVE                TRICUSPID VALVE MV Area (PHT): cm          TR Peak grad:   46.2 mmHg MV Area VTI:   0.99 cm     TR Vmax:        340.00 cm/s MV Peak grad:  20.1 mmHg    Estimated RAP:  3.00 mmHg MV Mean grad:  7.0 mmHg     RVSP:           49.2 mmHg MV Vmax:       2.24 m/s MV Vmean:      119.0 cm/s   SHUNTS MV Decel Time: 437 msec     Systemic VTI:  0.12 m MR Peak grad: 131.2 mmHg    Systemic Diam: 2.40 cm MR Mean grad: 85.3 mmHg MR Vmax:      572.67 cm/s MR Vmean:     434.3 cm/s MV E velocity: 231.00 cm/s  Sheryle Donning MD Electronically signed by Sheryle Donning MD Signature Date/Time: 11/04/2023/5:24:53 PM    Final   TEE  ECHO TEE 10/25/2022  Narrative TRANSESOPHOGEAL ECHO REPORT    Patient Name:   Cameron Patton Date of Exam: 10/25/2022 Medical Rec #:  [id number]     Height:       69.0 in Accession #:    [id number]    Weight:       162.0 lb Date of Birth:  [birth year]     BSA:          1.889 m Patient Age:    76 years      BP:           162/86 mmHg Patient Gender: M             HR:           90 bpm. Exam Location:  Inpatient  Procedure: Transesophageal Echo, 3D Echo, Color Doppler and Cardiac Doppler  Indications:     Mitral Regurgitation i34.0  History:         Patient has prior history of Echocardiogram examinations, most recent 05/30/2022. Arrythmias:Atrial Fibrillation; Risk Factors:ETOH.  Sonographer:     Sherline Distel Senior RDCS Referring Phys:  [id number] Kyra Phy Diagnosing Phys: Gloriann Larger MD   Sonographer Comments: Mitral TEER with 3 Pascal clips   PROCEDURE: After discussion of the risks and benefits of a TEE, an informed consent was obtained from the patient. The transesophogeal probe  was passed without difficulty through the esophogus of the patient. Sedation performed by different physician. The patient was monitored while under deep sedation. The patient developed no complications during the procedure.  IMPRESSIONS   1. Prior to procedure there was severe mitral valve regurgitation. There was rare systolic flow reversal of the right sided pulmonary veins. 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 There wer multiple jets with the largest being medial ~ A3/P3. Left ventricular ejection fraction, by estimation, is 60 to 65%. The left ventricle has normal function. Mean gradient of 1 mm Hg with normal 3D MVA. 2. A transeptal puncture was performed by with an overly posterior approach. A more mid-mid approach was performed and but prior to guide  moving to the left atrium clot was seen. System was removed and the mid-mid approach was performed. 3. A Pascal ACE was placed in the medial A3/P3. This lateralized the rest of the regurgitation. Mean gradient of 1 mm Hg. 4. A second Pascal ACE awas placed A2/P2 next to the original device. Mild to modreate mitral regurgitation, MVA 6 cm2 with mean gradient of 80m. 5. A third Pascal ACE was placed lateral to the prior two. Trace MR. Mean gradient of 3.5 mm Hg. 6. No change in pericardial effusion. 7. Right ventricular systolic function is normal. The right ventricular size is moderately enlarged. 8. Left atrial size was severely dilated. No left atrial/left atrial appendage thrombus was detected. 9. Right atrial size was severely dilated. 10. Tricuspid valve regurgitation is mild to moderate. 11. The aortic valve is tricuspid. Aortic valve regurgitation is not visualized. No aortic stenosis is present. 12. There is Moderate (Grade III) plaque involving the descending aorta. 13. Evidence of atrial level shunting detected by color flow Doppler.  Conclusion(s)/Recommendation(s): Successful placement of 3 Pascal  devices.  FINDINGS Left Ventricle: Left ventricular ejection fraction, by estimation, is 60 to 65%. The left ventricle has normal function. The left ventricular internal cavity size was normal in size.  Right Ventricle: The right ventricular size is moderately enlarged. Right vetricular wall thickness was not well visualized. Right ventricular systolic function is normal.  Left Atrium: Left atrial size was severely dilated. No left atrial/left atrial appendage thrombus was detected.  Right Atrium: Right atrial size was severely dilated.  Pericardium: Trivial pericardial effusion is present. The pericardial effusion is surrounding the apex.  Mitral Valve: The mitral valve has been repaired/replaced. Trivial mitral valve regurgitation. MV peak gradient, 4.4 mmHg. The mean mitral valve gradient is 1.3 mmHg.  Tricuspid Valve: The tricuspid valve is normal in structure. Tricuspid valve regurgitation is mild to moderate. No evidence of tricuspid stenosis.  Aortic Valve: The aortic valve is tricuspid. Aortic valve regurgitation is not visualized. No aortic stenosis is present.  Pulmonic Valve: The pulmonic valve was grossly normal. Pulmonic valve regurgitation is trivial.  Aorta: The aortic root and ascending aorta are structurally normal, with no evidence of dilitation. There is moderate (Grade III) plaque involving the descending aorta.  IAS/Shunts: Evidence of atrial level shunting detected by color flow Doppler.  Additional Comments: Spectral Doppler performed.  AORTIC VALVE LVOT Vmax:   56.50 cm/s LVOT Vmean:  36.800 cm/s LVOT VTI:    0.116 m  MITRAL VALVE MV Peak grad: 4.4 mmHg        SHUNTS MV Mean grad: 1.3 mmHg        Systemic VTI: 0.12 m MV Vmax:      1.05 m/s MV Vmean:     50.6 cm/s MR Peak grad:    135.0 mmHg MR Mean grad:    85.0 mmHg MR Vmax:         581.00 cm/s MR Vmean:        430.0 cm/s MR PISA:         2.65 cm MR PISA Eff ROA: 18 mm MR PISA Radius:  0.65  cm  Gloriann Larger MD Electronically signed by Gloriann Larger MD Signature Date/Time: 10/25/2022/1:14:24 PM    Final        ______________________________________________________________________________________________      11/04/2023: ALT 21; BUN 19; Creatinine, Ser 0.78; Hemoglobin 12.3; Platelets 208; Potassium 4.8; Sodium 138    NHYA CLASS: 1     ASSESSMENT AND PLAN:   1.  ASD (atrial septal defect)   2. Right ventricular dilation   3. S/P mitral valve clip implantation   4. Nonrheumatic tricuspid valve regurgitation   5. Longstanding persistent atrial fibrillation (HCC)   6. Secondary hypercoagulable state (HCC)   7. Hyperlipidemia LDL goal <70   8. Aortic atherosclerosis (HCC)     ASD: The patient's iatrogenic ASD shows left-to-right flow.  I reviewed the patient's echocardiogram prior to mitral transcatheter edge-to-edge repair and I reviewed his most recent echocardiogram.  He demonstrates RV dilatation on both echocardiograms.  My sense is that his RV and RA are enlarged from longstanding atrial fibrillation.  I spoke with the patient about right heart catheterization to measure Qp/Qs.  I will refer him for this procedure.  Because he does not have anyone to drive him I will not administer sedation during the procedure and only give the patient local anesthesia. Right ventricular dilatation: Looks relatively the same on my review of serial echocardiograms.  Will refer for right heart catheterization to evaluate Qp/Qs. 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 TR: Mild to moderate; continue medical therapy. Persistent atrial fibrillation: Continue warfarin 3 mg: Followed by INR clinic. Secondary hypercoagulable state: Continue Hyperlipidemia: Per primary cardiologist. Aortic atherosclerosis: Per primary cardiologist:  I have personally reviewed the patients imaging data as summarized above.  I have reviewed the  natural history of mitral regurgitation with the patient and family members who are present today. We have discussed the limitations of medical therapy and the poor prognosis associated with symptomatic mitral regurgitation. We have also reviewed potential treatment options, including palliative medical therapy, conventional mitral surgery, and transcatheter mitral edge-to-edge repair. We discussed treatment options in the context of this patient's specific comorbid medical conditions.   All of the patient's questions were answered today. Will make further recommendations based on the results of studies outlined above.   I spent 33 minutes reviewing all clinical data during and prior to this visit including all relevant imaging studies, laboratories, clinical information from other health systems and prior notes from both Cardiology and other specialties, interviewing the patient, conducting a complete physical examination, and coordinating care in order to formulate a comprehensive and personalized evaluation and treatment plan.   Tammy Ericsson K Talma Aguillard, MD  11/21/2023 6:40 AM    [facility] [address] [place], [place], [place]  [address] Phone: [phone]; Fax: [phone]

## 2023-11-15 NOTE — Progress Notes (Unsigned)
 Patient ID: Cameron Patton MRN: [id number] DOB/AGE: 1946/01/16 78 y.o.  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 male with the indicated medical history here for recommendations regarding his probably valvular disease.  The patient has been followed by Dr. Antoine Poche for several years.  A recent echocardiogram done last year demonstrated severe mitral regurgitation.  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  He also has mild to moderate tricuspid regurgitation.  Patient was seen by Dr. Leafy Ro who was considering a mitral and tricuspid valve repair as well as a left atrial appendage ligation procedure.  However he had concerns about the patient's recovery and doing well long-term after complex surgical repair given his very significant history of alcohol abuse.  This is a significant issue so much so that the patient has now started taking the bus to go to bars because he has had 2 or 3 DUIs.  He is also blacked out or passed out due to excessive alcohol intake at times.  Fortunately has not hit his head.  On review of his INR as he has been subtherapeutic on a number of occasions.   In terms of symptoms the patient tells me he is not really short of breath.  When pressed more he tells me when he goes on walks he is walking 2 dogs and he really starts and stops.  He says he can go up stairs without any issues.  He denies any paroxysmal nocturnal dyspnea, orthopnea, presyncope, syncope not related to alcohol use, or exertional chest pain.   He does see a  dentist on a regular basis and tells me that he needs some dental work done for an eroding tooth.  Plan:  ETT and check BNP.   January 2024: The patient returns for follow-up after his exercise treadmill test.  He was unable to perform to age-appropriate norms.  Additionally his BNP was elevated.  He tells me that he is relatively unchanged versus last time I saw him.  On his own account he does not do much in a day.  In fact he told me that he had a long night on Monday night and he was at the bar till 2 AM and he slept till about 3 PM the next day.  On further query he tells me that he was able to walk with his dog when he was visiting family in [place] but again he had to stop many times because the dog wanted to stop.  He denies any severe bleeding or bruising while on Eliquis.  He has had no issues with this medication aside from missing 1 dose few days ago.  He denies any paroxysmal nocturnal dyspnea or orthopnea.   He did see his dentist and does not need dental work done to extract an eroding tooth.  This is scheduled for February 1.  Plan: Referral for mitral transcatheter edge-to-edge repair.  April 2025:  Patient consents to use of AI scribe. The patient returns for follow-up.  He was seen recently in the structural heart disease clinic and was doing well.  He endorsed  NYHA class I symptoms.  An echocardiogram demonstrated moderate ASD with left-to-right flow.  His RV was found to be dilated.  He is referred for further recommendations.  On review of the patient's previous echocardiograms he does demonstrate RV dilatation.  In terms of physical activity, he engages in moderate exercise at the [facility], including ten minutes on a stationary bike and weightlifting with caution. He is mindful of his body's signals, having been a runner in the past, and adjusts his weightlifting routine accordingly. He also enjoys walking, even if at a slow pace, and considers it part of his routine to stay limber and  maintain muscle flexibility. No issues with breathing and feels fine overall.      Past Medical History:  Diagnosis Date   Allergic rhinitis    Atrial fibrillation (HCC)    Depression    Dysrhythmia    History of ETOH abuse    Hx of degenerative disc disease    S/P mitral valve clip implantation 10/25/2022   3x PASCAL ACE implantation with Dr. Lynnette Caffey and Dr. Excell Seltzer    Past Surgical History:  Procedure Laterality Date   ABDOMINAL AORTOGRAM N/A 07/18/2022   Procedure: ABDOMINAL AORTOGRAM;  Surgeon: Corky Crafts, MD;  Location[facility]VASIVE CV LAB;  Service: Cardiovascular;  Laterality: N/A;   BUBBLE STUDY  05/30/2022   Procedure: BUBBLE STUDY;  Surgeon: Sande Rives, MD;  Location: [facility] ENDOSCOPY;  Service: Cardiovascular;;   CATARACT EXTRACTION BILATERAL W/ ANTERIOR VITRECTOMY Bilateral 2015   INGUINAL HERNIA REPAIR Right 1982   RIGHT/LEFT HEART CATH AND CORONARY ANGIOGRAPHY N/A 07/18/2022   Procedure: RIGHT/LEFT HEART CATH AND CORONARY ANGIOGRAPHY;  Surgeon: Corky Crafts, MD;  Location: [facility] INVASIVE CV LAB;  Service: Cardiovascular;  Laterality: N/A;   TEE WITHOUT CARDIOVERSION N/A 05/30/2022   Procedure: TRANSESOPHAGEAL ECHOCARDIOGRAM (TEE);  Surgeon: Sande Rives, MD;  Location: [facility] ENDOSCOPY;  Service: Cardiovascular;  Laterality: N/A;   TEE WITHOUT CARDIOVERSION N/A 10/25/2022   Procedure: TRANSESOPHAGEAL ECHOCARDIOGRAM;  Surgeon: Orbie Pyo, MD;  Location: [facility] INVASIVE CV LAB;  Service: Cardiovascular;  Laterality: N/A;   TRANSCATHETER MITRAL EDGE TO EDGE REPAIR N/A 10/25/2022   Procedure: MITRAL VALVE REPAIR;  Surgeon: Orbie Pyo, MD;  Location: [facility] INVASIVE CV LAB;  Service: Cardiovascular;  Laterality: N/A;    Family History  Problem Relation Age of Onset   Alcoholism Mother        Died age 59   Stroke Brother    Cancer Brother        Liver/lung    Social History   Socioeconomic History   Marital status: Single    Spouse name:  Not on file   Number of children: 2   Years of education: Not on file   Highest education level: Not on file  Occupational History   Occupation: Retired   Tobacco Use   Smoking status: Never   Smokeless tobacco: Never  Vaping Use   Vaping status: Never Used  Substance and Sexual Activity   Alcohol use: Yes    Alcohol/week: 13.0 standard drinks of alcohol    Types: 8 Cans of beer, 5 Shots of liquor per week    Comment: Daily (4-5 beers, 1-2 shots - 4-5 days a week)   Drug use: Never   Sexual activity: Yes    Partners: Female  Other Topics Concern   Not on file  Social History Narrative   ** Merged History Encounter **       Relocated  to [facility] from [place] 08/2015 months ago after significant hospital stay with UTI leading to sepsis. Went to rehab for a few months afterwards. Sister in law only family in [place]  Son lives in [place] Daughter lives in [place]    (addiction counselor)    Social Drivers of Health   Financial Resource Strain: Low Risk  (11/03/2020)   Overall Financial Resource Strain (CARDIA)    Difficulty of Paying Living Expenses: Not hard at all  Food Insecurity: No Food Insecurity (10/29/2022)   Hunger Vital Sign    Worried About Running Out of Food in the Last Year: Never true    Ran Out of Food in the Last Year: Never true  Transportation Needs: No Transportation Needs (10/29/2022)   PRAPARE - Administrator, Civil Service (Medical): No    Lack of Transportation (Non-Medical): No  Physical Activity: Insufficiently Active (11/03/2020)   Exercise Vital Sign    Days of Exercise per Week: 4 days    Minutes of Exercise per Session: 30 min  Stress: No Stress Concern Present (11/03/2020)   [employer] of Occupational Health - Occupational Stress Questionnaire    Feeling of Stress : Only a little  Social Connections: Socially Isolated (11/03/2020)   Social Connection and Isolation Panel [NHANES]    Frequency of Communication with  Friends and Family: Twice a week    Frequency of Social Gatherings with Friends and Family: Once a week    Attends Religious Services: Never    Database administrator or Organizations: No    Attends Banker Meetings: Never    Marital Status: Never married  Intimate Partner Violence: Not At Risk (11/03/2020)   Humiliation, Afraid, Rape, and Kick questionnaire    Fear of Current or Ex-Partner: No    Emotionally Abused: No    Physically Abused: No    Sexually Abused: No     Prior to Admission medications   Medication Sig Start Date End Date Taking? Authorizing Provider  Acetaminophen-DM (COLD & COUGH DAYTIME PO) Take 1 tablet by mouth daily. Patient not taking: Reported on 11/04/2023    [provider]  amoxicillin (AMOXIL) 500 MG capsule Take 4 capsules (2,000 mg total) by mouth as directed. Take 4 tablets 1 hour prior to dental work, including cleanings. 11/04/23   Janetta Hora, PA-C  B Complex-C (B-COMPLEX WITH VITAMIN C) tablet Take 1 tablet by mouth daily.    [provider]  digoxin (LANOXIN) 0.125 MG tablet Take 1 tablet (0.125 mg total) by mouth daily. 10/31/23   Rollene Rotunda, MD  Doxylamine Succinate, Sleep, (SLEEP AID PO) Take 1-2 tablets by mouth at bedtime as needed (sleep).    [provider]  warfarin (COUMADIN) 3 MG tablet Take 1 tablet (3 mg total) by mouth daily. 09/24/23   Rollene Rotunda, MD    No Known Allergies  REVIEW OF SYSTEMS:  General: no fevers/chills/night sweats Eyes: no blurry vision, diplopia, or amaurosis ENT: no sore throat or hearing loss Resp: no cough, wheezing, or hemoptysis CV: no edema or palpitations GI: no abdominal pain, nausea, vomiting, diarrhea, or constipation GU: no dysuria, frequency, or hematuria Skin: no rash Neuro: no headache, numbness, tingling, or weakness of extremities Musculoskeletal: no joint pain or swelling Heme: no bleeding, DVT, or easy bruising Endo: no polydipsia or  polyuria  BP 116/62   Pulse 72   Ht 5\' 9"  (1.753 m)   Wt 142 lb (64.4 kg)   SpO2 96%  BMI 20.97 kg/m   PHYSICAL EXAM: GEN:  AO x 3 in no acute distress HEENT: normal Dentition: Normal Neck: JVP normal. +2 carotid upstrokes without bruits. No thyromegaly. Lungs: equal expansion, clear bilaterally CV: Apex is discrete and nondisplaced, RRR with 2 out of 6 holosystolic murmur axilla Abd: soft, non-tender, non-distended; no bruit; positive bowel sounds Ext: no edema, ecchymoses, or cyanosis Vascular: 2+ femoral pulses, 2+ radial pulses       Skin: warm and dry without rash Neuro: CN II-XII grossly intact; motor and sensory grossly intact    DATA AND STUDIES:  EKG: EKG from March 2024 demonstrates rate controlled atrial fibrillation with occasional PVCs  EKG Interpretation Date/Time:    Ventricular Rate:    PR Interval:    QRS Duration:    QT Interval:    QTC Calculation:   R Axis:      Text Interpretation:          Cardiac Studies & Procedures   ______________________________________________________________________________________________ CARDIAC CATHETERIZATION  CARDIAC CATHETERIZATION 10/25/2022  Narrative Successful mitral transcatheter edge-to-edge repair with 3 PASCAL ACE devices reducing severe to trace MR with residual mean gradient of (at HR of 50bpm).   CARDIAC CATHETERIZATION  CARDIAC CATHETERIZATION 07/18/2022  Narrative   Mid LAD lesion is 40% stenosed.   LV end diastolic pressure is mildly elevated.   Hemodynamic findings consistent with mild pulmonary hypertension.   There is no aortic valve stenosis.   Aortic saturation 97%, PA saturation 65%, mean RA pressure 11 mmHg, PA pressure 46/20, mean PA pressure 34 mmHg, pulmonary capillary wedge pressure 23/31, mean pulmonary capillary wedge pressure 21 mmHg, significant V waves noted, cardiac output 4.68 L/min, cardiac index 2.44.   Heavily calcified right radial artery with difficulty removing  as noted in the procedural narrative.  Although the catheterization could be completed from the right radial approach, would rec and a different Roche if future Authorization was needed.   No abdominal aortic aneurysm.  Very tortuous right common iliac artery.  Mild, nonobstructive coronary artery disease.  No abdominal aortic aneurysm.  Tortuous right common iliac.  Findings Coronary Findings Diagnostic  Dominance: Right  Left Anterior Descending The vessel exhibits minimal luminal irregularities. Mid LAD lesion is 40% stenosed.  Left Circumflex The vessel exhibits minimal luminal irregularities.  Right Coronary Artery Vessel is large. The vessel exhibits minimal luminal irregularities.  Intervention  No interventions have been documented.   STRESS TESTS  EXERCISE TOLERANCE TEST (ETT) 08/28/2022  Narrative   No ST deviation was noted.   Decreased exercise effort, 4 minutes and 31 seconds on treadmill.  Achieved 6 oint 4 METS.  No angina.   Atrial fibrillation was noted at baseline.  Atrial fibrillation with rapid ventricular response up to 171 bpm was noted during stress.   Occasional PVCs noted, rare couplet.   Overall mild to moderate risk exercise treadmill test mostly secondary to decreased exercise effort.  There was no obvious ST segment depression during exercise.  Atrial fibrillation noted throughout testing.   ECHOCARDIOGRAM  ECHOCARDIOGRAM COMPLETE 11/04/2023  Narrative ECHOCARDIOGRAM REPORT    Patient Name:   Cameron Patton Date of Exam: 11/04/2023 Medical Rec #:  [id number]     Height:       69.0 in Accession #:    [id number]    Weight:       164.2 lb Date of Birth:  [birth year]     BSA:          1.900 m Patient Age:  78 years      BP:           124/72 mmHg Patient Gender: M             HR:           85 bpm. Exam Location:  [address]  Procedure: 2D Echo, 3D Echo, Cardiac Doppler and Color Doppler (Both Spectral and Color Flow Doppler were utilized  during procedure).  Indications:    Z95.2 Status Post MitraClip  History:        Patient has prior history of Echocardiogram examinations, most recent 10/25/2022. Mitral Valve Disease, Arrythmias:Atrial Fibrillation; Risk Factors:Hypertension. ETOH Abuse, Status Post MitraClip (10-25-22, x3 Pascal ACE Implantation).  Mitral Valve: Mitra-Clip valve is present in the mitral position. Procedure Date: 10/25/2022.  Sonographer:    Farrel Conners RDCS Referring Phys: Deri Fuelling MCDANIEL  IMPRESSIONS   1. Left ventricular ejection fraction, by estimation, is 55 to 60%. The left ventricle has normal function. The left ventricle has no regional wall motion abnormalities. Left ventricular diastolic function could not be evaluated. 2. Right ventricular systolic function is low normal. The right ventricular size is moderately enlarged. There is severely elevated pulmonary artery systolic pressure. The estimated right ventricular systolic pressure is 61.2 mmHg. 3. Left atrial size was severely dilated. 4. Right atrial size was severely dilated. 5. The mitral valve has been repaired/replaced. Moderate mitral valve regurgitation. Mild mitral stenosis. The mean mitral valve gradient is 7.0 mmHg. There is a Mitra-Clip present in the mitral position. Procedure Date: 10/25/2022. 6. Tricuspid valve regurgitation is moderate. 7. The aortic valve is tricuspid. There is mild calcification of the aortic valve. Aortic valve regurgitation is not visualized. Aortic valve sclerosis is present, with no evidence of aortic valve stenosis. 8. The inferior vena cava is dilated in size with <50% respiratory variability, suggesting right atrial pressure of 15 mmHg. 9. Evidence of atrial level shunting detected by color flow Doppler. There is a moderately sized atrial septal defect with predominantly left to right shunting across the atrial septum.  Comparison(s): Prior images unable to be directly viewed, comparison made by  report only. LVEF slightly improved, RV has enlarged compared to prior study.  FINDINGS Left Ventricle: Left ventricular ejection fraction, by estimation, is 55 to 60%. The left ventricle has normal function. The left ventricle has no regional wall motion abnormalities. 3D ejection fraction reviewed and evaluated as part of the interpretation. Alternate measurement of EF is felt to be most reflective of LV function. The left ventricular internal cavity size was normal in size. There is no left ventricular hypertrophy. Left ventricular diastolic function could not be evaluated due to atrial fibrillation. Left ventricular diastolic function could not be evaluated.  Right Ventricle: The right ventricular size is moderately enlarged. Right vetricular wall thickness was not well visualized. Right ventricular systolic function is low normal. There is severely elevated pulmonary artery systolic pressure. The tricuspid regurgitant velocity is 3.40 m/s, and with an assumed right atrial pressure of 15 mmHg, the estimated right ventricular systolic pressure is 61.2 mmHg.  Left Atrium: Left atrial size was severely dilated.  Right Atrium: Right atrial size was severely dilated.  Pericardium: There is no evidence of pericardial effusion.  Mitral Valve: MVA 1.77 cm2 by DT. The mitral valve has been repaired/replaced. Moderate mitral valve regurgitation. There is a Mitra-Clip present in the mitral position. Procedure Date: 10/25/2022. Mild mitral valve stenosis. MV peak gradient, 20.1 mmHg. The mean mitral valve gradient is 7.0 mmHg.  Tricuspid Valve: The tricuspid valve is normal in structure. Tricuspid valve regurgitation is moderate . No evidence of tricuspid stenosis.  Aortic Valve: The aortic valve is tricuspid. There is mild calcification of the aortic valve. Aortic valve regurgitation is not visualized. Aortic valve sclerosis is present, with no evidence of aortic valve stenosis. Aortic valve mean  gradient measures 5.5 mmHg. Aortic valve peak gradient measures 9.9 mmHg. Aortic valve area, by VTI measures 1.83 cm.  Pulmonic Valve: The pulmonic valve was grossly normal. Pulmonic valve regurgitation is mild. No evidence of pulmonic stenosis.  Aorta: The aortic root, ascending aorta, aortic arch and descending aorta are all structurally normal, with no evidence of dilitation or obstruction.  Venous: The inferior vena cava is dilated in size with less than 50% respiratory variability, suggesting right atrial pressure of 15 mmHg.  IAS/Shunts: Evidence of atrial level shunting detected by color flow Doppler. There is a moderately sized atrial septal defect with predominantly left to right shunting across the atrial septum.  Additional Comments: 3D was performed not requiring image post processing on an independent workstation and was normal.   LEFT VENTRICLE PLAX 2D LVIDd:         4.90 cm   Diastology LVIDs:         3.00 cm   LV e' medial:    5.82 cm/s LV PW:         1.00 cm   LV E/e' medial:  39.7 LV IVS:        0.80 cm   LV e' lateral:   10.30 cm/s LVOT diam:     2.40 cm   LV E/e' lateral: 22.4 LV SV:         55 LV SV Index:   29 LVOT Area:     4.52 cm  3D Volume EF: 3D EF:        74 % LV EDV:       168 ml LV ESV:       43 ml LV SV:        124 ml  RIGHT VENTRICLE RV Basal diam:  4.60 cm RV Mid diam:    3.30 cm RV S prime:     14.00 cm/s TAPSE (M-mode): 1.4 cm RVSP:           49.2 mmHg  LEFT ATRIUM              Index         RIGHT ATRIUM           Index LA diam:        6.70 cm  3.53 cm/m    RA Pressure: 3.00 mmHg LA Vol (A2C):   302.0 ml 158.96 ml/m  RA Area:     50.30 cm LA Vol (A4C):   217.0 ml 114.22 ml/m  RA Volume:   239.00 ml 125.80 ml/m LA Biplane Vol: 264.0 ml 138.96 ml/m AORTIC VALVE AV Area (Vmax):    2.06 cm AV Area (Vmean):   1.92 cm AV Area (VTI):     1.83 cm AV Vmax:           157.50 cm/s AV Vmean:          106.000 cm/s AV VTI:             0.298 m AV Peak Grad:      9.9 mmHg AV Mean Grad:      5.5 mmHg LVOT Vmax:         71.60  cm/s LVOT Vmean:        45.050 cm/s LVOT VTI:          0.120 m LVOT/AV VTI ratio: 0.41  AORTA Ao Root diam: 3.00 cm Ao Asc diam:  3.60 cm  MITRAL VALVE                TRICUSPID VALVE MV Area (PHT): cm          TR Peak grad:   46.2 mmHg MV Area VTI:   0.99 cm     TR Vmax:        340.00 cm/s MV Peak grad:  20.1 mmHg    Estimated RAP:  3.00 mmHg MV Mean grad:  7.0 mmHg     RVSP:           49.2 mmHg MV Vmax:       2.24 m/s MV Vmean:      119.0 cm/s   SHUNTS MV Decel Time: 437 msec     Systemic VTI:  0.12 m MR Peak grad: 131.2 mmHg    Systemic Diam: 2.40 cm MR Mean grad: 85.3 mmHg MR Vmax:      572.67 cm/s MR Vmean:     434.3 cm/s MV E velocity: 231.00 cm/s  Jodelle Red MD Electronically signed by Jodelle Red MD Signature Date/Time: 11/04/2023/5:24:53 PM    Final   TEE  ECHO TEE 10/25/2022  Narrative TRANSESOPHOGEAL ECHO REPORT    Patient Name:   Cameron Patton Date of Exam: 10/25/2022 Medical Rec #:  [id number]     Height:       69.0 in Accession #:    [id number]    Weight:       162.0 lb Date of Birth:  Sep 12, 1945     BSA:          1.889 m Patient Age:    76 years      BP:           162/86 mmHg Patient Gender: M             HR:           90 bpm. Exam Location:  Inpatient  Procedure: Transesophageal Echo, 3D Echo, Color Doppler and Cardiac Doppler  Indications:     Mitral Regurgitation i34.0  History:         Patient has prior history of Echocardiogram examinations, most recent 05/30/2022. Arrythmias:Atrial Fibrillation; Risk Factors:ETOH.  Sonographer:     Irving Burton Senior RDCS Referring Phys:  [id number] Orbie Pyo Diagnosing Phys: Riley Lam MD   Sonographer Comments: Mitral TEER with 3 Pascal clips   PROCEDURE: After discussion of the risks and benefits of a TEE, an informed consent was obtained from the patient. The transesophogeal probe  was passed without difficulty through the esophogus of the patient. Sedation performed by different physician. The patient was monitored while under deep sedation. The patient developed no complications during the procedure.  IMPRESSIONS   1. Prior to procedure there was severe mitral valve regurgitation. There was rare systolic flow reversal of the right sided pulmonary veins. 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 There wer multiple jets with the largest being medial ~ A3/P3. Left ventricular ejection fraction, by estimation, is 60 to 65%. The left ventricle has normal function. Mean gradient of 1 mm Hg with normal 3D MVA. 2. A transeptal puncture was performed by with an overly posterior approach. A more mid-mid approach was performed and but prior to guide  moving to the left atrium clot was seen. System was removed and the mid-mid approach was performed. 3. A Pascal ACE was placed in the medial A3/P3. This lateralized the rest of the regurgitation. Mean gradient of 1 mm Hg. 4. A second Pascal ACE awas placed A2/P2 next to the original device. Mild to modreate mitral regurgitation, MVA 6 cm2 with mean gradient of 31m. 5. A third Pascal ACE was placed lateral to the prior two. Trace MR. Mean gradient of 3.5 mm Hg. 6. No change in pericardial effusion. 7. Right ventricular systolic function is normal. The right ventricular size is moderately enlarged. 8. Left atrial size was severely dilated. No left atrial/left atrial appendage thrombus was detected. 9. Right atrial size was severely dilated. 10. Tricuspid valve regurgitation is mild to moderate. 11. The aortic valve is tricuspid. Aortic valve regurgitation is not visualized. No aortic stenosis is present. 12. There is Moderate (Grade III) plaque involving the descending aorta. 13. Evidence of atrial level shunting detected by color flow Doppler.  Conclusion(s)/Recommendation(s): Successful placement of 3 Pascal  devices.  FINDINGS Left Ventricle: Left ventricular ejection fraction, by estimation, is 60 to 65%. The left ventricle has normal function. The left ventricular internal cavity size was normal in size.  Right Ventricle: The right ventricular size is moderately enlarged. Right vetricular wall thickness was not well visualized. Right ventricular systolic function is normal.  Left Atrium: Left atrial size was severely dilated. No left atrial/left atrial appendage thrombus was detected.  Right Atrium: Right atrial size was severely dilated.  Pericardium: Trivial pericardial effusion is present. The pericardial effusion is surrounding the apex.  Mitral Valve: The mitral valve has been repaired/replaced. Trivial mitral valve regurgitation. MV peak gradient, 4.4 mmHg. The mean mitral valve gradient is 1.3 mmHg.  Tricuspid Valve: The tricuspid valve is normal in structure. Tricuspid valve regurgitation is mild to moderate. No evidence of tricuspid stenosis.  Aortic Valve: The aortic valve is tricuspid. Aortic valve regurgitation is not visualized. No aortic stenosis is present.  Pulmonic Valve: The pulmonic valve was grossly normal. Pulmonic valve regurgitation is trivial.  Aorta: The aortic root and ascending aorta are structurally normal, with no evidence of dilitation. There is moderate (Grade III) plaque involving the descending aorta.  IAS/Shunts: Evidence of atrial level shunting detected by color flow Doppler.  Additional Comments: Spectral Doppler performed.  AORTIC VALVE LVOT Vmax:   56.50 cm/s LVOT Vmean:  36.800 cm/s LVOT VTI:    0.116 m  MITRAL VALVE MV Peak grad: 4.4 mmHg        SHUNTS MV Mean grad: 1.3 mmHg        Systemic VTI: 0.12 m MV Vmax:      1.05 m/s MV Vmean:     50.6 cm/s MR Peak grad:    135.0 mmHg MR Mean grad:    85.0 mmHg MR Vmax:         581.00 cm/s MR Vmean:        430.0 cm/s MR PISA:         2.65 cm MR PISA Eff ROA: 18 mm MR PISA Radius:  0.65  cm  Riley Lam MD Electronically signed by Riley Lam MD Signature Date/Time: 10/25/2022/1:14:24 PM    Final        ______________________________________________________________________________________________      11/04/2023: ALT 21; BUN 19; Creatinine, Ser 0.78; Hemoglobin 12.3; Platelets 208; Potassium 4.8; Sodium 138    NHYA CLASS: 1     ASSESSMENT AND PLAN:   1.  ASD (atrial septal defect)   2. Right ventricular dilation   3. S/P mitral valve clip implantation   4. Nonrheumatic tricuspid valve regurgitation   5. Longstanding persistent atrial fibrillation (HCC)   6. Secondary hypercoagulable state (HCC)   7. Hyperlipidemia LDL goal <70   8. Aortic atherosclerosis (HCC)     ASD: The patient's iatrogenic ASD shows left-to-right flow.  I reviewed the patient's echocardiogram prior to mitral transcatheter edge-to-edge repair and I reviewed his most recent echocardiogram.  He demonstrates RV dilatation on both echocardiograms.  My sense is that his RV and RA are enlarged from longstanding atrial fibrillation.  I spoke with the patient about right heart catheterization to measure Qp/Qs.  I will refer him for this procedure.  Because he does not have anyone to drive him I will not administer sedation during the procedure and only give the patient local anesthesia. Right ventricular dilatation: Looks relatively the same on my review of serial echocardiograms.  Will refer for right heart catheterization to evaluate Qp/Qs. 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 TR: Mild to moderate; continue medical therapy. Persistent atrial fibrillation: Continue warfarin 3 mg: Followed by INR clinic. Secondary hypercoagulable state: Continue Hyperlipidemia: Per primary cardiologist. Aortic atherosclerosis: Per primary cardiologist:  I have personally reviewed the patients imaging data as summarized above.  I have reviewed the  natural history of mitral regurgitation with the patient and family members who are present today. We have discussed the limitations of medical therapy and the poor prognosis associated with symptomatic mitral regurgitation. We have also reviewed potential treatment options, including palliative medical therapy, conventional mitral surgery, and transcatheter mitral edge-to-edge repair. We discussed treatment options in the context of this patient's specific comorbid medical conditions.   All of the patient's questions were answered today. Will make further recommendations based on the results of studies outlined above.   I spent 33 minutes reviewing all clinical data during and prior to this visit including all relevant imaging studies, laboratories, clinical information from other health systems and prior notes from both Cardiology and other specialties, interviewing the patient, conducting a complete physical examination, and coordinating care in order to formulate a comprehensive and personalized evaluation and treatment plan.   Orbie Pyo, MD  11/20/2023 5:01 PM    [facility] [address] [place], [place], [place]  [address] Phone: [phone]; Fax: [phone]

## 2023-11-18 ENCOUNTER — Ambulatory Visit: Admitting: Internal Medicine

## 2023-11-20 ENCOUNTER — Ambulatory Visit: Attending: Internal Medicine | Admitting: Internal Medicine

## 2023-11-20 ENCOUNTER — Encounter: Payer: Self-pay | Admitting: Internal Medicine

## 2023-11-20 VITALS — BP 116/62 | HR 72 | Ht 69.0 in | Wt 142.0 lb

## 2023-11-20 DIAGNOSIS — E785 Hyperlipidemia, unspecified: Secondary | ICD-10-CM

## 2023-11-20 DIAGNOSIS — I361 Nonrheumatic tricuspid (valve) insufficiency: Secondary | ICD-10-CM

## 2023-11-20 DIAGNOSIS — I700 Atherosclerosis of aorta: Secondary | ICD-10-CM | POA: Diagnosis not present

## 2023-11-20 DIAGNOSIS — I4811 Longstanding persistent atrial fibrillation: Secondary | ICD-10-CM

## 2023-11-20 DIAGNOSIS — Z95818 Presence of other cardiac implants and grafts: Secondary | ICD-10-CM | POA: Diagnosis not present

## 2023-11-20 DIAGNOSIS — I517 Cardiomegaly: Secondary | ICD-10-CM | POA: Diagnosis not present

## 2023-11-20 DIAGNOSIS — D6869 Other thrombophilia: Secondary | ICD-10-CM

## 2023-11-20 DIAGNOSIS — Q211 Atrial septal defect, unspecified: Secondary | ICD-10-CM | POA: Diagnosis not present

## 2023-11-20 DIAGNOSIS — Z9889 Other specified postprocedural states: Secondary | ICD-10-CM | POA: Diagnosis not present

## 2023-11-20 NOTE — Patient Instructions (Addendum)
 Medication Instructions:  Your physician recommends that you continue on your current medications as directed. Please refer to the Current Medication list given to you today.  *If you need a refill on your cardiac medications before your next appointment, please call your pharmacy*  Lab Work: None ordered today. If you have labs (blood work) drawn today and your tests are completely normal, you will receive your results only by: MyChart Message (if you have MyChart) OR A paper copy in the mail If you have any lab test that is abnormal or we need to change your treatment, we will call you to review the results.  Testing/Procedures: Your physician has requested that you have a right heart catherization on 12/03/23 at 7:30 AM. Please arrive at the hospital by 5:30 AM.  Follow-Up: At [facility], you and your health needs are our priority.  As part of our continuing mission to provide you with exceptional heart care, we have created designated Provider Care Teams.  These Care Teams include your primary Cardiologist (physician) and Advanced Practice Providers (APPs -  Physician Assistants and Nurse Practitioners) who all work together to provide you with the care you need, when you need it.  We recommend signing up for the patient portal called "MyChart".  Sign up information is provided on this After Visit Summary.  MyChart is used to connect with patients for Virtual Visits (Telemedicine).  Patients are able to view lab/test results, encounter notes, upcoming appointments, etc.  Non-urgent messages can be sent to your provider as well.   To learn more about what you can do with MyChart, go to [email or web address].    Your next appointment:   TBD  Other Instructions       Cardiac/Peripheral Catheterization   You are scheduled for a Cardiac Catheterization on Tuesday, April 22 with Dr. Alverda Skeans.  1. Please arrive at the [facility] (Main Entrance A) at [facility]: [address] [place], [place] [address] at 5:30 AM (This time is 2 hour(s) before your procedure to ensure your preparation). Your procedure is scheduled to begin at 7:30 AM.  Free valet parking service is available. You will check in at ADMITTING. The support person will be asked to wait in the waiting room.  It is OK to have someone drop you off and come back when you are ready to be discharged.        Special note: Every effort is made to have your procedure done on time. Please understand that emergencies sometimes delay scheduled procedures.  2. Diet: Do not eat solid foods after midnight.  You may have clear liquids until 5 AM the day of the procedure.  3. Labs: You do not need to have labs drawn. (Last CMP and CBC drawn on 11/04/23  4. Medication instructions in preparation for your procedure:   Contrast Allergy: No  Per Dr. Lynnette Caffey, HOLD Warfarin the day of the procedure only.  On the morning of your procedure, take Aspirin 81 mg and any morning medicines NOT listed above.  You may use sips of water.  5. Plan to go home the same day, you will only stay overnight if medically necessary. 6. You MUST have a responsible adult to drive you home. 7. An adult MUST be with you the first 24 hours after you arrive home. 8. Bring a current list of your medications, and the last time and date medication taken. 9. Bring ID and current insurance cards. 10.Please wear clothes that are easy  to get on and off and wear slip-on shoes.  Thank you for allowing [place] to care for you!   -- [facility]

## 2023-11-28 ENCOUNTER — Ambulatory Visit: Attending: Cardiology | Admitting: *Deleted

## 2023-11-28 DIAGNOSIS — Z5181 Encounter for therapeutic drug level monitoring: Secondary | ICD-10-CM

## 2023-11-28 DIAGNOSIS — Z7901 Long term (current) use of anticoagulants: Secondary | ICD-10-CM

## 2023-11-28 LAB — POCT INR: POC INR: 2.6

## 2023-11-28 NOTE — Patient Instructions (Signed)
 Description   Continue taking warfarin 1 tablet daily except for 1/2 a tablet on Monday and Fridays. Recheck INR in 4 weeks.  Anticoagulation Clinic [phone]      1st Floor: - Lobby - Registration  - Pharmacy  - Lab - Cafe  2nd Floor: - PV Lab - Diagnostic Testing (echo, CT, nuclear med)  3rd Floor: - Vacant  4th Floor: - TCTS (cardiothoracic surgery) - AFib Clinic - Structural Heart Clinic - Vascular Surgery  - Vascular Ultrasound  5th Floor: - HeartCare Cardiology (general and EP) - Clinical Pharmacy for coumadin, hypertension, lipid, weight-loss medications, and med management appointments    Valet parking services will be available as well.

## 2023-12-02 ENCOUNTER — Other Ambulatory Visit: Payer: Self-pay | Admitting: Cardiology

## 2023-12-02 ENCOUNTER — Telehealth: Payer: Self-pay | Admitting: *Deleted

## 2023-12-02 NOTE — Telephone Encounter (Signed)
 Copied from Dr Lorie Rook 11/20/23 Office Note:  Because he does not have anyone to drive him I will not administer sedation during the procedure and only give the patient local anesthesia.

## 2023-12-02 NOTE — Telephone Encounter (Signed)
 Right Heart Cath scheduled at [facility] for: Tuesday December 03, 2023 7:30 AM Arrival time [facility] Main Entrance A at: 5:30 AM  Nothing to eat after midnight prior to procedure, clear liquids until 5 AM day of procedure.  Medication instructions: -Hold:  Copied from 11/20/23 Procedure Instructions:   Per Dr. Lorie Rook, HOLD Warfarin the day of the procedure only.  -Other usual morning medications can be taken with sips of water.  Plan to go home the same day, you will only stay overnight if medically necessary.  You must have responsible adult to drive you home.-see note  Someone must be with you the first 24 hours after you arrive home-see note  Copied from Dr Lorie Rook 11/20/23 Office Note:  Because he does not have anyone to drive him I will not administer sedation during the procedure and only give the patient local anesthesia.

## 2023-12-03 ENCOUNTER — Encounter (HOSPITAL_COMMUNITY): Payer: Self-pay | Admitting: Internal Medicine

## 2023-12-03 ENCOUNTER — Encounter: Payer: Self-pay | Admitting: Cardiology

## 2023-12-03 ENCOUNTER — Other Ambulatory Visit: Payer: Self-pay

## 2023-12-03 ENCOUNTER — Encounter (HOSPITAL_COMMUNITY)
Admission: RE | Disposition: A | Discharge status: Home / Self Care | Payer: Self-pay | Source: Home / Self Care | Attending: Internal Medicine

## 2023-12-03 ENCOUNTER — Ambulatory Visit (HOSPITAL_COMMUNITY)
Admission: RE | Admit: 2023-12-03 | Discharge: 2023-12-03 | Disposition: A | Discharge status: Home / Self Care | Attending: Internal Medicine | Admitting: Internal Medicine

## 2023-12-03 DIAGNOSIS — D6869 Other thrombophilia: Secondary | ICD-10-CM | POA: Diagnosis not present

## 2023-12-03 DIAGNOSIS — Z7901 Long term (current) use of anticoagulants: Secondary | ICD-10-CM | POA: Insufficient documentation

## 2023-12-03 DIAGNOSIS — I071 Rheumatic tricuspid insufficiency: Secondary | ICD-10-CM | POA: Diagnosis not present

## 2023-12-03 DIAGNOSIS — E785 Hyperlipidemia, unspecified: Secondary | ICD-10-CM | POA: Insufficient documentation

## 2023-12-03 DIAGNOSIS — Q211 Atrial septal defect, unspecified: Secondary | ICD-10-CM | POA: Diagnosis not present

## 2023-12-03 DIAGNOSIS — I4811 Longstanding persistent atrial fibrillation: Secondary | ICD-10-CM | POA: Insufficient documentation

## 2023-12-03 DIAGNOSIS — I700 Atherosclerosis of aorta: Secondary | ICD-10-CM | POA: Insufficient documentation

## 2023-12-03 DIAGNOSIS — Z01812 Encounter for preprocedural laboratory examination: Secondary | ICD-10-CM

## 2023-12-03 HISTORY — PX: RIGHT HEART CATH: CATH118263

## 2023-12-03 LAB — POCT I-STAT EG7
Acid-Base Excess: 3 mmol/L — ABNORMAL HIGH (ref 0.0–2.0)
Acid-Base Excess: 3 mmol/L — ABNORMAL HIGH (ref 0.0–2.0)
Acid-Base Excess: 3 mmol/L — ABNORMAL HIGH (ref 0.0–2.0)
Bicarbonate: 28.4 mmol/L — ABNORMAL HIGH (ref 20.0–28.0)
Bicarbonate: 29.4 mmol/L — ABNORMAL HIGH (ref 20.0–28.0)
Bicarbonate: 29.5 mmol/L — ABNORMAL HIGH (ref 20.0–28.0)
Calcium, Ion: 1.23 mmol/L (ref 1.15–1.40)
Calcium, Ion: 1.24 mmol/L (ref 1.15–1.40)
Calcium, Ion: 1.24 mmol/L (ref 1.15–1.40)
HCT: 36 % — ABNORMAL LOW (ref 39.0–52.0)
HCT: 36 % — ABNORMAL LOW (ref 39.0–52.0)
HCT: 35 % — ABNORMAL LOW (ref 39.0–52.0)
Hemoglobin: 12.2 g/dL — ABNORMAL LOW (ref 13.0–17.0)
Hemoglobin: 12.2 g/dL — ABNORMAL LOW (ref 13.0–17.0)
Hemoglobin: 11.9 g/dL — ABNORMAL LOW (ref 13.0–17.0)
O2 Saturation: 59 %
O2 Saturation: 66 %
O2 Saturation: 60 %
Potassium: 4.3 mmol/L (ref 3.5–5.1)
Potassium: 4.4 mmol/L (ref 3.5–5.1)
Potassium: 4.3 mmol/L (ref 3.5–5.1)
Sodium: 137 mmol/L (ref 135–145)
Sodium: 137 mmol/L (ref 135–145)
Sodium: 137 mmol/L (ref 135–145)
TCO2: 30 mmol/L (ref 22–32)
TCO2: 31 mmol/L (ref 22–32)
TCO2: 31 mmol/L (ref 22–32)
pCO2, Ven: 47.9 mmHg (ref 44–60)
pCO2, Ven: 52.1 mmHg (ref 44–60)
pCO2, Ven: 52.3 mmHg (ref 44–60)
pH, Ven: 7.359 (ref 7.25–7.43)
pH, Ven: 7.381 (ref 7.25–7.43)
pH, Ven: 7.359 (ref 7.25–7.43)
pO2, Ven: 33 mmHg (ref 32–45)
pO2, Ven: 35 mmHg (ref 32–45)
pO2, Ven: 33 mmHg (ref 32–45)

## 2023-12-03 LAB — PROTIME-INR
INR: 2.2 — ABNORMAL HIGH (ref 0.8–1.2)
Prothrombin Time: 24.6 s — ABNORMAL HIGH (ref 11.4–15.2)

## 2023-12-03 SURGERY — RIGHT HEART CATH

## 2023-12-03 MED ORDER — LIDOCAINE HCL (PF) 1 % IJ SOLN
INTRAMUSCULAR | Status: DC | PRN
  Administered 2023-12-03: 2 mL via INTRADERMAL

## 2023-12-03 MED ORDER — HYDRALAZINE HCL 20 MG/ML IJ SOLN: 10.0000 mg | INTRAMUSCULAR | Status: DC | PRN

## 2023-12-03 MED ORDER — SODIUM CHLORIDE 0.9 % IV SOLN: INTRAVENOUS | Status: DC

## 2023-12-03 MED ORDER — WARFARIN SODIUM 3 MG PO TABS: ORAL_TABLET | ORAL | 1 refills | Status: DC

## 2023-12-03 MED ORDER — ONDANSETRON HCL 4 MG/2ML IJ SOLN: 4.0000 mg | Freq: Four times a day (QID) | INTRAMUSCULAR | Status: DC | PRN

## 2023-12-03 MED ORDER — LABETALOL HCL 5 MG/ML IV SOLN: 10.0000 mg | INTRAVENOUS | Status: DC | PRN

## 2023-12-03 MED ORDER — SODIUM CHLORIDE 0.9 % IV SOLN: 250.0000 mL | INTRAVENOUS | Status: DC | PRN

## 2023-12-03 MED ORDER — ACETAMINOPHEN 325 MG PO TABS: 650.0000 mg | ORAL_TABLET | ORAL | Status: DC | PRN

## 2023-12-03 MED ORDER — SODIUM CHLORIDE 0.9% FLUSH: 3.0000 mL | INTRAVENOUS | Status: DC | PRN

## 2023-12-03 MED ORDER — LIDOCAINE HCL (PF) 1 % IJ SOLN
INTRAMUSCULAR | Status: AC
  Filled 2023-12-03: qty 30

## 2023-12-03 MED ORDER — HEPARIN (PORCINE) IN NACL 1000-0.9 UT/500ML-% IV SOLN
INTRAVENOUS | Status: DC | PRN
  Administered 2023-12-03: 500 mL

## 2023-12-03 MED ORDER — SODIUM CHLORIDE 0.9% FLUSH: 3.0000 mL | Freq: Two times a day (BID) | INTRAVENOUS | Status: DC

## 2023-12-03 SURGICAL SUPPLY — 6 items
CATH BALLN WEDGE 5F 110CM (CATHETERS) IMPLANT
GUIDEWIRE .025 260CM (WIRE) IMPLANT
PACK CARDIAC CATHETERIZATION (CUSTOM PROCEDURE TRAY) IMPLANT
SHEATH GLIDE SLENDER 4/5FR (SHEATH) IMPLANT
TRANSDUCER W/STOPCOCK (MISCELLANEOUS) IMPLANT
TUBING ART PRESS 72 MALE/FEM (TUBING) IMPLANT

## 2023-12-03 NOTE — Interval H&P Note (Signed)
 History and Physical Interval Note:  12/03/2023 6:37 AM  Cameron Patton  has presented today for surgery, with the diagnosis of asd.  The various methods of treatment have been discussed with the patient and family. After consideration of risks, benefits and other options for treatment, the patient has consented to  Procedure(s): RIGHT HEART CATH (N/A) as a surgical intervention.  The patient's history has been reviewed, patient examined, no change in status, stable for surgery.  I have reviewed the patient's chart and labs.  Questions were answered to the patient's satisfaction.     Zulema Pulask[place] K Monea Pesantez

## 2023-12-03 NOTE — Discharge Instructions (Addendum)
 Restart Coumadin  tonight at 21:00.    Brachial Site Care   This sheet gives you information about how to care for yourself after your procedure. Your health care provider may also give you more specific instructions. If you have problems or questions, contact your health care provider. What can I expect after the procedure? After the procedure, it is common to have: Bruising and tenderness at the catheter insertion area. Follow these instructions at home:  Insertion site care Follow instructions from your health care provider about how to take care of your insertion site. Make sure you: Wash your hands with soap and water before you change your bandage (dressing). If soap and water are not available, use hand sanitizer. Remove your dressing as told by your health care provider. In 24 hours Check your insertion site every day for signs of infection. Check for: Redness, swelling, or pain. Pus or a bad smell. Warmth. You may shower 24-48 hours after the procedure. Do not apply powder or lotion to the site.  Activity For 24 hours after the procedure, or as directed by your health care provider: Do not push or pull heavy objects with the affected arm. Do not drive yourself home from the hospital or clinic. You may drive 24 hours after the procedure unless your health care provider tells you not to. Do not lift anything that is heavier than 10 lb (4.5 kg), or the limit that you are told, until your health care provider says that it is safe.  For 24 hours

## 2023-12-04 LAB — POCT I-STAT EG7
Acid-Base Excess: 2 mmol/L (ref 0.0–2.0)
Bicarbonate: 28.1 mmol/L — ABNORMAL HIGH (ref 20.0–28.0)
Calcium, Ion: 1.18 mmol/L (ref 1.15–1.40)
HCT: 35 % — ABNORMAL LOW (ref 39.0–52.0)
Hemoglobin: 11.9 g/dL — ABNORMAL LOW (ref 13.0–17.0)
O2 Saturation: 68 %
Potassium: 4.2 mmol/L (ref 3.5–5.1)
Sodium: 138 mmol/L (ref 135–145)
TCO2: 30 mmol/L (ref 22–32)
pCO2, Ven: 46.9 mmHg (ref 44–60)
pH, Ven: 7.386 (ref 7.25–7.43)
pO2, Ven: 36 mmHg (ref 32–45)

## 2023-12-20 ENCOUNTER — Encounter (HOSPITAL_COMMUNITY): Payer: Self-pay

## 2023-12-26 ENCOUNTER — Ambulatory Visit: Attending: Cardiology | Admitting: *Deleted

## 2023-12-26 DIAGNOSIS — Z5181 Encounter for therapeutic drug level monitoring: Secondary | ICD-10-CM | POA: Diagnosis not present

## 2023-12-26 DIAGNOSIS — Z7901 Long term (current) use of anticoagulants: Secondary | ICD-10-CM

## 2023-12-26 LAB — POCT INR: POC INR: 2.7

## 2023-12-26 NOTE — Patient Instructions (Signed)
 Description   Continue taking warfarin 1 tablet daily except for 1/2 a tablet on Monday and Fridays. Recheck INR in 5 weeks.  Anticoagulation Clinic [phone]

## 2023-12-27 ENCOUNTER — Ambulatory Visit (INDEPENDENT_AMBULATORY_CARE_PROVIDER_SITE_OTHER): Admitting: Family Medicine

## 2023-12-27 ENCOUNTER — Encounter: Payer: Self-pay | Admitting: Family Medicine

## 2023-12-27 VITALS — BP 126/73 | HR 59 | Temp 98.2°F | Ht 69.0 in | Wt 145.4 lb

## 2023-12-27 DIAGNOSIS — F1010 Alcohol abuse, uncomplicated: Secondary | ICD-10-CM | POA: Diagnosis not present

## 2023-12-27 DIAGNOSIS — I4811 Longstanding persistent atrial fibrillation: Secondary | ICD-10-CM | POA: Diagnosis not present

## 2023-12-27 DIAGNOSIS — Z23 Encounter for immunization: Secondary | ICD-10-CM | POA: Diagnosis not present

## 2023-12-27 DIAGNOSIS — Z0001 Encounter for general adult medical examination with abnormal findings: Secondary | ICD-10-CM

## 2023-12-27 DIAGNOSIS — J309 Allergic rhinitis, unspecified: Secondary | ICD-10-CM

## 2023-12-27 DIAGNOSIS — Z131 Encounter for screening for diabetes mellitus: Secondary | ICD-10-CM | POA: Diagnosis not present

## 2023-12-27 DIAGNOSIS — Z13220 Encounter for screening for lipoid disorders: Secondary | ICD-10-CM

## 2023-12-27 LAB — LIPID PANEL
Cholesterol: 167 mg/dL (ref 0–200)
HDL: 70.9 mg/dL (ref >39.00)
LDL Cholesterol: 89 mg/dL (ref 0–99)
NonHDL: 95.9
Total CHOL/HDL Ratio: 2
Triglycerides: 37 mg/dL (ref 0.0–149.0)
VLDL: 7.4 mg/dL (ref 0.0–40.0)

## 2023-12-27 LAB — COMPREHENSIVE METABOLIC PANEL WITH GFR
ALT: 22 U/L (ref 0–53)
AST: 32 U/L (ref 0–37)
Albumin: 4.3 g/dL (ref 3.5–5.2)
Alkaline Phosphatase: 123 U/L — ABNORMAL HIGH (ref 39–117)
BUN: 23 mg/dL (ref 6–23)
CO2: 33 meq/L — ABNORMAL HIGH (ref 19–32)
Calcium: 9.4 mg/dL (ref 8.4–10.5)
Chloride: 99 meq/L (ref 96–112)
Creatinine, Ser: 0.84 mg/dL (ref 0.40–1.50)
GFR: 82.56 mL/min
Glucose, Bld: 84 mg/dL (ref 70–99)
Potassium: 4.4 meq/L (ref 3.5–5.1)
Sodium: 137 meq/L (ref 135–145)
Total Bilirubin: 1.1 mg/dL (ref 0.2–1.2)
Total Protein: 7.3 g/dL (ref 6.0–8.3)

## 2023-12-27 LAB — HEMOGLOBIN A1C: Hgb A1c MFr Bld: 5.2 % (ref 4.6–6.5)

## 2023-12-27 LAB — CBC
HCT: 36.7 % — ABNORMAL LOW (ref 39.0–52.0)
Hemoglobin: 12.2 g/dL — ABNORMAL LOW (ref 13.0–17.0)
MCHC: 33.3 g/dL (ref 30.0–36.0)
MCV: 93.5 fl (ref 78.0–100.0)
Platelets: 202 10*3/uL (ref 150.0–400.0)
RBC: 3.92 Mil/uL — ABNORMAL LOW (ref 4.22–5.81)
RDW: 13.5 % (ref 11.5–15.5)
WBC: 5.2 10*3/uL (ref 4.0–10.5)

## 2023-12-27 NOTE — Patient Instructions (Addendum)
 It was very nice to see you today!  I will refer you to see the allergist.  Will check blood work today.  Will get your pneumonia vaccine.  Let us  know if you change your mind about referral to see a counselor.  I will see you back in a year for your next physical.  Come back sooner if needed.  Return in about 1 year (around 12/26/2024) for Annual Physical.   Take care, Dr Daneil Dunker  PLEASE NOTE:  If you had any lab tests, please let us  know if you have not heard back within a few days. You may see your results on mychart before we have a chance to review them but we will give you a call once they are reviewed by us .   If we ordered any referrals today, please let us  know if you have not heard from their office within the next week.   If you had any urgent prescriptions sent in today, please check with the pharmacy within an hour of our visit to make sure the prescription was transmitted appropriately.   Please try these tips to maintain a healthy lifestyle:  Eat at least 3 REAL meals and 1-2 snacks per day.  Aim for no more than 5 hours between eating.  If you eat breakfast, please do so within one hour of getting up.   Each meal should contain half fruits/vegetables, one quarter protein, and one quarter carbs (no bigger than a computer mouse)  Cut down on sweet beverages. This includes juice, soda, and sweet tea.   Drink at least 1 glass of water with each meal and aim for at least 8 glasses per day  Exercise at least 150 minutes every week.    Preventive Care 25 Years and Older, Male Preventive care refers to lifestyle choices and visits with your health care provider that can promote health and wellness. Preventive care visits are also called wellness exams. What can I expect for my preventive care visit? Counseling During your preventive care visit, your health care provider may ask about your: Medical history, including: Past medical problems. Family medical  history. History of falls. Current health, including: Emotional well-being. Home life and relationship well-being. Sexual activity. Memory and ability to understand (cognition). Lifestyle, including: Alcohol, nicotine or tobacco, and drug use. Access to firearms. Diet, exercise, and sleep habits. Work and work Astronomer. Sunscreen use. Safety issues such as seatbelt and bike helmet use. Physical exam Your health care provider will check your: Height and weight. These may be used to calculate your BMI (body mass index). BMI is a measurement that tells if you are at a healthy weight. Waist circumference. This measures the distance around your waistline. This measurement also tells if you are at a healthy weight and may help predict your risk of certain diseases, such as type 2 diabetes and high blood pressure. Heart rate and blood pressure. Body temperature. Skin for abnormal spots. What immunizations do I need?  Vaccines are usually given at various ages, according to a schedule. Your health care provider will recommend vaccines for you based on your age, medical history, and lifestyle or other factors, such as travel or where you work. What tests do I need? Screening Your health care provider may recommend screening tests for certain conditions. This may include: Lipid and cholesterol levels. Diabetes screening. This is done by checking your blood sugar (glucose) after you have not eaten for a while (fasting). Hepatitis C test. Hepatitis B test. HIV (human  immunodeficiency virus) test. STI (sexually transmitted infection) testing, if you are at risk. Lung cancer screening. Colorectal cancer screening. Prostate cancer screening. Abdominal aortic aneurysm (AAA) screening. You may need this if you are a current or former smoker. Talk with your health care provider about your test results, treatment options, and if necessary, the need for more tests. Follow these instructions at  home: Eating and drinking  Eat a diet that includes fresh fruits and vegetables, whole grains, lean protein, and low-fat dairy products. Limit your intake of foods with high amounts of sugar, saturated fats, and salt. Take vitamin and mineral supplements as recommended by your health care provider. Do not drink alcohol if your health care provider tells you not to drink. If you drink alcohol: Limit how much you have to 0-2 drinks a day. Know how much alcohol is in your drink. In the [place] one drink equals one 12 oz bottle of beer (355 mL), one 5 oz glass of wine (148 mL), or one 1 oz glass of hard liquor (44 mL). Lifestyle Brush your teeth every morning and night with fluoride toothpaste. Floss one time each day. Exercise for at least 30 minutes 5 or more days each week. Do not use any products that contain nicotine or tobacco. These products include cigarettes, chewing tobacco, and vaping devices, such as e-cigarettes. If you need help quitting, ask your health care provider. Do not use drugs. If you are sexually active, practice safe sex. Use a condom or other form of protection to prevent STIs. Take aspirin  only as told by your health care provider. Make sure that you understand how much to take and what form to take. Work with your health care provider to find out whether it is safe and beneficial for you to take aspirin  daily. Ask your health care provider if you need to take a cholesterol-lowering medicine (statin). Find healthy ways to manage stress, such as: Meditation, yoga, or listening to music. Journaling. Talking to a trusted person. Spending time with friends and family. Safety Always wear your seat belt while driving or riding in a vehicle. Do not drive: If you have been drinking alcohol. Do not ride with someone who has been drinking. When you are tired or distracted. While texting. If you have been using any mind-altering substances or drugs. Wear a helmet and other  protective equipment during sports activities. If you have firearms in your house, make sure you follow all gun safety procedures. Minimize exposure to UV radiation to reduce your risk of skin cancer. What's next? Visit your health care provider once a year for an annual wellness visit. Ask your health care provider how often you should have your eyes and teeth checked. Stay up to date on all vaccines. This information is not intended to replace advice given to you by your health care provider. Make sure you discuss any questions you have with your health care provider. Document Revised: 01/25/2021 Document Reviewed: 01/25/2021 Elsevier Patient Education  2024 [employer].

## 2023-12-27 NOTE — Progress Notes (Signed)
 Chief Complaint:  Cameron Patton is a 78 y.o. male who presents today for his annual comprehensive physical exam.    Assessment/Plan:  Chronic Problems Addressed Today: Allergic rhinitis Not controlled on OTC medications. Will refer to allergist.   Atrial fibrillation [facility]) Follows with cardiology. He is on digoxin  and warfarin.   Alcohol abuse Encouraged cessation. We did discuss referral to mental health specialist however he declined. He will let us  know if he changes any his mind.   Preventative Healthcare: Check labs. No longer needs colonoscopy. He can get shingles vaccine and Tetanus, Diphtheria, and Pertussis (Tdap) at the pharmacy.  Pneumonia vaccine given today.   Patient Counseling(The following topics were reviewed and/or handout was given):  -Nutrition: Stressed importance of moderation in sodium/caffeine intake, saturated fat and cholesterol, caloric balance, sufficient intake of fresh fruits, vegetables, and fiber.  -Stressed the importance of regular exercise.   -Substance Abuse: Discussed cessation/primary prevention of tobacco, alcohol, or other drug use; driving or other dangerous activities under the influence; availability of treatment for abuse.   -Injury prevention: Discussed safety belts, safety helmets, smoke detector, smoking near bedding or upholstery.   -Sexuality: Discussed sexually transmitted diseases, partner selection, use of condoms, avoidance of unintended pregnancy and contraceptive alternatives.   -Dental health: Discussed importance of regular tooth brushing, flossing, and dental visits.  -Health maintenance and immunizations reviewed. Please refer to Health maintenance section.  Return to care in 1 year for next preventative visit.     Subjective:  HPI:  See assessment / plan for status of chronic conditions. Patient here today for his annual physical. He has been having more issues with allergies and would like to be referred to see an  allergist for this. HE is taking an OTC allergy medication for this with only modest improvement.   I last saw him about 2 years ago. Since our last visit he has been following with cardiology for mitral regurgitation and atrial fibrillation. He is doing well with his medications   Lifestyle Diet: Balanced. Trying to get plenty of protein.  Exercise: Walks frequently.      12/27/2023    1:17 PM  Depression screen PHQ 2/9  Decreased Interest 0  Down, Depressed, Hopeless 0  PHQ - 2 Score 0    Health Maintenance Due  Topic Date Due   DTaP/Tdap/Td (1 - Tdap) Never done   Zoster Vaccines- Shingrix (1 of 2) Never done   Pneumonia Vaccine 62+ Years old (2 of 2 - PPSV23) 07/13/2015   Medicare Annual Wellness (AWV)  11/03/2021    ROS: Per HPI, otherwise a complete review of systems was negative.   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 Lorie Rook and Dr. 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   Aortic atherosclerosis (HCC) 06/20/2022   Severe mitral regurgitation 06/20/2022   Dupuytren contracture 12/05/2021   Alcohol abuse 02/27/2018   Depression 02/27/2018   Long term (current) use of anticoagulants [Z79.01] 02/28/2016   Hx of degenerative disc disease 12/22/2015   Atrial fibrillation (HCC) 12/22/2015  Allergic rhinitis 12/22/2015   Benign prostatic hyperplasia 05/18/2015   History of atrial fibrillation 09/07/2013   Past Surgical History:  Procedure Laterality Date   ABDOMINAL AORTOGRAM N/A 07/18/2022   Procedure: ABDOMINAL AORTOGRAM;  Surgeon: Lucendia Rusk, MD;  Location:  [facility] INVASIVE CV LAB;  Service: Cardiovascular;  Laterality: N/A;   BUBBLE STUDY  05/30/2022   Procedure: BUBBLE STUDY;  Surgeon: Harrold Lincoln, MD;  Location: [facility] ENDOSCOPY;  Service: Cardiovascular;;   CATARACT EXTRACTION BILATERAL W/ ANTERIOR VITRECTOMY Bilateral 2015   INGUINAL HERNIA REPAIR Right 1982   RIGHT HEART CATH N/A 12/03/2023   Procedure: RIGHT HEART CATH;  Surgeon: Kyra Phy, MD;  Location: [facility] INVASIVE CV LAB;  Service: Cardiovascular;  Laterality: N/A;   RIGHT/LEFT HEART CATH AND CORONARY ANGIOGRAPHY N/A 07/18/2022   Procedure: RIGHT/LEFT HEART CATH AND CORONARY ANGIOGRAPHY;  Surgeon: Lucendia Rusk, MD;  Location: [facility] INVASIVE CV LAB;  Service: Cardiovascular;  Laterality: N/A;   TEE WITHOUT CARDIOVERSION N/A 05/30/2022   Procedure: TRANSESOPHAGEAL ECHOCARDIOGRAM (TEE);  Surgeon: Harrold Lincoln, MD;  Location: [facility] ENDOSCOPY;  Service: Cardiovascular;  Laterality: N/A;   TEE WITHOUT CARDIOVERSION N/A 10/25/2022   Procedure: TRANSESOPHAGEAL ECHOCARDIOGRAM;  Surgeon: Kyra Phy, MD;  Location: [facility] INVASIVE CV LAB;  Service: Cardiovascular;  Laterality: N/A;   TRANSCATHETER MITRAL EDGE TO EDGE REPAIR N/A 10/25/2022   Procedure: MITRAL VALVE REPAIR;  Surgeon: Kyra Phy, MD;  Location: [facility] INVASIVE CV LAB;  Service: Cardiovascular;  Laterality: N/A;    Family History  Problem Relation Age of Onset   Alcoholism Mother        Died age 61   Stroke Brother    Cancer Brother        Liver/lung    Medications- reviewed and updated Current Outpatient Medications  Medication Sig Dispense Refill   cyanocobalamin  (VITAMIN B12) 1000 MCG tablet Take 1,000 mcg by mouth daily.     digoxin  (LANOXIN ) 0.125 MG tablet Take 1 tablet (0.125 mg total) by mouth daily. 90 tablet 0   warfarin (COUMADIN ) 3 MG tablet Take 1-2 tablets Daily or as prescribed by Coumadin  clinic. 60 tablet 1   No current facility-administered medications for this visit.     Allergies-reviewed and updated No Known Allergies  Social History   Socioeconomic History   Marital status: Single    Spouse name: Not on file   Number of children: 2   Years of education: Not on file   Highest education level: Bachelor's degree (e.g., BA, AB, BS)  Occupational History   Occupation: Retired   Tobacco Use   Smoking status: Never   Smokeless tobacco: Never  Vaping Use   Vaping status: Never Used  Substance and Sexual Activity   Alcohol use: Yes    Alcohol/week: 13.0 standard drinks of alcohol    Types: 8 Cans of beer, 5 Shots of liquor per week    Comment: Daily (4-5 beers, 1-2 shots - 4-5 days a week)   Drug use: Never   Sexual activity: Yes    Partners: Female  Other Topics Concern   Not on file  Social History Narrative   ** Merged History Encounter **       Relocated to [place] from [place] 08/2015 months ago after significant hospital stay with UTI leading to sepsis. Went to rehab for a few months afterwards. Sister in law only family in [place]  Son lives in [place] Daughter lives in [place]     (addiction counselor)  Social Drivers of Corporate investment banker Strain: Low Risk  (12/23/2023)   Overall Financial Resource Strain (CARDIA)    Difficulty of Paying Living Expenses: Not very hard  Food Insecurity: No Food Insecurity (12/23/2023)   Hunger Vital Sign    Worried About Running Out of Food in the Last Year: Never true    Ran Out of Food in the Last Year: Never true  Transportation Needs: No Transportation Needs (12/23/2023)   PRAPARE - Administrator, Civil Service (Medical): No    Lack of Transportation (Non-Medical): No  Physical Activity: Insufficiently Active (12/23/2023)   Exercise Vital Sign    Days of Exercise per Week: 2 days    Minutes of Exercise per Session: 40 min  Stress: No Stress Concern Present (12/23/2023)   [employer] of Occupational Health - Occupational Stress Questionnaire    Feeling  of Stress : Only a little  Social Connections: Socially Isolated (12/23/2023)   Social Connection and Isolation Panel [NHANES]    Frequency of Communication with Friends and Family: Once a week    Frequency of Social Gatherings with Friends and Family: Once a week    Attends Religious Services: Never    Database administrator or Organizations: No    Attends Engineer, structural: Not on file    Marital Status: Divorced        Objective:  Physical Exam: BP 126/73   Pulse (!) 59   Temp 98.2 F (36.8 C) (Temporal)   Ht 5\' 9"  (1.753 m)   Wt 145 lb 6.4 oz (66 kg)   SpO2 97%   BMI 21.47 kg/m   Body mass index is 21.47 kg/m. Wt Readings from Last 3 Encounters:  12/27/23 145 lb 6.4 oz (66 kg)  12/03/23 146 lb (66.2 kg)  11/20/23 142 lb (64.4 kg)   Gen: NAD, resting comfortably HEENT: TMs normal bilaterally. OP clear. No thyromegaly noted.  CV: Irregular with no murmurs appreciated Pulm: NWOB, CTAB with no crackles, wheezes, or rhonchi GI: Normal bowel sounds present. Soft, Nontender, Nondistended. MSK: no edema, cyanosis, or clubbing noted Skin: warm, dry Neuro: CN2-12 grossly intact. Strength 5/5 in upper and lower extremities. Reflexes symmetric and intact bilaterally.  Psych: Normal affect and thought content     Paola Aleshire M. Daneil Dunker, MD 12/27/2023 1:47 PM

## 2023-12-27 NOTE — Assessment & Plan Note (Signed)
 Follows with cardiology. He is on digoxin  and warfarin.

## 2023-12-27 NOTE — Addendum Note (Signed)
 Addended by: Winona Haw on: 12/27/2023 01:57 PM   Modules accepted: Orders

## 2023-12-27 NOTE — Assessment & Plan Note (Signed)
 Not controlled on OTC medications. Will refer to allergist.

## 2023-12-27 NOTE — Assessment & Plan Note (Signed)
 Encouraged cessation. We did discuss referral to mental health specialist however he declined. He will let us  know if he changes any his mind.

## 2023-12-31 LAB — TSH: TSH: 2.17 u[IU]/mL (ref 0.35–5.50)

## 2023-12-31 LAB — VITAMIN B1: Vitamin B1 (Thiamine): 10 nmol/L (ref 8–30)

## 2024-01-01 ENCOUNTER — Ambulatory Visit: Payer: Self-pay | Admitting: Family Medicine

## 2024-01-01 NOTE — Progress Notes (Signed)
 His labs show that he is mildly anemic but stable compared to his baseline.  The rest of his labs are all stable.  Do not need to make any changes to his treatment plan at this time.  He should continue to work on diet and exercise and we can recheck everything in a year.

## 2024-01-30 ENCOUNTER — Ambulatory Visit: Attending: Cardiology

## 2024-01-30 DIAGNOSIS — Z7901 Long term (current) use of anticoagulants: Secondary | ICD-10-CM

## 2024-01-30 LAB — POCT INR: INR: 2.7 (ref 2.0–3.0)

## 2024-01-30 NOTE — Patient Instructions (Signed)
 Description   Continue taking warfarin 1 tablet daily except for 1/2 a tablet on Monday and Fridays. Recheck INR in 6 weeks.  Anticoagulation Clinic [phone]

## 2024-01-30 NOTE — Progress Notes (Signed)
Please see anticoagulation encounter.

## 2024-02-05 ENCOUNTER — Other Ambulatory Visit: Payer: Self-pay | Admitting: Cardiology

## 2024-02-06 ENCOUNTER — Other Ambulatory Visit: Payer: Self-pay

## 2024-02-06 ENCOUNTER — Ambulatory Visit: Payer: Self-pay | Admitting: Allergy

## 2024-02-06 ENCOUNTER — Encounter: Payer: Self-pay | Admitting: Allergy

## 2024-02-06 VITALS — BP 110/70 | HR 72 | Temp 98.1°F | Ht 66.54 in | Wt 141.0 lb

## 2024-02-06 DIAGNOSIS — J3089 Other allergic rhinitis: Secondary | ICD-10-CM | POA: Diagnosis not present

## 2024-02-06 MED ORDER — AZELASTINE HCL 0.1 % NA SOLN: NASAL | 5 refills | Status: DC

## 2024-02-06 NOTE — Progress Notes (Signed)
 New Patient Note  RE: Cameron Patton MRN: [id number] DOB: 05-01-1946 Date of Office Visit: 02/06/2024  Primary care provider: Kennyth Worth HERO, MD  Chief Complaint: allergies  History of present illness: Cameron Patton is a 78 y.o. male presenting today for evaluation of allergic rhinitis.   Discussed the use of AI scribe software for clinical note transcription with the patient, who gave verbal consent to proceed.  He has a history of allergies, specifically to [place]  bluegrass, diagnosed through a blood test conducted in [place] prior to 2017. He was prescribed medication for this allergy but discontinued it due to medication fatigue, as he is already on a blood thinner and a beta blocker for atrial fibrillation. Despite stopping the allergy medication, his symptoms persisted and have become year-round, including nasal congestion, throat congestion, and ear canal fluid buildup.  He describes his living situation in an older apartment building, built in 1968, which may have mold issues as suggested by his daughter. He has noticed peeling paint and discolored drywall in the bathroom, which he suspects could be due to mold. Additionally, he reports a bug problem in his apartment, with bugs seen on the kitchen counter and in the dishwasher, despite monthly pest control visits.  His current symptoms include persistent sniffles, nasal and throat congestion, and ear discomfort, which he experiences throughout the year, including in the winter when grass pollen is not present. He has not been using any antihistamines regularly, though he took an allergy pill one or two days ago. He has a container of over-the-counter nose spray that he has not used that he thinks he has had for 2 to 3 years.    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 Wendel and Dr. Wonda    Past surgical history: Past Surgical History:  Procedure Laterality Date   ABDOMINAL AORTOGRAM N/A 07/18/2022   Procedure: ABDOMINAL AORTOGRAM;  Surgeon: Dann Candyce RAMAN, MD;  Location: [facility] INVASIVE CV LAB;  Service: Cardiovascular;  Laterality: N/A;   BUBBLE STUDY  05/30/2022   Procedure: BUBBLE STUDY;  Surgeon: Barbaraann Darryle Ned, MD;  Location: [facility] ENDOSCOPY;  Service: Cardiovascular;;   CATARACT EXTRACTION BILATERAL W/ ANTERIOR VITRECTOMY Bilateral 2015   INGUINAL HERNIA REPAIR Right 1982   RIGHT HEART CATH N/A 12/03/2023   Procedure: RIGHT HEART CATH;  Surgeon: Wendel Lurena POUR, MD;  Location: [facility] INVASIVE CV LAB;  Service: Cardiovascular;  Laterality: N/A;   RIGHT/LEFT HEART CATH AND CORONARY ANGIOGRAPHY N/A 07/18/2022   Procedure: RIGHT/LEFT HEART CATH AND CORONARY ANGIOGRAPHY;  Surgeon: Dann Candyce RAMAN, MD;  Location: [facility] INVASIVE CV LAB;  Service: Cardiovascular;  Laterality: N/A;   TEE WITHOUT CARDIOVERSION N/A 05/30/2022   Procedure: TRANSESOPHAGEAL ECHOCARDIOGRAM (TEE);  Surgeon: Barbaraann Darryle Ned, MD;  Location: [facility] ENDOSCOPY;  Service: Cardiovascular;  Laterality: N/A;   TEE WITHOUT CARDIOVERSION N/A 10/25/2022   Procedure: TRANSESOPHAGEAL ECHOCARDIOGRAM;  Surgeon: Wendel Lurena POUR, MD;  Location: [facility] INVASIVE CV LAB;  Service: Cardiovascular;  Laterality: N/A;   TRANSCATHETER MITRAL EDGE TO EDGE REPAIR N/A 10/25/2022   Procedure: MITRAL VALVE REPAIR;  Surgeon: Wendel Lurena POUR, MD;  Location:  [facility] INVASIVE CV LAB;  Service: Cardiovascular;  Laterality: N/A;    Family history:  Family History  Problem Relation Age of Onset   Alcoholism Mother        Died age 70   Stroke Brother    Cancer Brother        Liver/lung    Social history: Lives in a an apartment with carpeting with  electric heating and central cooling.  Dogs outside the home.  There is concern for mold and bugs in the home.  He is retired.  Denies smoking history.   Medication List: Current Outpatient Medications  Medication Sig Dispense Refill   cyanocobalamin  (VITAMIN B12) 1000 MCG tablet Take 1,000 mcg by mouth daily.     digoxin  (LANOXIN ) 0.125 MG tablet Take 1 tablet (0.125 mg total) by mouth daily. 90 tablet 2   warfarin (COUMADIN ) 3 MG tablet Take 1-2 tablets Daily or as prescribed by Coumadin  clinic. 60 tablet 1   No current facility-administered medications for this visit.    Known medication allergies: No Known Allergies   Physical examination: Blood pressure 110/70, pulse 72, temperature 98.1 F (36.7 C), temperature source Temporal, height 5' 6.54 (1.69 m), weight 141 lb (64 kg), SpO2 99%.  General: Alert, interactive, in no acute distress. HEENT: PERRLA, TMs pearly gray, turbinates minimally edematous without discharge, post-pharynx non erythematous. Neck: Supple without lymphadenopathy. Lungs: Clear to auscultation without wheezing, rhonchi or rales. {no increased work of breathing. CV: Normal S1, S2 without murmurs. Abdomen: Nondistended, nontender. Skin: Warm and dry, without lesions or rashes. Extremities:  No clubbing, cyanosis or edema. Neuro:   Grossly intact.  Diagnostics/Labs: None today  Assessment and plan:   Allergic rhinitis Chronic nasal and throat congestion likely due to multiple allergens, including possible indoor allergens like mold and cockroaches. Skin testing preferred for updated assessment. - Schedule skin testing for updated allergy assessment.  Hold any antihistamines (like Zyrtec, Allegra, Claritin, Xyzal , Benadryl, cough/cold medications) for 3 days prior to skin testing visit.  - Use nasal spray Azelastine 1-2 sprays each nostril up to twice a day as needed for nasal drainage, post-nasal drip.   With using nasal sprays point tip of bottle toward  eye on same side nostril and lean head slightly forward for best technique.   - Will see the response to the nasal spray as trying to minimize how much medication is used for management  Schedule skin testing visit and hold antihistamines for 3 days prior to testing.  (Environment 1-55) Routine follow-up in 4 to 6 months or sooner if needed.  I appreciate the opportunity to take part in Cameron Patton's care. Please do not hesitate to contact me with questions.  Sincerely,   Danita Brain, MD Allergy/Immunology [facility]

## 2024-02-06 NOTE — Patient Instructions (Signed)
 Allergic rhinitis Chronic nasal and throat congestion likely due to multiple allergens, including possible indoor allergens like mold and cockroaches. Skin testing preferred for updated assessment. - Schedule skin testing for updated allergy assessment.  Hold any antihistamines (like Zyrtec, Allegra, Claritin, Xyzal , Benadryl, cough/cold medications) for 3 days prior to skin testing visit.  - Use nasal spray Azelastine 1-2 sprays each nostril up to twice a day as needed for nasal drainage, post-nasal drip.   With using nasal sprays point tip of bottle toward eye on same side nostril and lean head slightly forward for best technique.    Schedule skin testing visit and hold antihistamines for 3 days prior to testing.

## 2024-03-04 ENCOUNTER — Ambulatory Visit: Admitting: Allergy

## 2024-03-04 DIAGNOSIS — M25511 Pain in right shoulder: Secondary | ICD-10-CM | POA: Diagnosis not present

## 2024-03-04 DIAGNOSIS — S42291A Other displaced fracture of upper end of right humerus, initial encounter for closed fracture: Secondary | ICD-10-CM | POA: Diagnosis not present

## 2024-03-04 DIAGNOSIS — M25421 Effusion, right elbow: Secondary | ICD-10-CM | POA: Diagnosis not present

## 2024-03-04 DIAGNOSIS — Z6821 Body mass index (BMI) 21.0-21.9, adult: Secondary | ICD-10-CM | POA: Diagnosis not present

## 2024-03-04 DIAGNOSIS — M25521 Pain in right elbow: Secondary | ICD-10-CM | POA: Diagnosis not present

## 2024-03-09 ENCOUNTER — Ambulatory Visit: Payer: Self-pay

## 2024-03-09 NOTE — Telephone Encounter (Signed)
 FYI Only or Action Required?: Action required by provider: referral request and request for documentation or forms. Ortho Patient was last seen in primary care on 12/27/2023 by Cameron Worth HERO, MD.  Called Nurse Triage reporting broken arm.  Symptoms began a week ago.  Interventions attempted: Nothing.  Symptoms are: unchanged.  Triage Disposition: See PCP When Office is Open (Within 3 Days)  Patient/caregiver understands and will follow disposition?: No, wishes to speak with PCP         Copied from CRM #[id number]. Topic: Clinical - Red Word Triage >> Mar 09, 2024 10:00 AM Cameron Patton wrote: Red Word that prompted transfer to Nurse Triage: Patient was on vacation last week and suffered a fall while walking his dog, he was in a medical facility in [facility] who diagnosed his with a broken right arm. Reason for Disposition  [1] After 3 days AND [2] pain not improved  Answer Assessment - Initial Assessment Questions 1. MECHANISM: How did the injury happen?     Fall and broke arm while walking dog, fell in street  2. ONSET: When did the injury happen? (e.g., minutes, hours ago)      02/20/24 3. LOCATION: Where is the injury located? Which arm?     Right arm 4. APPEARANCE of INJURY: What does the injury look like?      Dx as broken 5. SEVERITY: Can you use the arm normally?      Was seen by ortho in [facility], and states humerus is cracked in two spots. 6. SWELLING or BRUISING: is there any swelling or bruising? If Yes, ask: How large is it? (e.g., inches, centimeters)      Swelling around elbow 7. PAIN: Is there pain? If Yes, ask: How bad is the pain? (Scale 0-10; or none, mild, moderate, severe)     Arm is in a sling and keeps it still has oxycodone  for pain 8. TETANUS: For any breaks in the skin, ask: When was your last tetanus booster?     na 9. OTHER SYMPTOMS: Do you have any other symptoms?  (e.g., numbness in hand)     denies 10. PREGNANCY: Is  there any chance you are pregnant? When was your last menstrual period?       na  Protocols used: Arm Injury-A-AH

## 2024-03-10 NOTE — Telephone Encounter (Signed)
 Patient need office visit for referral, please schedule an office visit with Dr Kennyth

## 2024-03-11 ENCOUNTER — Telehealth: Payer: Self-pay | Admitting: Family Medicine

## 2024-03-11 NOTE — Telephone Encounter (Unsigned)
 Copied from CRM [id number]. Topic: Referral - Question >> Mar 11, 2024 10:22 AM Aleatha BROCKS wrote: Reason for CRM: Patient wanted to know why is it necessary for him to make a appointment with Dr. Kennyth to get his referral for a [facility] doctor, he has a cd of exam that was done previously with xrays and would like it to be referred to a [place] Doctor on bus [place]

## 2024-03-11 NOTE — Telephone Encounter (Signed)
 Patient need ov for referral

## 2024-03-12 ENCOUNTER — Ambulatory Visit

## 2024-03-17 ENCOUNTER — Ambulatory Visit: Attending: Cardiovascular Disease

## 2024-03-17 DIAGNOSIS — Z7901 Long term (current) use of anticoagulants: Secondary | ICD-10-CM

## 2024-03-17 LAB — POCT INR: INR: 3.1 — AB (ref 2.0–3.0)

## 2024-03-17 NOTE — Patient Instructions (Signed)
 Continue taking warfarin 1 tablet daily except for 1/2 a tablet on Monday and Fridays.  Eat greens tonight.  Recheck INR in 6 weeks.  Anticoagulation Clinic [phone]

## 2024-03-17 NOTE — Progress Notes (Signed)
 INR-3.1; Please see anticoagulation encounter.

## 2024-03-18 ENCOUNTER — Other Ambulatory Visit: Payer: Self-pay | Admitting: Family Medicine

## 2024-03-18 DIAGNOSIS — G8929 Other chronic pain: Secondary | ICD-10-CM

## 2024-03-18 DIAGNOSIS — S42231A 3-part fracture of surgical neck of right humerus, initial encounter for closed fracture: Secondary | ICD-10-CM | POA: Diagnosis not present

## 2024-03-19 ENCOUNTER — Ambulatory Visit: Admitting: Allergy

## 2024-03-24 ENCOUNTER — Ambulatory Visit: Admitting: Family Medicine

## 2024-03-24 ENCOUNTER — Ambulatory Visit
Admission: RE | Admit: 2024-03-24 | Discharge: 2024-03-24 | Disposition: A | Discharge status: Home / Self Care | Source: Ambulatory Visit | Attending: Family Medicine | Admitting: Family Medicine

## 2024-03-24 DIAGNOSIS — S42261A Displaced fracture of lesser tuberosity of right humerus, initial encounter for closed fracture: Secondary | ICD-10-CM | POA: Diagnosis not present

## 2024-03-24 DIAGNOSIS — S42251A Displaced fracture of greater tuberosity of right humerus, initial encounter for closed fracture: Secondary | ICD-10-CM | POA: Diagnosis not present

## 2024-03-24 DIAGNOSIS — G8929 Other chronic pain: Secondary | ICD-10-CM

## 2024-04-01 DIAGNOSIS — S42231D 3-part fracture of surgical neck of right humerus, subsequent encounter for fracture with routine healing: Secondary | ICD-10-CM | POA: Diagnosis not present

## 2024-04-06 DIAGNOSIS — S42201G Unspecified fracture of upper end of right humerus, subsequent encounter for fracture with delayed healing: Secondary | ICD-10-CM | POA: Diagnosis not present

## 2024-04-10 DIAGNOSIS — S42201G Unspecified fracture of upper end of right humerus, subsequent encounter for fracture with delayed healing: Secondary | ICD-10-CM | POA: Diagnosis not present

## 2024-04-15 DIAGNOSIS — S42201G Unspecified fracture of upper end of right humerus, subsequent encounter for fracture with delayed healing: Secondary | ICD-10-CM | POA: Diagnosis not present

## 2024-04-20 DIAGNOSIS — S42201G Unspecified fracture of upper end of right humerus, subsequent encounter for fracture with delayed healing: Secondary | ICD-10-CM | POA: Diagnosis not present

## 2024-04-24 DIAGNOSIS — S42201G Unspecified fracture of upper end of right humerus, subsequent encounter for fracture with delayed healing: Secondary | ICD-10-CM | POA: Diagnosis not present

## 2024-04-27 DIAGNOSIS — S42201G Unspecified fracture of upper end of right humerus, subsequent encounter for fracture with delayed healing: Secondary | ICD-10-CM | POA: Diagnosis not present

## 2024-04-28 ENCOUNTER — Ambulatory Visit: Attending: Cardiology | Admitting: *Deleted

## 2024-04-28 DIAGNOSIS — I4811 Longstanding persistent atrial fibrillation: Secondary | ICD-10-CM | POA: Diagnosis not present

## 2024-04-28 DIAGNOSIS — Z7901 Long term (current) use of anticoagulants: Secondary | ICD-10-CM

## 2024-04-28 LAB — POCT INR: INR: 2.1 (ref 2.0–3.0)

## 2024-04-28 NOTE — Progress Notes (Signed)
 Description   INR-2.1; Continue taking warfarin 1 tablet daily except for 1/2 a tablet on Monday and Fridays. Recheck INR in 6 weeks.  Anticoagulation Clinic [phone]

## 2024-04-28 NOTE — Patient Instructions (Signed)
 Description   INR-2.1; Continue taking warfarin 1 tablet daily except for 1/2 a tablet on Monday and Fridays. Recheck INR in 6 weeks.  Anticoagulation Clinic [phone]

## 2024-05-01 ENCOUNTER — Other Ambulatory Visit: Payer: Self-pay | Admitting: Cardiology

## 2024-05-04 DIAGNOSIS — S42201G Unspecified fracture of upper end of right humerus, subsequent encounter for fracture with delayed healing: Secondary | ICD-10-CM | POA: Diagnosis not present

## 2024-05-08 DIAGNOSIS — S42201G Unspecified fracture of upper end of right humerus, subsequent encounter for fracture with delayed healing: Secondary | ICD-10-CM | POA: Diagnosis not present

## 2024-05-13 DIAGNOSIS — S42231D 3-part fracture of surgical neck of right humerus, subsequent encounter for fracture with routine healing: Secondary | ICD-10-CM | POA: Diagnosis not present

## 2024-06-03 ENCOUNTER — Ambulatory Visit

## 2024-06-03 VITALS — Ht 69.0 in | Wt 145.0 lb

## 2024-06-03 DIAGNOSIS — Z0000 Encounter for general adult medical examination without abnormal findings: Secondary | ICD-10-CM | POA: Diagnosis not present

## 2024-06-03 NOTE — Patient Instructions (Signed)
 Cameron Patton,  Thank you for taking the time for your Medicare Wellness Visit. I appreciate your continued commitment to your health goals. Please review the care plan we discussed, and feel free to reach out if I can assist you further.  Medicare recommends these wellness visits once per year to help you and your care team stay ahead of potential health issues. These visits are designed to focus on prevention, allowing your provider to concentrate on managing your acute and chronic conditions during your regular appointments.  Please note that Annual Wellness Visits do not include a physical exam. Some assessments may be limited, especially if the visit was conducted virtually. If needed, we may recommend a separate in-person follow-up with your provider.  Ongoing Care Seeing your primary care provider every 3 to 6 months helps us  monitor your health and provide consistent, personalized care.   Referrals If a referral was made during today's visit and you haven't received any updates within two weeks, please contact the referred provider directly to check on the status.  Recommended Screenings:  Health Maintenance  Topic Date Due   DTaP/Tdap/Td vaccine (1 - Tdap) Never done   Zoster (Shingles) Vaccine (1 of 2) Never done   Medicare Annual Wellness Visit  11/03/2021   Flu Shot  03/13/2024   COVID-19 Vaccine (5 - 2025-26 season) 04/13/2024   Pneumococcal Vaccine for age over 75  Completed   Meningitis B Vaccine  Aged Out       12/03/2023    6:34 AM  Advanced Directives  Does Patient Have a Medical Advance Directive? Yes  Type of Advance Directive Healthcare Power of Attorney  Does patient want to make changes to medical advance directive? No - Patient declined  Copy of Healthcare Power of Attorney in Chart? No - copy requested   Advance Care Planning is important because it: Ensures you receive medical care that aligns with your values, goals, and preferences. Provides guidance to  your family and loved ones, reducing the emotional burden of decision-making during critical moments.  Vision: Annual vision screenings are recommended for early detection of glaucoma, cataracts, and diabetic retinopathy. These exams can also reveal signs of chronic conditions such as diabetes and high blood pressure.  Dental: Annual dental screenings help detect early signs of oral cancer, gum disease, and other conditions linked to overall health, including heart disease and diabetes.  Please see the attached documents for additional preventive care recommendations.

## 2024-06-03 NOTE — Progress Notes (Signed)
 Subjective:   Cameron Patton is a 78 y.o. who presents for a Medicare Wellness preventive visit.  As a reminder, Annual Wellness Visits don't include a physical exam, and some assessments may be limited, especially if this visit is performed virtually. We may recommend an in-person follow-up visit with your provider if needed.  Visit Complete: Virtual I connected with  Cameron Patton Flies on 06/03/24 by a audio enabled telemedicine application and verified that I am speaking with the correct person using two identifiers.  Patient Location: Home  Provider Location: Home Office  I discussed the limitations of evaluation and management by telemedicine. The patient expressed understanding and agreed to proceed.  Vital Signs: Because this visit was a virtual/telehealth visit, some criteria may be missing or patient reported. Any vitals not documented were not able to be obtained and vitals that have been documented are patient reported.  VideoDeclined- This patient declined Librarian, academic. Therefore the visit was completed with audio only.  Persons Participating in Visit: Patient.  AWV Questionnaire: No: Patient Medicare AWV questionnaire was not completed prior to this visit.  Cardiac Risk Factors include: advanced age (>64men, >71 women);male gender     Objective:    Today's Vitals   06/03/24 1338  Weight: 145 lb (65.8 kg)  Height: 5' 9 (1.753 m)   Body mass index is 21.41 kg/m.     06/03/2024    1:46 PM 12/03/2023    6:34 AM 10/26/2022    2:24 PM 07/18/2022    8:50 AM 05/30/2022    7:32 AM 11/03/2020    1:16 PM 05/31/2020    1:00 PM  Advanced Directives  Does Patient Have a Medical Advance Directive? No Yes Yes Yes Yes Yes No  Type of Forensic scientist Power of [employer] Power of Attorney Healthcare Power of Attorney   Does patient want to make changes to medical  advance directive?  No - Patient declined No - Patient declined      Copy of Healthcare Power of Attorney in Chart?  No - copy requested No - copy requested  No - copy requested No - copy requested   Would patient like information on creating a medical advance directive? No - Patient declined  No - Patient declined    No - Patient declined    Current Medications (verified) Outpatient Encounter Medications as of 06/03/2024  Medication Sig   cyanocobalamin  (VITAMIN B12) 1000 MCG tablet Take 1,000 mcg by mouth daily.   digoxin  (LANOXIN ) 0.125 MG tablet Take 1 tablet (0.125 mg total) by mouth daily.   warfarin (COUMADIN ) 3 MG tablet Take 1/2-1 tablet Daily or as prescribed by Coumadin  clinic.   [DISCONTINUED] azelastine  (ASTELIN ) 0.1 % nasal spray 1-2 sprays each nostril up to twice a day as needed for nasal drainage, post-nasal drip.   No facility-administered encounter medications on file as of 06/03/2024.    Allergies (verified) Patient has no known allergies.   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 Wendel and Dr. Wonda   Past Surgical History:  Procedure Laterality Date   ABDOMINAL AORTOGRAM N/A 07/18/2022   Procedure: ABDOMINAL AORTOGRAM;  Surgeon: Dann Candyce RAMAN, MD;  Location: [facility] INVASIVE CV LAB;  Service: Cardiovascular;  Laterality: N/A;   BUBBLE STUDY  05/30/2022   Procedure: BUBBLE STUDY;  Surgeon: Barbaraann Darryle Ned, MD;  Location: [facility] ENDOSCOPY;  Service: Cardiovascular;;   CATARACT EXTRACTION BILATERAL W/ ANTERIOR VITRECTOMY Bilateral 2015   INGUINAL HERNIA REPAIR Right 1982   RIGHT HEART CATH N/A 12/03/2023   Procedure: RIGHT HEART CATH;  Surgeon: Wendel Lurena POUR, MD;  Location: [facility] INVASIVE CV LAB;  Service: Cardiovascular;   Laterality: N/A;   RIGHT/LEFT HEART CATH AND CORONARY ANGIOGRAPHY N/A 07/18/2022   Procedure: RIGHT/LEFT HEART CATH AND CORONARY ANGIOGRAPHY;  Surgeon: Dann Candyce RAMAN, MD;  Location: [facility] INVASIVE CV LAB;  Service: Cardiovascular;  Laterality: N/A;   TEE WITHOUT CARDIOVERSION N/A 05/30/2022   Procedure: TRANSESOPHAGEAL ECHOCARDIOGRAM (TEE);  Surgeon: Barbaraann Darryle Ned, MD;  Location: [facility] ENDOSCOPY;  Service: Cardiovascular;  Laterality: N/A;   TEE WITHOUT CARDIOVERSION N/A 10/25/2022   Procedure: TRANSESOPHAGEAL ECHOCARDIOGRAM;  Surgeon: Wendel Lurena POUR, MD;  Location: [facility] INVASIVE CV LAB;  Service: Cardiovascular;  Laterality: N/A;   TRANSCATHETER MITRAL EDGE TO EDGE REPAIR N/A 10/25/2022   Procedure: MITRAL VALVE REPAIR;  Surgeon: Wendel Lurena POUR, MD;  Location: [facility] INVASIVE CV LAB;  Service: Cardiovascular;  Laterality: N/A;   Family History  Problem Relation Age of Onset   Alcoholism Mother        Died age 46   Stroke Brother    Cancer Brother        Liver/lung   Social History   Socioeconomic History   Marital status: Divorced    Spouse name: Not on file   Number of children: 2   Years of education: Not on file   Highest education level: Bachelor's degree (e.g., BA, AB, BS)  Occupational History   Occupation: Retired   Tobacco Use   Smoking status: Never    Passive exposure: Past   Smokeless tobacco: Never  Vaping Use   Vaping status: Never Used  Substance and Sexual Activity   Alcohol use: Yes    Alcohol/week: 13.0 standard drinks of alcohol    Types: 8 Cans of beer, 5 Shots of liquor per week    Comment: Daily (4-5 beers, 1-2 shots - 4-5 days a week)   Drug use: Never   Sexual activity: Yes    Partners: Female  Other Topics Concern   Not on file  Social History Narrative   ** Merged History Encounter **       Relocated to [place] from [place] 08/2015 months ago after significant hospital stay with UTI leading to sepsis. Went to rehab for a few months  afterwards. Sister in law only family in [place]  Son lives in [place] Daughter lives in [place]     (addiction counselor)    Social Drivers of Health   Financial Resource Strain: Low Risk  (06/03/2024)   Overall Financial Resource Strain (CARDIA)    Difficulty of Paying Living Expenses: Not hard at all  Food Insecurity: No Food Insecurity (06/03/2024)   Hunger Vital Sign    Worried About Running Out of Food in the Last Year: Never true    Ran Out of Food in the Last Year: Never true  Transportation Needs: No Transportation Needs (06/03/2024)   PRAPARE - Administrator, Civil Service (Medical): No    Lack of Transportation (Non-Medical): No  Physical Activity: Insufficiently Active (06/03/2024)   Exercise Vital Sign    Days of Exercise per Week:  2 days    Minutes of Exercise per Session: 40 min  Stress: No Stress Concern Present (06/03/2024)   [employer] of Occupational Health - Occupational Stress Questionnaire    Feeling of Stress: Not at all  Social Connections: Socially Isolated (06/03/2024)   Social Connection and Isolation Panel    Frequency of Communication with Friends and Family: Once a week    Frequency of Social Gatherings with Friends and Family: Once a week    Attends Religious Services: Never    Database administrator or Organizations: No    Attends Engineer, structural: Never    Marital Status: Divorced    Tobacco Counseling Counseling given: Not Answered    Clinical Intake:  Pre-visit preparation completed: Yes  Pain : No/denies pain     BMI - recorded: 21.41 Nutritional Status: BMI of 19-24  Normal Nutritional Risks: None Diabetes: No  Lab Results  Component Value Date   HGBA1C 5.2 12/27/2023   HGBA1C 5.6 12/05/2021     How often do you need to have someone help you when you read instructions, pamphlets, or other written materials from your doctor or pharmacy?: 1 - Never  Interpreter Needed?:  No  Information entered by :: Ellouise Haws, LPN   Activities of Daily Living     06/03/2024    1:40 PM  In your present state of health, do you have any difficulty performing the following activities:  Hearing? 0  Vision? 0  Difficulty concentrating or making decisions? 0  Walking or climbing stairs? 0  Dressing or bathing? 0  Doing errands, shopping? 0  Preparing Food and eating ? N  Using the Toilet? N  In the past six months, have you accidently leaked urine? N  Do you have problems with loss of bowel control? N  Managing your Medications? N  Managing your Finances? N  Housekeeping or managing your Housekeeping? N    Patient Care Team: Kennyth Worth HERO, MD as PCP - General (Family Medicine) Lavona Agent, MD as PCP - Cardiology (Cardiology) Thukkani, Arun K, MD as PCP - Structural Heart Kennyth Worth HERO, MD (Family Medicine)  I have updated your Care Teams any recent Medical Services you may have received from other providers in the past year.     Assessment:   This is a routine wellness examination for Sturgeon.  Hearing/Vision screen Hearing Screening - Comments:: Pt denies hearing issues at this time  Vision Screening - Comments:: Encouraged to follow up with eye provider    Goals Addressed             This Visit's Progress    Patient Stated       Continue exercise       Depression Screen     06/03/2024    1:48 PM 12/27/2023    1:17 PM 12/05/2021    2:03 PM 11/03/2020    1:08 PM 09/29/2019   10:29 AM 12/22/2015    3:09 PM  PHQ 2/9 Scores  PHQ - 2 Score 0 0 1 1 5  0  PHQ- 9 Score   2  22     Fall Risk     06/03/2024    1:50 PM 12/27/2023    1:17 PM 12/05/2021    1:21 PM 11/03/2020    1:20 PM 09/29/2019    4:15 PM  Fall Risk   Falls in the past year? 1 0 0 1 0   Number falls in past yr: 1 0 0  1 0  Injury with Fall? 1 0 0 1 0  Comment pulled by dog broke arm   cracked 4 ribs   Risk for fall due to : History of fall(s) No Fall Risks No Fall  Risks History of fall(s);Impaired vision;Impaired balance/gait Other (Comment)  Risk for fall due to: Comment     Alcohol use  Follow up Falls prevention discussed   Falls prevention discussed  Education provided;Falls prevention discussed;Falls evaluation completed      Data saved with a previous flowsheet row definition    MEDICARE RISK AT HOME:  Medicare Risk at Home Any stairs in or around the home?: Yes If so, are there any without handrails?: No Home free of loose throw rugs in walkways, pet beds, electrical cords, etc?: Yes Adequate lighting in your home to reduce risk of falls?: Yes Life alert?: No Use of a cane, walker or w/c?: No Grab bars in the bathroom?: No Shower chair or bench in shower?: No Elevated toilet seat or a handicapped toilet?: No  TIMED UP AND GO:  Was the test performed?  No  Cognitive Function: 6CIT completed        06/03/2024    1:53 PM 11/03/2020    1:22 PM  6CIT Screen  What Year? 0 points 0 points  What month? 0 points 0 points  What time? 0 points   Count back from 20 0 points 0 points  Months in reverse 0 points 0 points  Repeat phrase 0 points 0 points  Total Score 0 points     Immunizations Immunization History  Administered Date(s) Administered   Fluad Quad(high Dose 65+) 06/04/2019, 06/09/2020   Fluzone Influenza virus vaccine,trivalent (IIV3), split virus 09/07/2013   Moderna Sars-Covid-2 Vaccination 12/01/2019, 12/29/2019, 08/22/2020, 03/27/2021   PNEUMOCOCCAL CONJUGATE-20 12/27/2023   Pneumococcal Conjugate-13 05/18/2015    Screening Tests Health Maintenance  Topic Date Due   DTaP/Tdap/Td (1 - Tdap) Never done   Zoster Vaccines- Shingrix (1 of 2) Never done   Influenza Vaccine  03/13/2024   COVID-19 Vaccine (5 - 2025-26 season) 04/13/2024   Medicare Annual Wellness (AWV)  06/03/2025   Pneumococcal Vaccine: 50+ Years  Completed   Meningococcal B Vaccine  Aged Out    Health Maintenance Items Addressed: Vaccines Due:  and discussed , See Nurse Notes at the end of this note  Additional Screening:  Vision Screening: Recommended annual ophthalmology exams for early detection of glaucoma and other disorders of the eye. Is the patient up to date with their annual eye exam?  No  Who is the provider or what is the name of the office in which the patient attends annual eye exams? Encouraged to follow up with provider   Dental Screening: Recommended annual dental exams for proper oral hygiene  Community Resource Referral / Chronic Care Management: CRR required this visit?  No   CCM required this visit?  No   Plan:    I have personally reviewed and noted the following in the patient's chart:   Medical and social history Use of alcohol, tobacco or illicit drugs  Current medications and supplements including opioid prescriptions. Patient is not currently taking opioid prescriptions. Functional ability and status Nutritional status Physical activity Advanced directives List of other physicians Hospitalizations, surgeries, and ER visits in previous 12 months Vitals Screenings to include cognitive, depression, and falls Referrals and appointments  In addition, I have reviewed and discussed with patient certain preventive protocols, quality metrics, and best practice recommendations. A written personalized  care plan for preventive services as well as general preventive health recommendations were provided to patient.   Ellouise VEAR Haws, LPN   [id number]/[id number]   After Visit Summary: (MyChart) Due to this being a telephonic visit, the after visit summary with patients personalized plan was offered to patient via MyChart   Notes: Nothing significant to report at this time.

## 2024-06-08 ENCOUNTER — Other Ambulatory Visit: Payer: Self-pay | Admitting: Cardiology

## 2024-06-08 DIAGNOSIS — I4811 Longstanding persistent atrial fibrillation: Secondary | ICD-10-CM

## 2024-06-08 DIAGNOSIS — Z7901 Long term (current) use of anticoagulants: Secondary | ICD-10-CM

## 2024-06-09 ENCOUNTER — Ambulatory Visit: Attending: Cardiology | Admitting: *Deleted

## 2024-06-09 DIAGNOSIS — I4811 Longstanding persistent atrial fibrillation: Secondary | ICD-10-CM

## 2024-06-09 DIAGNOSIS — Z7901 Long term (current) use of anticoagulants: Secondary | ICD-10-CM | POA: Diagnosis not present

## 2024-06-09 LAB — POCT INR: INR: 2.2 (ref 2.0–3.0)

## 2024-06-09 NOTE — Progress Notes (Signed)
 Description   INR-2.2; Continue taking warfarin 1 tablet daily except for 1/2 tablet on Monday and Fridays. Recheck INR in 6 weeks.  Anticoagulation Clinic [phone]

## 2024-06-09 NOTE — Patient Instructions (Addendum)
 Description   INR-2.2; Continue taking warfarin 1 tablet daily except for 1/2 tablet on Monday and Fridays. Recheck INR in 6 weeks.  Anticoagulation Clinic [phone]

## 2024-07-21 ENCOUNTER — Ambulatory Visit: Attending: Cardiology | Admitting: Pharmacist

## 2024-07-21 DIAGNOSIS — I4811 Longstanding persistent atrial fibrillation: Secondary | ICD-10-CM

## 2024-07-21 DIAGNOSIS — Z7901 Long term (current) use of anticoagulants: Secondary | ICD-10-CM

## 2024-07-21 LAB — POCT INR: INR: 2.9 (ref 2.0–3.0)

## 2024-07-21 NOTE — Progress Notes (Signed)
 Description   INR-2.9; Continue taking warfarin 1 tablet daily except for 1/2 tablet on Monday and Fridays. Recheck INR in 6 weeks.  Anticoagulation Clinic [phone]

## 2024-07-21 NOTE — Patient Instructions (Signed)
 Description   INR-2.9; Continue taking warfarin 1 tablet daily except for 1/2 tablet on Monday and Fridays. Recheck INR in 6 weeks.  Anticoagulation Clinic [phone]

## 2024-08-15 ENCOUNTER — Other Ambulatory Visit: Payer: Self-pay | Admitting: Cardiology

## 2024-08-15 DIAGNOSIS — Z7901 Long term (current) use of anticoagulants: Secondary | ICD-10-CM

## 2024-08-15 DIAGNOSIS — I4811 Longstanding persistent atrial fibrillation: Secondary | ICD-10-CM

## 2024-08-17 NOTE — Telephone Encounter (Signed)
 Warfarin 3mg  Dx-Afib Last INR Check-07/21/24 Last OV- 11/20/23

## 2024-08-18 ENCOUNTER — Telehealth (HOSPITAL_BASED_OUTPATIENT_CLINIC_OR_DEPARTMENT_OTHER): Payer: Self-pay

## 2024-08-18 DIAGNOSIS — Q211 Atrial septal defect, unspecified: Secondary | ICD-10-CM | POA: Insufficient documentation

## 2024-08-18 NOTE — Telephone Encounter (Signed)
" ° °  Name: Cameron Patton  DOB: 1946/08/11  MRN: [id number]  Primary Cardiologist: Lynwood Schilling, MD  Chart reviewed as part of pre-operative protocol coverage. Because of [name]'s past medical history and time since last visit, he will require a follow-up in-office visit in order to better assess preoperative cardiovascular risk.  Pre-op covering staff: - Please schedule appointment and call patient to inform them. If patient already had an upcoming appointment within acceptable timeframe, please add pre-op clearance to the appointment notes so provider is aware. - Please contact requesting surgeon's office via preferred method (i.e, phone, fax) to inform them of need for appointment prior to surgery.  This message will also be routed to pharmacy pool for input on holding Warfarin as requested below so that this information is available to the clearing provider at time of patient's appointment.   [name] Artur Winningham, NP  08/18/2024, 12:41 PM   "

## 2024-08-18 NOTE — Progress Notes (Unsigned)
 " Cardiology Office Note:   Date:  08/19/2024  ID:  Cameron Patton, DOB February 28, 1946, MRN [id number] PCP: Kennyth Worth HERO, MD  [facility] Providers Cardiologist:  Lynwood Schilling, MD Structural Heart:  Arun K Thukkani, MD{  History of Present Illness:   Cameron Patton is a 79 y.o. male  who presents for follow up of atrial fib.  He had severe MR.  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   EF is 60 - 65%.  He is status post mTEER on 10/25/23 reducing baseline 4+ mitral regurgitation to 1+ with placement of 3 Pascal ACE devices, positioned A3/P3, A2/P2, and lateral A2/P2. Final mean transmitral gradient 3 mmHg. POD1 echocardiogram showed normal LVEF at 55% with mildly elevated PASP at 44.28mmHg, with mild to moderate MR post implant and moderate TR with atrial shunting with two residual ASDs noted.   He had right heart in April 2025.  He had a low output Fick cardiac output of 2.2.  PA mean pressure was 44.  He had no evidence of a significant shunt with a Qp/Qs of close to 1.  His echocardiogram in March of last year demonstrated an EF of 55 to 60%.  He had severe left and right atrial dilatation.  He had moderate mitral regurgitation post clip.  He had moderate tricuspid regurgitation.  He actually has done pretty well.  He still drinks alcohol and continues to try to cut back.  He is going to have a hand surgery with minimally invasive release of some scar tissue that is causing contractures.  He is not having any new shortness of breath, PND or orthopnea.  He is not having any new palpitations, presyncope or syncope.  He had no weight gain or edema.  ROS: As stated in the HPI and negative for all other systems.  Studies Reviewed:    EKG:   EKG Interpretation Date/Time:  Wednesday August 19 2024 14:07:46 EST Ventricular Rate:  80 PR Interval:    QRS Duration:  88 QT Interval:  352 QTC Calculation: 405 R Axis:   -26  Text Interpretation: Atrial fibrillation Nonspecific ST and T wave abnormality  When compared with ECG of 26-Oct-2022 07:10, QRS axis Shifted left Confirmed by Schilling Rattan ([id number]) on 08/19/2024 2:20:29 PM   Risk Assessment/Calculations:    CHA2DS2-VASc Score =     This indicates a  % annual risk of stroke. The patient's score is based upon: HTN History: 1 Diabetes History: 0 Stroke History: 0 Vascular Disease History: 1 Age Score: 2 Gender Score: 0   Physical Exam:   VS:  BP 129/69 (BP Location: Left Arm, Patient Position: Sitting, Cuff Size: Normal)   Pulse 75   Resp 16   Ht 5' 9 (1.753 m)   Wt 146 lb (66.2 kg)   SpO2 98%   BMI 21.56 kg/m    Wt Readings from Last 3 Encounters:  08/19/24 146 lb (66.2 kg)  06/03/24 145 lb (65.8 kg)  02/06/24 141 lb (64 kg)     GEN: Well nourished, well developed in no acute distress NECK: No JVD; No carotid bruits CARDIAC: Irregular RR, 3 out of 6 systolic murmur holosystolic and at the third left intercostal space, no diastolic murmurs, rubs, gallops RESPIRATORY:  Clear to auscultation without rales, wheezing or rhonchi  ABDOMEN: Soft, non-tender, non-distended EXTREMITIES:  No edema; No deformity   ASSESSMENT AND PLAN:   Permanent atrial fib:    The patient tolerates anticoagulation.  He has good rate control.  No change in therapy.   MR:   He status post MitraClip with results as above.  I will follow-up with an echocardiogram in March to follow residual mitral and tricuspid regurgitation as well as the ASD's.   AORTIC PLAQUE:   He has had significant aortic plaque.  However, with his alcohol history and very reluctant to have him on a statin.  He had good profile with an LDL of 89 and an HDL of 70.9.  No change in therapy.    HTN: Blood pressure is at target.  No change in therapy.   PVCs: He does not feel these.  No change in therapy.  ETOH:   We have previously talked about the importance of complete cessation.  Preop: The patient is going to have release of some scar tissue from his hand which would be  minimally invasive.  He would be at acceptable risk for the planned procedure without further testing.  He is going to let us  know when that occurs so we can give him some guidance prior to that about holding his warfarin.     Follow up with me in 1 year  Signed, Lynwood Schilling, MD   "

## 2024-08-18 NOTE — Telephone Encounter (Signed)
 I have scheduled the pt to see Dr. Edison tomorrow 08/19/24 for preop clearance.

## 2024-08-18 NOTE — Telephone Encounter (Signed)
"  ° °  Pre-operative Risk Assessment    Patient Name: Cameron Patton  DOB: [birth year] MRN: [id number]   Date of last office visit: 11/20/2023 with Dr. 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 Bebe Lied Surgeon's Group or Practice Name:  [facility] Phone number:  [phone] Fax number:  [phone] or [phone]   Type of Clearance Requested:   - Medical  - Pharmacy:  Hold Warfarin (Coumadin ) -needs instructions   Type of Anesthesia:  Does not specify   Additional requests/questions:  None  Signed, Patrcia Iverson LITTIE   08/18/2024, 12:00 PM   "

## 2024-08-19 ENCOUNTER — Ambulatory Visit: Admitting: Cardiology

## 2024-08-19 ENCOUNTER — Encounter: Payer: Self-pay | Admitting: Cardiology

## 2024-08-19 VITALS — BP 129/69 | HR 75 | Resp 16 | Ht 69.0 in | Wt 146.0 lb

## 2024-08-19 DIAGNOSIS — I700 Atherosclerosis of aorta: Secondary | ICD-10-CM

## 2024-08-19 DIAGNOSIS — I10 Essential (primary) hypertension: Secondary | ICD-10-CM | POA: Diagnosis not present

## 2024-08-19 DIAGNOSIS — Q211 Atrial septal defect, unspecified: Secondary | ICD-10-CM | POA: Diagnosis not present

## 2024-08-19 DIAGNOSIS — Z9889 Other specified postprocedural states: Secondary | ICD-10-CM | POA: Diagnosis not present

## 2024-08-19 DIAGNOSIS — Z95818 Presence of other cardiac implants and grafts: Secondary | ICD-10-CM

## 2024-08-19 NOTE — Patient Instructions (Addendum)
 Medication Instructions:  Your physician recommends that you continue on your current medications as directed. Please refer to the Current Medication list given to you today.  *If you need a refill on your cardiac medications before your next appointment, please call your pharmacy*  Lab Work: NONE If you have labs (blood work) drawn today and your tests are completely normal, you will receive your results only by: MyChart Message (if you have MyChart) OR A paper copy in the mail If you have any lab test that is abnormal or we need to change your treatment, we will call you to review the results.  Testing/Procedures: Echocardiogram in March 2026 Your physician has requested that you have an echocardiogram. Echocardiography is a painless test that uses sound waves to create images of your heart. It provides your doctor with information about the size and shape of your heart and how well your hearts chambers and valves are working. This procedure takes approximately one hour. There are no restrictions for this procedure. Please do NOT wear cologne, perfume, aftershave, or lotions (deodorant is allowed). Please arrive 15 minutes prior to your appointment time.  Please note: We ask at that you not bring children with you during ultrasound (echo/ vascular) testing. Due to room size and safety concerns, children are not allowed in the ultrasound rooms during exams. Our front office staff cannot provide observation of children in our lobby area while testing is being conducted. An adult accompanying a patient to their appointment will only be allowed in the ultrasound room at the discretion of the ultrasound technician under special circumstances. We apologize for any inconvenience.   Follow-Up: At [facility], you and your health needs are our priority.  As part of our continuing mission to provide you with exceptional heart care, our providers are all part of one team.  This team includes  your primary Cardiologist (physician) and Advanced Practice Providers or APPs (Physician Assistants and Nurse Practitioners) who all work together to provide you with the care you need, when you need it.  Your next appointment:   1 year(s)  Provider:   Lynwood Schilling, MD    We recommend signing up for the patient portal called MyChart.  Sign up information is provided on this After Visit Summary.  MyChart is used to connect with patients for Virtual Visits (Telemedicine).  Patients are able to view lab/test results, encounter notes, upcoming appointments, etc.  Non-urgent messages can be sent to your provider as well.   To learn more about what you can do with MyChart, go to [email or web address].

## 2024-08-24 NOTE — Telephone Encounter (Signed)
 Patient with diagnosis of afib on warfarin for anticoagulation.    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 The patient's score is based upon: CHF History: 0 HTN History: 1 Diabetes History: 0 Stroke History: 0 Vascular Disease History: 1 Age Score: 2 Gender Score: 0      CrCl 65 ml/min Platelet count 202  Patient has not had an Afib/aflutter ablation in the last 3 months, DCCV within the last 4 weeks or a watchman implanted in the last 45 days   Do not feel anticoagulation needs to he held for this procedure, if MD requires hold then patient can hold warfarin for 3 days prior to procedure.    Patient will not need bridging with Lovenox  (enoxaparin ) around procedure.  **This guidance is not considered finalized until pre-operative APP has relayed final recommendations.**

## 2024-08-25 NOTE — Telephone Encounter (Signed)
"  ° °  Primary Cardiologist: Lynwood Schilling, MD  Chart reviewed as part of pre-operative protocol coverage. Given past medical history and time since last visit, based on ACC/AHA guidelines, Cameron Patton would be at acceptable risk for the planned procedure without further cardiovascular testing.   Patient should contact our office if he is having new symptoms that are concerning from a cardiac perspective to arrange a follow-up appointment.    Do not feel anticoagulation needs to he held for this procedure, if MD requires hold then patient can hold warfarin for 3 days prior to procedure.    Patient will not need bridging with Lovenox  (enoxaparin ) around procedure.  I will route this recommendation to the requesting party via Epic fax function and remove from pre-op pool.  Please call with questions.  Rosaline EMERSON Bane, NP-C 08/25/2024, 9:23 AM [address] [place], [place] [address] Office [phone] Fax [phone]    "

## 2024-09-01 ENCOUNTER — Ambulatory Visit: Payer: Self-pay | Admitting: *Deleted

## 2024-09-01 DIAGNOSIS — Z7901 Long term (current) use of anticoagulants: Secondary | ICD-10-CM | POA: Diagnosis not present

## 2024-09-01 DIAGNOSIS — I4811 Longstanding persistent atrial fibrillation: Secondary | ICD-10-CM | POA: Diagnosis not present

## 2024-09-01 LAB — POCT INR: INR: 3.3 — AB (ref 2.0–3.0)

## 2024-09-01 NOTE — Patient Instructions (Addendum)
 Description   INR-3.3; Do not take any warfarin today then continue taking warfarin 1 tablet daily except for 1/2 tablet on Monday and Fridays. Recheck INR in 1 week post procedure/warfarin hold.  [facility] [phone]       After procedure take an extra 1/2 tablet of warfarin for 2 days then resume normal dose.

## 2024-09-01 NOTE — Progress Notes (Signed)
 Description   INR-3.3; Do not take any warfarin today then continue taking warfarin 1 tablet daily except for 1/2 tablet on Monday and Fridays. Recheck INR in 1 week post procedure/warfarin hold.  [facility] [phone]

## 2024-09-25 ENCOUNTER — Ambulatory Visit

## 2024-10-13 ENCOUNTER — Ambulatory Visit

## 2024-10-13 ENCOUNTER — Ambulatory Visit (HOSPITAL_COMMUNITY)

## 2025-06-09 ENCOUNTER — Ambulatory Visit
